# Patient Record
Sex: Male | Born: 1960 | Race: Black or African American | Hispanic: No | Marital: Single | Smoking: Current every day smoker
Health system: Southern US, Community
[De-identification: ages and names within clinical notes are randomized; demographics above are authoritative.]

## PROBLEM LIST (undated history)

## (undated) DIAGNOSIS — M1990 Unspecified osteoarthritis, unspecified site: Secondary | ICD-10-CM

## (undated) DIAGNOSIS — E789 Disorder of lipoprotein metabolism, unspecified: Secondary | ICD-10-CM

## (undated) DIAGNOSIS — K219 Gastro-esophageal reflux disease without esophagitis: Secondary | ICD-10-CM

## (undated) DIAGNOSIS — F32A Depression, unspecified: Secondary | ICD-10-CM

## (undated) DIAGNOSIS — F17200 Nicotine dependence, unspecified, uncomplicated: Secondary | ICD-10-CM

## (undated) DIAGNOSIS — I10 Essential (primary) hypertension: Secondary | ICD-10-CM

## (undated) HISTORY — PX: EYE SURGERY: SHX253

---

## 2010-12-14 ENCOUNTER — Emergency Department (HOSPITAL_COMMUNITY)
Admission: EM | Admit: 2010-12-14 | Discharge: 2010-12-14 | Disposition: A | Discharge status: Home / Self Care | Payer: No Typology Code available for payment source | Attending: Emergency Medicine | Admitting: Emergency Medicine

## 2010-12-14 ENCOUNTER — Emergency Department (HOSPITAL_COMMUNITY): Payer: Self-pay

## 2010-12-14 DIAGNOSIS — W108XXA Fall (on) (from) other stairs and steps, initial encounter: Secondary | ICD-10-CM | POA: Insufficient documentation

## 2010-12-14 DIAGNOSIS — M25579 Pain in unspecified ankle and joints of unspecified foot: Secondary | ICD-10-CM | POA: Insufficient documentation

## 2010-12-14 DIAGNOSIS — M542 Cervicalgia: Secondary | ICD-10-CM | POA: Insufficient documentation

## 2010-12-14 DIAGNOSIS — M546 Pain in thoracic spine: Secondary | ICD-10-CM | POA: Insufficient documentation

## 2010-12-14 DIAGNOSIS — M545 Low back pain, unspecified: Secondary | ICD-10-CM | POA: Insufficient documentation

## 2010-12-14 DIAGNOSIS — M25539 Pain in unspecified wrist: Secondary | ICD-10-CM | POA: Insufficient documentation

## 2010-12-14 DIAGNOSIS — T07XXXA Unspecified multiple injuries, initial encounter: Secondary | ICD-10-CM | POA: Insufficient documentation

## 2010-12-14 DIAGNOSIS — Z79899 Other long term (current) drug therapy: Secondary | ICD-10-CM | POA: Insufficient documentation

## 2010-12-14 DIAGNOSIS — S139XXA Sprain of joints and ligaments of unspecified parts of neck, initial encounter: Secondary | ICD-10-CM | POA: Insufficient documentation

## 2010-12-14 DIAGNOSIS — I10 Essential (primary) hypertension: Secondary | ICD-10-CM | POA: Insufficient documentation

## 2010-12-14 DIAGNOSIS — Y9229 Other specified public building as the place of occurrence of the external cause: Secondary | ICD-10-CM | POA: Insufficient documentation

## 2010-12-14 DIAGNOSIS — R51 Headache: Secondary | ICD-10-CM | POA: Insufficient documentation

## 2011-11-28 ENCOUNTER — Emergency Department (HOSPITAL_COMMUNITY): Payer: Medicaid Other

## 2011-11-28 ENCOUNTER — Emergency Department (HOSPITAL_COMMUNITY)
Admission: EM | Admit: 2011-11-28 | Discharge: 2011-11-28 | Disposition: A | Discharge status: Home / Self Care | Payer: Medicaid Other | Attending: Emergency Medicine | Admitting: Emergency Medicine

## 2011-11-28 ENCOUNTER — Encounter (HOSPITAL_COMMUNITY): Payer: Self-pay | Admitting: Emergency Medicine

## 2011-11-28 DIAGNOSIS — F17200 Nicotine dependence, unspecified, uncomplicated: Secondary | ICD-10-CM | POA: Insufficient documentation

## 2011-11-28 DIAGNOSIS — I10 Essential (primary) hypertension: Secondary | ICD-10-CM | POA: Insufficient documentation

## 2011-11-28 DIAGNOSIS — Y93I9 Activity, other involving external motion: Secondary | ICD-10-CM | POA: Insufficient documentation

## 2011-11-28 DIAGNOSIS — Y998 Other external cause status: Secondary | ICD-10-CM | POA: Insufficient documentation

## 2011-11-28 DIAGNOSIS — S139XXA Sprain of joints and ligaments of unspecified parts of neck, initial encounter: Secondary | ICD-10-CM

## 2011-11-28 DIAGNOSIS — M25519 Pain in unspecified shoulder: Secondary | ICD-10-CM | POA: Insufficient documentation

## 2011-11-28 HISTORY — DX: Essential (primary) hypertension: I10

## 2011-11-28 MED ORDER — CYCLOBENZAPRINE HCL 10 MG PO TABS: 10.0000 mg | ORAL_TABLET | Freq: Two times a day (BID) | ORAL | Status: DC | PRN
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
## 2011-11-28 NOTE — ED Provider Notes (Signed)
Medical screening examination/treatment/procedure(s) were performed by non-physician practitioner and as supervising physician I was immediately available for consultation/collaboration.   Celene Kras, MD 11/28/11 [phone]
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
## 2013-02-09 ENCOUNTER — Emergency Department (HOSPITAL_COMMUNITY)
Admission: EM | Admit: 2013-02-09 | Discharge: 2013-02-09 | Disposition: A | Discharge status: Home / Self Care | Payer: Medicaid Other | Attending: Emergency Medicine | Admitting: Emergency Medicine

## 2013-02-09 ENCOUNTER — Encounter (HOSPITAL_COMMUNITY): Payer: Self-pay | Admitting: Emergency Medicine

## 2013-02-09 DIAGNOSIS — L259 Unspecified contact dermatitis, unspecified cause: Secondary | ICD-10-CM

## 2013-02-09 DIAGNOSIS — IMO0002 Reserved for concepts with insufficient information to code with codable children: Secondary | ICD-10-CM | POA: Insufficient documentation

## 2013-02-09 DIAGNOSIS — F17200 Nicotine dependence, unspecified, uncomplicated: Secondary | ICD-10-CM | POA: Insufficient documentation

## 2013-02-09 DIAGNOSIS — Z79899 Other long term (current) drug therapy: Secondary | ICD-10-CM | POA: Insufficient documentation

## 2013-02-09 DIAGNOSIS — Z792 Long term (current) use of antibiotics: Secondary | ICD-10-CM | POA: Insufficient documentation

## 2013-02-09 DIAGNOSIS — I10 Essential (primary) hypertension: Secondary | ICD-10-CM | POA: Insufficient documentation

## 2013-02-09 MED ORDER — PREDNISONE 20 MG PO TABS: 40.0000 mg | ORAL_TABLET | Freq: Every day | ORAL | Status: DC

## 2013-02-09 MED ORDER — DIPHENHYDRAMINE HCL 25 MG PO CAPS
25.0000 mg | ORAL_CAPSULE | Freq: Once | ORAL | Status: AC
  Administered 2013-02-09: 25 mg via ORAL
  Filled 2013-02-09: qty 1

## 2013-02-09 MED ORDER — FAMOTIDINE 20 MG PO TABS
20.0000 mg | ORAL_TABLET | Freq: Once | ORAL | Status: AC
  Administered 2013-02-09: 20 mg via ORAL
  Filled 2013-02-09: qty 1

## 2013-02-09 MED ORDER — DIPHENHYDRAMINE HCL 25 MG PO TABS: 25.0000 mg | ORAL_TABLET | Freq: Four times a day (QID) | ORAL | Status: DC | PRN

## 2013-02-09 MED ORDER — PREDNISONE 20 MG PO TABS
60.0000 mg | ORAL_TABLET | Freq: Once | ORAL | Status: AC
  Administered 2013-02-09: 60 mg via ORAL
  Filled 2013-02-09: qty 3

## 2013-02-09 MED ORDER — HYDROCORTISONE 2.5 % EX LOTN: TOPICAL_LOTION | Freq: Two times a day (BID) | CUTANEOUS | Status: DC

## 2013-02-09 MED ORDER — CEPHALEXIN 500 MG PO CAPS: 500.0000 mg | ORAL_CAPSULE | Freq: Four times a day (QID) | ORAL | Status: DC

## 2013-02-09 MED ORDER — FAMOTIDINE 20 MG PO TABS: 20.0000 mg | ORAL_TABLET | Freq: Two times a day (BID) | ORAL | Status: DC

## 2013-02-09 NOTE — ED Notes (Signed)
Pt states he began to break out 6 days ago, after using some just for men dye. Pt states he had some burning and itching that has now spread to his neck, arms, and chest. Visible redness and hives noted. Pt states he has tried benadryl, hydrocortisone cream, and calamine lotion but nothing has relieved his sx.

## 2013-02-09 NOTE — ED Notes (Signed)
PT ambulated with baseline gait; VSS; A&Ox3; no signs of distress; respirations even and unlabored; skin warm and dry; no questions upon discharge.  

## 2013-02-09 NOTE — ED Provider Notes (Signed)
CSN: [id number]     Arrival date & time 02/09/13  1436 History  This chart was scribed for non-physician practitioner, Dierdre Forth, PA-C working with Raeford Razor, MD by Greggory Stallion, ED scribe. This patient was seen in room TR04C/TR04C and the patient's care was started at 3:50 PM.   Chief Complaint  Patient presents with  . Rash   The history is provided by the patient. No language interpreter was used.   HPI Comments: Jonathan Carney is a 52 y.o. male who presents to the Emergency Department complaining of worsening, itchy rash that started 4-5 days ago. He states it started on his face then spread to his neck, arms, abdomen and flanks. Pt states he recently started using Just For Men soap and used it for the first time 1 day before the rash began and each day he has used it the rash has worsened. He has used Benadryl cream, hydrocortisone cream and calamine lotion with no relief.   He denies fever, chills, nausea, vomiting, feeling of throat swelling, shortness of breath, difficulty breathing or wheezing.  Past Medical History  Diagnosis Date  . Hypertension    Past Surgical History  Procedure Laterality Date  . Eye surgery  to straighten it out    Right   No family history on file. History  Substance Use Topics  . Smoking status: Current Every Day Smoker -- 0.25 packs/day  . Smokeless tobacco: Not on file  . Alcohol Use: No    Review of Systems  Constitutional: Negative for fever, diaphoresis, appetite change, fatigue and unexpected weight change.  HENT: Negative for mouth sores.   Eyes: Negative for visual disturbance.  Respiratory: Negative for cough, chest tightness, shortness of breath and wheezing.   Cardiovascular: Negative for chest pain.  Gastrointestinal: Negative for nausea, vomiting, abdominal pain, diarrhea and constipation.  Endocrine: Negative for polydipsia, polyphagia and polyuria.  Genitourinary: Negative for dysuria, urgency, frequency and  hematuria.  Musculoskeletal: Negative for back pain and neck stiffness.  Skin: Positive for rash.  Allergic/Immunologic: Negative for immunocompromised state.  Neurological: Negative for syncope, light-headedness and headaches.  Hematological: Does not bruise/bleed easily.  Psychiatric/Behavioral: Negative for sleep disturbance. The patient is not nervous/anxious.     Allergies  Review of patient's allergies indicates no known allergies.  Home Medications   Current Outpatient Rx  Name  Route  Sig  Dispense  Refill  . ibuprofen (ADVIL,MOTRIN) 200 MG tablet   Oral   Take 600 mg by mouth every 6 (six) hours as needed for headache.         . cephALEXin (KEFLEX) 500 MG capsule   Oral   Take 1 capsule (500 mg total) by mouth 4 (four) times daily.   40 capsule   0   . diphenhydrAMINE (BENADRYL) 25 MG tablet   Oral   Take 1 tablet (25 mg total) by mouth every 6 (six) hours as needed for itching (Rash).   20 tablet   0   . famotidine (PEPCID) 20 MG tablet   Oral   Take 1 tablet (20 mg total) by mouth 2 (two) times daily.   10 tablet   0   . hydrocortisone 2.5 % lotion   Topical   Apply topically 2 (two) times daily.   59 mL   0   . predniSONE (DELTASONE) 20 MG tablet   Oral   Take 2 tablets (40 mg total) by mouth daily.   10 tablet   0    BP  122/77  Pulse 85  Temp(Src) 99.5 F (37.5 C) (Oral)  Resp 16  SpO2 97%  Physical Exam  Nursing note and vitals reviewed. Constitutional: He appears well-developed and well-nourished. No distress.  Awake, alert, nontoxic appearance  HENT:  Head: Normocephalic and atraumatic.  Mouth/Throat: Uvula is midline and oropharynx is clear and moist. No uvula swelling. No oropharyngeal exudate, posterior oropharyngeal edema, posterior oropharyngeal erythema or tonsillar abscesses.  No visible swelling of the oropharyngeal tissues or uvula Uvula is midline  Eyes: Conjunctivae are normal. No scleral icterus.  Neck: Normal range of  motion. Neck supple.  Patent airway Handling secretions No stridor  Cardiovascular: Normal rate, regular rhythm, normal heart sounds and intact distal pulses.   Pulmonary/Chest: Effort normal and breath sounds normal. No respiratory distress. He has no wheezes.  Clear and equal breath sounds without wheezing  Abdominal: Soft. Bowel sounds are normal. He exhibits no mass. There is no tenderness. There is no rebound and no guarding.  Musculoskeletal: Normal range of motion. He exhibits no edema.  Neurological: He is alert.  Speech is clear and goal oriented Moves extremities without ataxia  Skin: Skin is warm and dry. Rash noted. He is not diaphoretic. There is erythema.  Erythematous, raised papular rash with wheals over the bilateral cheeks, neck and bilateral axilla. Excoriated throughout. There is a 1 x 1 cm area of induration to the left anterior neck which is draining without an area of fluctuance.  Psychiatric: He has a normal mood and affect. His behavior is normal.    ED Course  Procedures (including critical care time)  DIAGNOSTIC STUDIES: Oxygen Saturation is 97% on RA, normal by my interpretation.    COORDINATION OF CARE: 3:54 PM-Discussed treatment plan which includes Benadryl, pepcid and prednisone with pt at bedside and pt agreed to plan.   Labs Review Labs Reviewed - No data to display Imaging Review No results found.  EKG Interpretation   None       MDM   1. Contact dermatitis      Amedee Mah presents with history and physical consistent with contact dermatitis.  Rash consistent with contact dermatitis. Patient denies any difficulty breathing or swallowing.  Pt has a patent airway without stridor and is handling secretions without difficulty; no angioedema. No concern for superimposed infection. No concern for SJS, TEN, TSS, tick borne illness, syphilis or other life-threatening condition. Will discharge home with short course of steroids and recommend  Benadryl as needed for pruritis.  It has been determined that no acute conditions requiring further emergency intervention are present at this time. The patient/guardian have been advised of the diagnosis and plan. We have discussed signs and symptoms that warrant return to the ED, such as changes or worsening in symptoms.   Vital signs are stable at discharge.   BP 122/77  Pulse 85  Temp(Src) 99.5 F (37.5 C) (Oral)  Resp 16  SpO2 97%  Patient/guardian has voiced understanding and agreed to follow-up with the PCP or specialist.       Dierdre Forth, PA-C 02/09/13 1655

## 2013-02-09 NOTE — ED Notes (Signed)
Pt c/o rash starting on face and spreading to arms and abdomen after dying beard.

## 2013-02-15 NOTE — ED Provider Notes (Signed)
Medical screening examination/treatment/procedure(s) were performed by non-physician practitioner and as supervising physician I was immediately available for consultation/collaboration.  EKG Interpretation   None        Kelilah Hebard, MD 02/15/13 1337 

## 2013-02-26 ENCOUNTER — Emergency Department (HOSPITAL_COMMUNITY)
Admission: EM | Admit: 2013-02-26 | Discharge: 2013-02-26 | Disposition: A | Discharge status: Home / Self Care | Payer: Medicaid Other | Attending: Emergency Medicine | Admitting: Emergency Medicine

## 2013-02-26 ENCOUNTER — Encounter (HOSPITAL_COMMUNITY): Payer: Self-pay | Admitting: Emergency Medicine

## 2013-02-26 ENCOUNTER — Emergency Department (HOSPITAL_COMMUNITY): Payer: Medicaid Other

## 2013-02-26 DIAGNOSIS — S46909A Unspecified injury of unspecified muscle, fascia and tendon at shoulder and upper arm level, unspecified arm, initial encounter: Secondary | ICD-10-CM | POA: Insufficient documentation

## 2013-02-26 DIAGNOSIS — I10 Essential (primary) hypertension: Secondary | ICD-10-CM | POA: Insufficient documentation

## 2013-02-26 DIAGNOSIS — Y92410 Unspecified street and highway as the place of occurrence of the external cause: Secondary | ICD-10-CM | POA: Insufficient documentation

## 2013-02-26 DIAGNOSIS — IMO0002 Reserved for concepts with insufficient information to code with codable children: Secondary | ICD-10-CM | POA: Insufficient documentation

## 2013-02-26 DIAGNOSIS — Z792 Long term (current) use of antibiotics: Secondary | ICD-10-CM | POA: Insufficient documentation

## 2013-02-26 DIAGNOSIS — Z79899 Other long term (current) drug therapy: Secondary | ICD-10-CM | POA: Insufficient documentation

## 2013-02-26 DIAGNOSIS — S4980XA Other specified injuries of shoulder and upper arm, unspecified arm, initial encounter: Secondary | ICD-10-CM | POA: Insufficient documentation

## 2013-02-26 DIAGNOSIS — M549 Dorsalgia, unspecified: Secondary | ICD-10-CM

## 2013-02-26 DIAGNOSIS — F17200 Nicotine dependence, unspecified, uncomplicated: Secondary | ICD-10-CM | POA: Insufficient documentation

## 2013-02-26 DIAGNOSIS — M542 Cervicalgia: Secondary | ICD-10-CM

## 2013-02-26 DIAGNOSIS — Y9389 Activity, other specified: Secondary | ICD-10-CM | POA: Insufficient documentation

## 2013-02-26 DIAGNOSIS — S0993XA Unspecified injury of face, initial encounter: Secondary | ICD-10-CM | POA: Insufficient documentation

## 2013-02-26 MED ORDER — TRAMADOL HCL 50 MG PO TABS: 50.0000 mg | ORAL_TABLET | Freq: Four times a day (QID) | ORAL | Status: DC | PRN

## 2013-02-26 MED ORDER — METHOCARBAMOL 500 MG PO TABS
500.0000 mg | ORAL_TABLET | Freq: Two times a day (BID) | ORAL | Status: DC
Start: 2013-02-26 — End: 2015-02-01

## 2013-02-26 MED ORDER — METHOCARBAMOL 500 MG PO TABS
500.0000 mg | ORAL_TABLET | Freq: Once | ORAL | Status: AC
  Administered 2013-02-26: 500 mg via ORAL
  Filled 2013-02-26: qty 1

## 2013-02-26 MED ORDER — TRAMADOL HCL 50 MG PO TABS
50.0000 mg | ORAL_TABLET | Freq: Once | ORAL | Status: AC
  Administered 2013-02-26: 50 mg via ORAL
  Filled 2013-02-26: qty 1

## 2013-02-26 MED ORDER — NAPROXEN 500 MG PO TABS: 500.0000 mg | ORAL_TABLET | Freq: Two times a day (BID) | ORAL | Status: DC
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
## 2013-03-05 NOTE — ED Provider Notes (Signed)
Medical screening examination/treatment/procedure(s) were performed by non-physician practitioner and as supervising physician I was immediately available for consultation/collaboration.   Oliver Neuwirth L Jazsmine Macari, MD 03/05/13 2059 

## 2013-12-13 ENCOUNTER — Emergency Department (HOSPITAL_COMMUNITY)
Admission: EM | Admit: 2013-12-13 | Discharge: 2013-12-13 | Disposition: A | Discharge status: Home / Self Care | Payer: Medicaid Other | Source: Home / Self Care | Attending: Family Medicine | Admitting: Family Medicine

## 2013-12-13 ENCOUNTER — Encounter (HOSPITAL_COMMUNITY): Payer: Self-pay | Admitting: Emergency Medicine

## 2013-12-13 DIAGNOSIS — B86 Scabies: Secondary | ICD-10-CM

## 2013-12-13 MED ORDER — PERMETHRIN 5 % EX CREA: TOPICAL_CREAM | CUTANEOUS | Status: DC
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
## 2013-12-13 NOTE — ED Notes (Signed)
States he and his lady friend spent night in a local hotel over the weekend, and since then has noticed several irritated areas on his skin, concerned about bug bites

## 2013-12-13 NOTE — Discharge Instructions (Signed)

## 2013-12-15 NOTE — ED Provider Notes (Signed)
Medical screening examination/treatment/procedure(s) were performed by a resident physician or non-physician practitioner and as the supervising physician I was immediately available for consultation/collaboration.  Mariaguadalupe Fialkowski, MD    Braxon Suder S Akaysha Cobern, MD 12/15/13 0818 

## 2014-09-06 ENCOUNTER — Encounter (HOSPITAL_COMMUNITY): Payer: Self-pay | Admitting: Emergency Medicine

## 2014-09-06 ENCOUNTER — Emergency Department (HOSPITAL_COMMUNITY)
Admission: EM | Admit: 2014-09-06 | Discharge: 2014-09-06 | Disposition: A | Discharge status: Home / Self Care | Payer: Medicaid Other | Attending: Emergency Medicine | Admitting: Emergency Medicine

## 2014-09-06 DIAGNOSIS — Z791 Long term (current) use of non-steroidal anti-inflammatories (NSAID): Secondary | ICD-10-CM | POA: Insufficient documentation

## 2014-09-06 DIAGNOSIS — Z720 Tobacco use: Secondary | ICD-10-CM | POA: Diagnosis not present

## 2014-09-06 DIAGNOSIS — H1033 Unspecified acute conjunctivitis, bilateral: Secondary | ICD-10-CM | POA: Diagnosis not present

## 2014-09-06 DIAGNOSIS — I10 Essential (primary) hypertension: Secondary | ICD-10-CM | POA: Diagnosis not present

## 2014-09-06 DIAGNOSIS — Z7982 Long term (current) use of aspirin: Secondary | ICD-10-CM | POA: Insufficient documentation

## 2014-09-06 DIAGNOSIS — Z7952 Long term (current) use of systemic steroids: Secondary | ICD-10-CM | POA: Insufficient documentation

## 2014-09-06 DIAGNOSIS — H5712 Ocular pain, left eye: Secondary | ICD-10-CM | POA: Diagnosis present

## 2014-09-06 DIAGNOSIS — Z79899 Other long term (current) drug therapy: Secondary | ICD-10-CM | POA: Diagnosis not present

## 2014-09-06 MED ORDER — NEOMYCIN-POLYMYXIN-DEXAMETH 3.5-10000-0.1 OP OINT
TOPICAL_OINTMENT | Freq: Four times a day (QID) | OPHTHALMIC | Status: DC
  Administered 2014-09-06: 11:00:00 via OPHTHALMIC
  Filled 2014-09-06: qty 3.5

## 2014-09-06 MED ORDER — FLUORESCEIN SODIUM 1 MG OP STRP
1.0000 | ORAL_STRIP | Freq: Once | OPHTHALMIC | Status: AC
  Administered 2014-09-06: 1 via OPHTHALMIC
  Filled 2014-09-06: qty 1

## 2014-09-06 MED ORDER — TETRACAINE HCL 0.5 % OP SOLN
2.0000 [drp] | Freq: Once | OPHTHALMIC | Status: AC
  Administered 2014-09-06: 2 [drp] via OPHTHALMIC
  Filled 2014-09-06: qty 2

## 2014-09-06 NOTE — ED Provider Notes (Signed)
CSN: [id number]     Arrival date & time 09/06/14  0841 History  This chart was scribed for non-physician practitioner Jaynie Crumble, PA-C working with Purvis Sheffield, MD by Littie Deeds, ED Scribe. This patient was seen in room TR04C/TR04C and the patient's care was started at 9:13 AM.     Chief Complaint  Patient presents with  . Conjunctivitis   HPI  HPI Comments: Jonathan Carney is a 54 y.o. male who presents to the Emergency Department complaining of left eye pain with associated erythema, itchiness, and drainage that started about a week ago. Patient notes his right eye started having similar symptoms a few days ago. He has been waking up with crusty eye discharge. He reports having associated blurred vision. Patient has been using visine eye drops. He believes he may have had chemical exposure to his eyes; he sprays his car with [employer] Hot [employer]ine every day, and his eyes may have been exposed to some of the mist spray.   Past Medical History  Diagnosis Date  . Hypertension    Past Surgical History  Procedure Laterality Date  . Eye surgery  to straighten it out    Right   No family history on file. History  Substance Use Topics  . Smoking status: Current Every Day Smoker -- 0.50 packs/day    Types: Cigarettes  . Smokeless tobacco: Not on file  . Alcohol Use: No    Review of Systems  Eyes: Positive for pain, discharge, redness, itching and visual disturbance.      Allergies  Review of patient's allergies indicates no known allergies.  Home Medications   Prior to Admission medications   Medication Sig Start Date End Date Taking? Authorizing Provider  aspirin 325 MG tablet Take 325 mg by mouth daily as needed for headache. Patient took 3 tabs today 02/26/13    Historical Provider, MD  diphenhydrAMINE (BENADRYL) 25 MG tablet Take 1 tablet (25 mg total) by mouth every 6 (six) hours as needed for itching (Rash). 02/09/13   Hannah Muthersbaugh, PA-C  famotidine (PEPCID)  20 MG tablet Take 1 tablet (20 mg total) by mouth 2 (two) times daily. 02/09/13   Hannah Muthersbaugh, PA-C  hydrocortisone 2.5 % lotion Apply topically 2 (two) times daily. 02/09/13   Hannah Muthersbaugh, PA-C  methocarbamol (ROBAXIN) 500 MG tablet Take 1 tablet (500 mg total) by mouth 2 (two) times daily. 02/26/13   Santiago Glad, PA-C  Multiple Vitamin (MULTIVITAMIN WITH MINERALS) TABS tablet Take 1 tablet by mouth daily.    Historical Provider, MD  naproxen (NAPROSYN) 500 MG tablet Take 1 tablet (500 mg total) by mouth 2 (two) times daily. 02/26/13   Heather Laisure, PA-C  permethrin (ELIMITE) 5 % cream Apply to affected area once, leave 8 hours then shower Repeat treatment in one week. 12/13/13   Mathis Fare Presson, PA  predniSONE (DELTASONE) 20 MG tablet Take 2 tablets (40 mg total) by mouth daily. 02/09/13   Hannah Muthersbaugh, PA-C  traMADol (ULTRAM) 50 MG tablet Take 1 tablet (50 mg total) by mouth every 6 (six) hours as needed. 02/26/13   Heather Laisure, PA-C   BP 138/91 mmHg  Pulse 82  Temp(Src) 98.3 F (36.8 C) (Oral)  Resp 20  SpO2 98% Physical Exam  Constitutional: He is oriented to person, place, and time. He appears well-developed and well-nourished. No distress.  HENT:  Head: Normocephalic and atraumatic.  Mouth/Throat: Oropharynx is clear and moist. No oropharyngeal exudate.  Eyes: EOM are normal. Pupils  are equal, round, and reactive to light. Right eye exhibits discharge. Right eye exhibits no hordeolum. No foreign body present in the right eye. Left eye exhibits discharge. Left eye exhibits no hordeolum. No foreign body present in the left eye.  Slit lamp exam:      The right eye shows no corneal abrasion, no hyphema and no fluorescein uptake.       The left eye shows no corneal abrasion, no hyphema and no fluorescein uptake.  Conjunctivae injected bilaterally, pupils are equal round reactive. No sensitivity to light. Watery/purulent discharge bilaterally  Neck:  Neck supple.  Cardiovascular: Normal rate.   Pulmonary/Chest: Effort normal.  Musculoskeletal: He exhibits no edema.  Neurological: He is alert and oriented to person, place, and time. No cranial nerve deficit.  Skin: Skin is warm and dry. No rash noted.  Psychiatric: He has a normal mood and affect. His behavior is normal.  Nursing note and vitals reviewed.   ED Course  Procedures  DIAGNOSTIC STUDIES: Oxygen Saturation is 98% on room air, normal by my interpretation.    COORDINATION OF CARE: 9:16 AM-Will administer fluorescein eye stain and have visual acuity screening performed.   Labs Review Labs Reviewed - No data to display  Imaging Review No results found.   EKG Interpretation None      MDM   Final diagnoses:  Conjunctivitis, acute, bilateral   patient with bilateral conjunctivitis, viral versus bacterial. No fluorescein uptake, corneal abrasions, corneal ulcers on fluorescein staining. I appears otherwise normal. There is no consensual or direct photophobia. Less likely conjunctivitis. Will treat with antibiotics drops, follow-up as needed. Patient requested follow-up with ophthalmology, stated that he needs to get his eyes checked because he cannot see as well. His visual acuities 20/30 here.  Filed Vitals:   09/06/14 0847  BP: 138/91  Pulse: 82  Temp: 98.3 F (36.8 C)  TempSrc: Oral  Resp: 20  SpO2: 98%   I personally performed the services described in this documentation, which was scribed in my presence. The recorded information has been reviewed and is accurate.    Jaynie Crumbleatyana Juanjesus Pepperman, PA-C 09/06/14 1003  Purvis SheffieldForrest Harrison, MD 09/06/14 1558

## 2014-09-06 NOTE — ED Notes (Signed)
Waiting on medicine from pharmacy.   Notified pharmacy that we need the medicine.

## 2014-09-06 NOTE — Discharge Instructions (Signed)
Tylenol or motrin for pain. Use neomycin drops every 6 hrs. Follow up with primary care doctor. Return if worsening.    Conjunctivitis Conjunctivitis is commonly called "pink eye." Conjunctivitis can be caused by bacterial or viral infection, allergies, or injuries. There is usually redness of the lining of the eye, itching, discomfort, and sometimes discharge. There may be deposits of matter along the eyelids. A viral infection usually causes a watery discharge, while a bacterial infection causes a yellowish, thick discharge. Pink eye is very contagious and spreads by direct contact. You may be given antibiotic eyedrops as part of your treatment. Before using your eye medicine, remove all drainage from the eye by washing gently with warm water and cotton balls. Continue to use the medication until you have awakened 2 mornings in a row without discharge from the eye. Do not rub your eye. This increases the irritation and helps spread infection. Use separate towels from other household members. Wash your hands with soap and water before and after touching your eyes. Use cold compresses to reduce pain and sunglasses to relieve irritation from light. Do not wear contact lenses or wear eye makeup until the infection is gone. SEEK MEDICAL CARE IF:   Your symptoms are not better after 3 days of treatment.  You have increased pain or trouble seeing.  The outer eyelids become very red or swollen. Document Released: 04/02/2004 Document Revised: 05/18/2011 Document Reviewed: 02/23/2005 [facility] Patient Information 2015 [place], [place]. This information is not intended to replace advice given to you by your health care provider. Make sure you discuss any questions you have with your health care provider.

## 2014-09-06 NOTE — ED Notes (Signed)
Patient still waiting in room for drops.   Pharmacy called again, tube station problems.   I asked if they would bring and they said they would as soon as someone available to bring.

## 2014-09-06 NOTE — ED Notes (Signed)
Patient states eye redness and drainage in L eye started 1 wk ago.   Patient states R eye started a day or two ago.

## 2015-02-01 ENCOUNTER — Encounter (HOSPITAL_COMMUNITY): Payer: Self-pay | Admitting: Emergency Medicine

## 2015-02-01 ENCOUNTER — Emergency Department (HOSPITAL_COMMUNITY)
Admission: EM | Admit: 2015-02-01 | Discharge: 2015-02-01 | Disposition: A | Discharge status: Home / Self Care | Payer: Medicaid Other | Attending: Emergency Medicine | Admitting: Emergency Medicine

## 2015-02-01 DIAGNOSIS — Z791 Long term (current) use of non-steroidal anti-inflammatories (NSAID): Secondary | ICD-10-CM | POA: Diagnosis not present

## 2015-02-01 DIAGNOSIS — Z79899 Other long term (current) drug therapy: Secondary | ICD-10-CM | POA: Diagnosis not present

## 2015-02-01 DIAGNOSIS — R21 Rash and other nonspecific skin eruption: Secondary | ICD-10-CM | POA: Diagnosis present

## 2015-02-01 DIAGNOSIS — L259 Unspecified contact dermatitis, unspecified cause: Secondary | ICD-10-CM | POA: Diagnosis not present

## 2015-02-01 DIAGNOSIS — F17210 Nicotine dependence, cigarettes, uncomplicated: Secondary | ICD-10-CM | POA: Diagnosis not present

## 2015-02-01 DIAGNOSIS — M65312 Trigger thumb, left thumb: Secondary | ICD-10-CM | POA: Diagnosis not present

## 2015-02-01 DIAGNOSIS — Z7982 Long term (current) use of aspirin: Secondary | ICD-10-CM | POA: Insufficient documentation

## 2015-02-01 DIAGNOSIS — I10 Essential (primary) hypertension: Secondary | ICD-10-CM | POA: Diagnosis not present

## 2015-02-01 DIAGNOSIS — Z7952 Long term (current) use of systemic steroids: Secondary | ICD-10-CM | POA: Diagnosis not present

## 2015-02-01 MED ORDER — LORAZEPAM 1 MG PO TABS: 1.0000 mg | ORAL_TABLET | Freq: Every evening | ORAL | Status: DC | PRN

## 2015-02-01 MED ORDER — PREDNISONE 20 MG PO TABS
60.0000 mg | ORAL_TABLET | Freq: Once | ORAL | Status: AC
  Administered 2015-02-01: 60 mg via ORAL
  Filled 2015-02-01: qty 3

## 2015-02-01 MED ORDER — LORATADINE 10 MG PO TABS: 10.0000 mg | ORAL_TABLET | Freq: Every day | ORAL | Status: DC

## 2015-02-01 MED ORDER — PREDNISONE 50 MG PO TABS: 50.0000 mg | ORAL_TABLET | Freq: Every day | ORAL | Status: DC

## 2015-02-01 NOTE — Discharge Instructions (Signed)
Take diphenhydramine (Benadryl) as needed for itching. Do not ever use [employer] again. If you use anything to color your hair/beard, check to make sure it does not have the same ingredients as [employer].  Contact Dermatitis Dermatitis is redness, soreness, and swelling (inflammation) of the skin. Contact dermatitis is a reaction to certain substances that touch the skin. There are two types of contact dermatitis:   Irritant contact dermatitis. This type is caused by something that irritates your skin, such as dry hands from washing them too much. This type does not require previous exposure to the substance for a reaction to occur. This type is more common.  Allergic contact dermatitis. This type is caused by a substance that you are allergic to, such as a nickel allergy or poison ivy. This type only occurs if you have been exposed to the substance (allergen) before. Upon a repeat exposure, your body reacts to the substance. This type is less common. CAUSES  Many different substances can cause contact dermatitis. Irritant contact dermatitis is most commonly caused by exposure to:   Makeup.   Soaps.   Detergents.   Bleaches.   Acids.   Metal salts, such as nickel.  Allergic contact dermatitis is most commonly caused by exposure to:   Poisonous plants.   Chemicals.   Jewelry.   Latex.   Medicines.   Preservatives in products, such as clothing.  RISK FACTORS This condition is more likely to develop in:   People who have jobs that expose them to irritants or allergens.  People who have certain medical conditions, such as asthma or eczema.  SYMPTOMS  Symptoms of this condition may occur anywhere on your body where the irritant has touched you or is touched by you. Symptoms include:  Dryness or flaking.   Redness.   Cracks.   Itching.   Pain or a burning feeling.   Blisters.  Drainage of small amounts of blood or clear fluid from skin  cracks. With allergic contact dermatitis, there may also be swelling in areas such as the eyelids, mouth, or genitals.  DIAGNOSIS  This condition is diagnosed with a medical history and physical exam. A patch skin test may be performed to help determine the cause. If the condition is related to your job, you may need to see an occupational medicine specialist. TREATMENT Treatment for this condition includes figuring out what caused the reaction and protecting your skin from further contact. Treatment may also include:   Steroid creams or ointments. Oral steroid medicines may be needed in more severe cases.  Antibiotics or antibacterial ointments, if a skin infection is present.  Antihistamine lotion or an antihistamine taken by mouth to ease itching.  A bandage (dressing). HOME CARE INSTRUCTIONS Skin Care  Moisturize your skin as needed.   Apply cool compresses to the affected areas.  Try taking a bath with:  Epsom salts. Follow the instructions on the packaging. You can get these at your local pharmacy or grocery store.  Baking soda. Pour a small amount into the bath as directed by your health care provider.  Colloidal oatmeal. Follow the instructions on the packaging. You can get this at your local pharmacy or grocery store.  Try applying baking soda paste to your skin. Stir water into baking soda until it reaches a paste-like consistency.  Do not scratch your skin.  Bathe less frequently, such as every other day.  Bathe in lukewarm water. Avoid using hot water. Medicines  Take or  apply over-the-counter and prescription medicines only as told by your health care provider.   If you were prescribed an antibiotic medicine, take or apply your antibiotic as told by your health care provider. Do not stop using the antibiotic even if your condition starts to improve. General Instructions  Keep all follow-up visits as told by your health care provider. This is  important.  Avoid the substance that caused your reaction. If you do not know what caused it, keep a journal to try to track what caused it. Write down:  What you eat.  What cosmetic products you use.  What you drink.  What you wear in the affected area. This includes jewelry.  If you were given a dressing, take care of it as told by your health care provider. This includes when to change and remove it. SEEK MEDICAL CARE IF:   Your condition does not improve with treatment.  Your condition gets worse.  You have signs of infection such as swelling, tenderness, redness, soreness, or warmth in the affected area.  You have a fever.  You have new symptoms. SEEK IMMEDIATE MEDICAL CARE IF:   You have a severe headache, neck pain, or neck stiffness.  You vomit.  You feel very sleepy.  You notice red streaks coming from the affected area.  Your bone or joint underneath the affected area becomes painful after the skin has healed.  The affected area turns darker.  You have difficulty breathing.   This information is not intended to replace advice given to you by your health care provider. Make sure you discuss any questions you have with your health care provider.   Document Released: 02/21/2000 Document Revised: 11/14/2014 Document Reviewed: 07/11/2014 Elsevier Interactive Patient Education 2016 [employer]  Trigger Finger Trigger finger (digital tendinitis and stenosing tenosynovitis) is a common disorder that causes an often painful catching of the fingers or thumb. It occurs as a clicking, snapping, or locking of a finger in the palm of the hand. This is caused by a problem with the tendons that flex or bend the fingers sliding smoothly through their sheaths. The condition may occur in any finger or a couple fingers at the same time.  The finger may lock with the finger curled or suddenly straighten out with a snap. This is more common in patients with rheumatoid arthritis  and diabetes. Left untreated, the condition may get worse to the point where the finger becomes locked in flexion, like making a fist, or less commonly locked with the finger straightened out. CAUSES   Inflammation and scarring that lead to swelling around the tendon sheath.  Repeated or forceful movements.  Rheumatoid arthritis, an autoimmune disease that affects joints.  Gout.  Diabetes mellitus. SIGNS AND SYMPTOMS  Soreness and swelling of your finger.  A painful clicking or snapping as you bend and straighten your finger. DIAGNOSIS  Your health care provider will do a physical exam of your finger to diagnose trigger finger. TREATMENT   Splinting for 6-8 weeks may be helpful.  Nonsteroidal anti-inflammatory medicines (NSAIDs) can help to relieve the pain and inflammation.  Cortisone injections, along with splinting, may speed up recovery. Several injections may be required. Cortisone may give relief after one injection.  Surgery is another treatment that may be used if conservative treatments do not work. Surgery can be minor, without incisions (a cut does not have to be made), and can be done with a needle through the skin.  Other surgical choices involve an  open procedure in which the surgeon opens the hand through a small incision and cuts the pulley so the tendon can again slide smoothly. Your hand will still work fine. HOME CARE INSTRUCTIONS  Apply ice to the injured area, twice per day:  Put ice in a plastic bag.  Place a towel between your skin and the bag.  Leave the ice on for 20 minutes, 3-4 times a day.  Rest your hand often. MAKE SURE YOU:   Understand these instructions.  Will watch your condition.  Will get help right away if you are not doing well or get worse.   This information is not intended to replace advice given to you by your health care provider. Make sure you discuss any questions you have with your health care provider.   Document  Released: 12/14/2003 Document Revised: 10/26/2012 Document Reviewed: 07/26/2012 Elsevier Interactive Patient Education 2016 [employer]  Prednisone tablets What is this medicine? PREDNISONE (PRED ni sone) is a corticosteroid. It is commonly used to treat inflammation of the skin, joints, lungs, and other organs. Common conditions treated include asthma, allergies, and arthritis. It is also used for other conditions, such as blood disorders and diseases of the adrenal glands. This medicine may be used for other purposes; ask your health care provider or pharmacist if you have questions. What should I tell my health care provider before I take this medicine? They need to know if you have any of these conditions: -Cushing's syndrome -diabetes -glaucoma -heart disease -high blood pressure -infection (especially a virus infection such as chickenpox, cold sores, or herpes) -kidney disease -liver disease -mental illness -myasthenia gravis -osteoporosis -seizures -stomach or intestine problems -thyroid disease -an unusual or allergic reaction to lactose, prednisone, other medicines, foods, dyes, or preservatives -pregnant or trying to get pregnant -breast-feeding How should I use this medicine? Take this medicine by mouth with a glass of water. Follow the directions on the prescription label. Take this medicine with food. If you are taking this medicine once a day, take it in the morning. Do not take more medicine than you are told to take. Do not suddenly stop taking your medicine because you may develop a severe reaction. Your doctor will tell you how much medicine to take. If your doctor wants you to stop the medicine, the dose may be slowly lowered over time to avoid any side effects. Talk to your pediatrician regarding the use of this medicine in children. Special care may be needed. Overdosage: If you think you have taken too much of this medicine contact a poison control center or  emergency room at once. NOTE: This medicine is only for you. Do not share this medicine with others. What if I miss a dose? If you miss a dose, take it as soon as you can. If it is almost time for your next dose, talk to your doctor or health care professional. You may need to miss a dose or take an extra dose. Do not take double or extra doses without advice. What may interact with this medicine? Do not take this medicine with any of the following medications: -metyrapone -mifepristone This medicine may also interact with the following medications: -aminoglutethimide -amphotericin B -aspirin and aspirin-like medicines -barbiturates -certain medicines for diabetes, like glipizide or glyburide -cholestyramine -cholinesterase inhibitors -cyclosporine -digoxin -diuretics -ephedrine -male hormones, like estrogens and birth control pills -isoniazid -ketoconazole -NSAIDS, medicines for pain and inflammation, like ibuprofen or naproxen -phenytoin -rifampin -toxoids -vaccines -warfarin This list may not describe  all possible interactions. Give your health care provider a list of all the medicines, herbs, non-prescription drugs, or dietary supplements you use. Also tell them if you smoke, drink alcohol, or use illegal drugs. Some items may interact with your medicine. What should I watch for while using this medicine? Visit your doctor or health care professional for regular checks on your progress. If you are taking this medicine over a prolonged period, carry an identification card with your name and address, the type and dose of your medicine, and your doctor's name and address. This medicine may increase your risk of getting an infection. Tell your doctor or health care professional if you are around anyone with measles or chickenpox, or if you develop sores or blisters that do not heal properly. If you are going to have surgery, tell your doctor or health care professional that you  have taken this medicine within the last twelve months. Ask your doctor or health care professional about your diet. You may need to lower the amount of salt you eat. This medicine may affect blood sugar levels. If you have diabetes, check with your doctor or health care professional before you change your diet or the dose of your diabetic medicine. What side effects may I notice from receiving this medicine? Side effects that you should report to your doctor or health care professional as soon as possible: -allergic reactions like skin rash, itching or hives, swelling of the face, lips, or tongue -changes in emotions or moods -changes in vision -depressed mood -eye pain -fever or chills, cough, sore throat, pain or difficulty passing urine -increased thirst -swelling of ankles, feet Side effects that usually do not require medical attention (report to your doctor or health care professional if they continue or are bothersome): -confusion, excitement, restlessness -headache -nausea, vomiting -skin problems, acne, thin and shiny skin -trouble sleeping -weight gain This list may not describe all possible side effects. Call your doctor for medical advice about side effects. You may report side effects to FDA at 1-800-FDA-1088. Where should I keep my medicine? Keep out of the reach of children. Store at room temperature between 15 and 30 degrees C (59 and 86 degrees F). Protect from light. Keep container tightly closed. Throw away any unused medicine after the expiration date. NOTE: This sheet is a summary. It may not cover all possible information. If you have questions about this medicine, talk to your doctor, pharmacist, or health care provider.    2016, Elsevier/[employer] Standard. (2010-10-09 10:57:14)  Loratadine tablets What is this medicine? LORATADINE (lor AT a deen) is an antihistamine. It helps to relieve sneezing, runny nose, and itchy, watery eyes. This medicine is used to treat  the symptoms of allergies. It is also used to treat itchy skin rash and hives. This medicine may be used for other purposes; ask your health care provider or pharmacist if you have questions. What should I tell my health care provider before I take this medicine? They need to know if you have any of these conditions: -asthma -kidney disease -liver disease -an unusual or allergic reaction to loratadine, other antihistamines, other medicines, foods, dyes, or preservatives -pregnant or trying to get pregnant -breast-feeding How should I use this medicine? Take this medicine by mouth with a glass of water. Follow the directions on the label. You may take this medicine with food or on an empty stomach. Take your medicine at regular intervals. Do not take your medicine more often than directed. Talk to your  pediatrician regarding the use of this medicine in children. While this medicine may be used in children as young as 6 years for selected conditions, precautions do apply. Overdosage: If you think you have taken too much of this medicine contact a poison control center or emergency room at once. NOTE: This medicine is only for you. Do not share this medicine with others. What if I miss a dose? If you miss a dose, take it as soon as you can. If it is almost time for your next dose, take only that dose. Do not take double or extra doses. What may interact with this medicine? -other medicines for colds or allergies This list may not describe all possible interactions. Give your health care provider a list of all the medicines, herbs, non-prescription drugs, or dietary supplements you use. Also tell them if you smoke, drink alcohol, or use illegal drugs. Some items may interact with your medicine. What should I watch for while using this medicine? Tell your doctor or healthcare professional if your symptoms do not start to get better or if they get worse. Your mouth may get dry. Chewing sugarless gum  or sucking hard candy, and drinking plenty of water may help. Contact your doctor if the problem does not go away or is severe. You may get drowsy or dizzy. Do not drive, use machinery, or do anything that needs mental alertness until you know how this medicine affects you. Do not stand or sit up quickly, especially if you are an older patient. This reduces the risk of dizzy or fainting spells. What side effects may I notice from receiving this medicine? Side effects that you should report to your doctor or health care professional as soon as possible: -allergic reactions like skin rash, itching or hives, swelling of the face, lips, or tongue -breathing problems -unusually restless or nervous Side effects that usually do not require medical attention (report to your doctor or health care professional if they continue or are bothersome): -drowsiness -dry or irritated mouth or throat -headache This list may not describe all possible side effects. Call your doctor for medical advice about side effects. You may report side effects to FDA at 1-800-FDA-1088. Where should I keep my medicine? Keep out of the reach of children. Store at room temperature between 2 and 30 degrees C (36 and 86 degrees F). Protect from moisture. Throw away any unused medicine after the expiration date. NOTE: This sheet is a summary. It may not cover all possible information. If you have questions about this medicine, talk to your doctor, pharmacist, or health care provider.    2016, [employer] Standard. (2007-08-29 17:17:24)  Lorazepam tablets What is this medicine? LORAZEPAM (lor A ze pam) is a benzodiazepine. It is used to treat anxiety. This medicine may be used for other purposes; ask your health care provider or pharmacist if you have questions. What should I tell my health care provider before I take this medicine? They need to know if you have any of these conditions: -alcohol or drug abuse problem -bipolar  disorder, depression, psychosis or other mental health condition -glaucoma -kidney or liver disease -lung disease or breathing difficulties -myasthenia gravis -Parkinson's disease -seizures or a history of seizures -suicidal thoughts -an unusual or allergic reaction to lorazepam, other benzodiazepines, foods, dyes, or preservatives -pregnant or trying to get pregnant -breast-feeding How should I use this medicine? Take this medicine by mouth with a glass of water. Follow the directions on the prescription label. If it  upsets your stomach, take it with food or milk. Take your medicine at regular intervals. Do not take it more often than directed. Do not stop taking except on the advice of your doctor or health care professional. Talk to your pediatrician regarding the use of this medicine in children. Special care may be needed. Overdosage: If you think you have taken too much of this medicine contact a poison control center or emergency room at once. NOTE: This medicine is only for you. Do not share this medicine with others. What if I miss a dose? If you miss a dose, take it as soon as you can. If it is almost time for your next dose, take only that dose. Do not take double or extra doses. What may interact with this medicine? -barbiturate medicines for inducing sleep or treating seizures, like phenobarbital -clozapine -medicines for depression, mental problems or psychiatric disturbances -medicines for sleep -phenytoin -probenecid -theophylline -valproic acid This list may not describe all possible interactions. Give your health care provider a list of all the medicines, herbs, non-prescription drugs, or dietary supplements you use. Also tell them if you smoke, drink alcohol, or use illegal drugs. Some items may interact with your medicine. What should I watch for while using this medicine? Visit your doctor or health care professional for regular checks on your progress. Your body may  become dependent on this medicine, ask your doctor or health care professional if you still need to take it. However, if you have been taking this medicine regularly for some time, do not suddenly stop taking it. You must gradually reduce the dose or you may get severe side effects. Ask your doctor or health care professional for advice before increasing or decreasing the dose. Even after you stop taking this medicine it can still affect your body for several days. You may get drowsy or dizzy. Do not drive, use machinery, or do anything that needs mental alertness until you know how this medicine affects you. To reduce the risk of dizzy and fainting spells, do not stand or sit up quickly, especially if you are an older patient. Alcohol may increase dizziness and drowsiness. Avoid alcoholic drinks. Do not treat yourself for coughs, colds or allergies without asking your doctor or health care professional for advice. Some ingredients can increase possible side effects. What side effects may I notice from receiving this medicine? Side effects that you should report to your doctor or health care professional as soon as possible: -changes in vision -confusion -depression -mood changes, excitability or aggressive behavior -movement difficulty, staggering or jerky movements -muscle cramps -restlessness -weakness or tiredness Side effects that usually do not require medical attention (report to your doctor or health care professional if they continue or are bothersome): -constipation or diarrhea -difficulty sleeping, nightmares -dizziness, drowsiness -headache -nausea, vomiting This list may not describe all possible side effects. Call your doctor for medical advice about side effects. You may report side effects to FDA at 1-800-FDA-1088. Where should I keep my medicine? Keep out of the reach of children. This medicine can be abused. Keep your medicine in a safe place to protect it from theft. Do not  share this medicine with anyone. Selling or giving away this medicine is dangerous and against the law. This medicine may cause accidental overdose and death if taken by other adults, children, or pets. Mix any unused medicine with a substance like cat litter or coffee grounds. Then throw the medicine away in a sealed container like a  sealed bag or a coffee can with a lid. Do not use the medicine after the expiration date. Store at room temperature between 20 and 25 degrees C (68 and 77 degrees F). Protect from light. Keep container tightly closed. NOTE: This sheet is a summary. It may not cover all possible information. If you have questions about this medicine, talk to your doctor, pharmacist, or health care provider.    2016, [employer] Standard. (2013-11-14 15:24:21)

## 2015-02-01 NOTE — ED Notes (Signed)
Patient verbalized understanding of discharge instructions and denies any further needs nor questions at this time. VS stable. Patient ambulatory with steady gait. 

## 2015-02-01 NOTE — ED Provider Notes (Signed)
CSN: [id number]     Arrival date & time 02/01/15  0413 History   First MD Initiated Contact with Patient 02/01/15 0454     Chief Complaint  Patient presents with  . Rash     (Consider location/radiation/quality/duration/timing/severity/associated sxs/prior Treatment) Patient is a 54 y.o. male presenting with rash. The history is provided by the patient.  Rash He states that he broke out in a rash over his face and chest 5 days ago after using [employer] to color his.. He had a similar reaction when he used the same products several years ago, that thought that he wouldn't have any problem because so much time had elapsed. He also relates that he had gone fishing and thinks he may have been exposed to poison ivy. Is complaining of intense itching which is only partly relieved with diphenhydramine. He denies any difficulty breathing or swallowing. Also, he complains of a trigger finger on the left thumb which is been present for some time. He states that sometimes the thumb gets stuck and he has to actually pull it straight.  Past Medical History  Diagnosis Date  . Hypertension    Past Surgical History  Procedure Laterality Date  . Eye surgery  to straighten it out    Right   No family history on file. Social History  Substance Use Topics  . Smoking status: Current Every Day Smoker -- 0.50 packs/day    Types: Cigarettes  . Smokeless tobacco: None  . Alcohol Use: No    Review of Systems  Skin: Positive for rash.  All other systems reviewed and are negative.     Allergies  Review of patient's allergies indicates no known allergies.  Home Medications   Prior to Admission medications   Medication Sig Start Date End Date Taking? Authorizing Provider  aspirin 325 MG tablet Take 325 mg by mouth daily as needed for headache. Patient took 3 tabs today 02/26/13    Historical Provider, MD  diphenhydrAMINE (BENADRYL) 25 MG tablet Take 1 tablet (25 mg total) by mouth every 6 (six)  hours as needed for itching (Rash). 02/09/13   Hannah Muthersbaugh, PA-C  famotidine (PEPCID) 20 MG tablet Take 1 tablet (20 mg total) by mouth 2 (two) times daily. 02/09/13   Hannah Muthersbaugh, PA-C  hydrocortisone 2.5 % lotion Apply topically 2 (two) times daily. 02/09/13   Hannah Muthersbaugh, PA-C  methocarbamol (ROBAXIN) 500 MG tablet Take 1 tablet (500 mg total) by mouth 2 (two) times daily. 02/26/13   Santiago GladHeather Laisure, PA-C  Multiple Vitamin (MULTIVITAMIN WITH MINERALS) TABS tablet Take 1 tablet by mouth daily.    Historical Provider, MD  naproxen (NAPROSYN) 500 MG tablet Take 1 tablet (500 mg total) by mouth 2 (two) times daily. 02/26/13   Heather Laisure, PA-C  permethrin (ELIMITE) 5 % cream Apply to affected area once, leave 8 hours then shower Repeat treatment in one week. 12/13/13   Mathis FareJennifer Lee H Presson, PA  predniSONE (DELTASONE) 20 MG tablet Take 2 tablets (40 mg total) by mouth daily. 02/09/13   Hannah Muthersbaugh, PA-C  traMADol (ULTRAM) 50 MG tablet Take 1 tablet (50 mg total) by mouth every 6 (six) hours as needed. 02/26/13   Heather Laisure, PA-C   BP 143/91 mmHg  Pulse 91  Temp(Src) 98.9 F (37.2 C) (Oral)  Resp 16  Ht 6' (1.829 m)  Wt 250 lb (113.399 kg)  BMI 33.90 kg/m2  SpO2 94% Physical Exam  Nursing note and vitals reviewed.  54  year old male, resting comfortably and in no acute distress. Vital signs are significant for hypertension. Oxygen saturation is 94%, which is normal. Head is normocephalic and atraumatic. PERRLA, EOMI. Oropharynx is clear. Neck is nontender and supple without adenopathy or JVD. Back is nontender and there is no CVA tenderness. Lungs are clear without rales, wheezes, or rhonchi. Chest is nontender. Heart has regular rate and rhythm without murmur. Abdomen is soft, flat, nontender without masses or hepatosplenomegaly and peristalsis is normoactive. Extremities have no cyanosis or edema, full range of motion is present. Full range of motion  is present of the left thumb but there is a click that is noted with flexion-extension at the IPJ. There is no joint instability. Skin: Erythematous rash is present over the face and chest and back of the neck. Rash consists of plaques and some areas of papules. Overall picture is consistent with contact dermatitis.. Neurologic: Mental status is normal, cranial nerves are intact, there are no motor or sensory deficits.  ED Course  Procedures (including critical care time)  MDM   Final diagnoses:  Contact dermatitis  Trigger thumb of left hand    Contact dermatitis. This is most likely from the hair coloring agent that he used but could conceivably be from poison ivy contact. Trigger thumb. He is referred to hand surgery for follow-up of his chair to thumb. He is discharged with prescriptions for prednisone and loratadine. Also given a prescription for small number of lorazepam to use at night to help control itching so he can sleep. Because of possibility of bile poison ivy being the etiology, steroid course will last for 2 weeks. He is advised never to use this coloring agent again. Also advised that if he chooses to use an alternative coloring agent to make sure that it does not share any gradients with the product that he just used today. Old records were reviewed confirming prior ED visit 2 years ago for the same problem with the same product.    Dione Booze, MD 02/01/15 [phone]

## 2015-02-01 NOTE — ED Notes (Signed)
Pt reports that reports that he is having a skin reaction to "just for men" which he put on his beard last Monday.  He states that his skin is itchy and the reaction is spreading despite trying benadryl, and several other medications.

## 2015-02-16 ENCOUNTER — Emergency Department (HOSPITAL_COMMUNITY)
Admission: EM | Admit: 2015-02-16 | Discharge: 2015-02-16 | Disposition: A | Discharge status: Home / Self Care | Payer: Medicaid Other | Attending: Emergency Medicine | Admitting: Emergency Medicine

## 2015-02-16 ENCOUNTER — Encounter (HOSPITAL_COMMUNITY): Payer: Self-pay | Admitting: Emergency Medicine

## 2015-02-16 DIAGNOSIS — R21 Rash and other nonspecific skin eruption: Secondary | ICD-10-CM | POA: Insufficient documentation

## 2015-02-16 DIAGNOSIS — Z79899 Other long term (current) drug therapy: Secondary | ICD-10-CM | POA: Diagnosis not present

## 2015-02-16 DIAGNOSIS — F17210 Nicotine dependence, cigarettes, uncomplicated: Secondary | ICD-10-CM | POA: Insufficient documentation

## 2015-02-16 DIAGNOSIS — Z7982 Long term (current) use of aspirin: Secondary | ICD-10-CM | POA: Diagnosis not present

## 2015-02-16 DIAGNOSIS — I10 Essential (primary) hypertension: Secondary | ICD-10-CM | POA: Diagnosis not present

## 2015-02-16 MED ORDER — FAMOTIDINE 20 MG PO TABS
20.0000 mg | ORAL_TABLET | Freq: Once | ORAL | Status: AC
  Administered 2015-02-16: 20 mg via ORAL
  Filled 2015-02-16: qty 1

## 2015-02-16 MED ORDER — HYDROXYZINE HCL 25 MG PO TABS: 25.0000 mg | ORAL_TABLET | ORAL | Status: DC | PRN

## 2015-02-16 MED ORDER — FAMOTIDINE 20 MG PO TABS: 20.0000 mg | ORAL_TABLET | Freq: Two times a day (BID) | ORAL | Status: DC

## 2015-02-16 MED ORDER — HYDROXYZINE HCL 25 MG PO TABS
50.0000 mg | ORAL_TABLET | Freq: Once | ORAL | Status: AC
  Administered 2015-02-16: 50 mg via ORAL
  Filled 2015-02-16: qty 2

## 2015-02-16 NOTE — ED Provider Notes (Signed)
CSN: [id number]     Arrival date & time 02/16/15  1410 History  By signing my name below, I, Ronney Lion, attest that this documentation has been prepared under the direction and in the presence of [employer], PA-C. Electronically Signed: Ronney Lion, ED Scribe. 02/16/2015. 2:47 PM.    Chief Complaint  Patient presents with  . Rash   The history is provided by the patient. No language interpreter was used.    HPI Comments: Jonathan Carney is a 54 y.o. male with a history of HTN, who presents to the Emergency Department complaining of a constant, pruritic, generalized rash around his beard and chest that began 2 weeks ago after using a hair coloring agent - "Just For [employer]" dye - on his beard. He was seen in the ED shortly after the rash onset and prescribed a 7-day course of prednisone, which improved his rash, but he still complains of continued itching. Patient has been applying alcohol and peroxide with no relief. He has also been applying Benadryl cream. He denies a history of DM.   Past Medical History  Diagnosis Date  . Hypertension    Past Surgical History  Procedure Laterality Date  . Eye surgery  to straighten it out    Right   No family history on file. Social History  Substance Use Topics  . Smoking status: Current Every Day Smoker -- 0.50 packs/day    Types: Cigarettes  . Smokeless tobacco: None  . Alcohol Use: No    Review of Systems  A complete 10 system review of systems was obtained and all systems are negative except as noted in the HPI and PMH.    Allergies  Review of patient's allergies indicates no known allergies.  Home Medications   Prior to Admission medications   Medication Sig Start Date End Date Taking? Authorizing Provider  aspirin 325 MG tablet Take 325 mg by mouth daily as needed for headache. Patient took 3 tabs today 02/26/13    Historical Provider, MD  diphenhydrAMINE (BENADRYL) 25 MG tablet Take 1 tablet (25 mg total) by mouth every 6 (six)  hours as needed for itching (Rash). 02/09/13   Hannah Muthersbaugh, PA-C  loratadine (CLARITIN) 10 MG tablet Take 1 tablet (10 mg total) by mouth daily. 02/01/15   Dione Booze, MD  LORazepam (ATIVAN) 1 MG tablet Take 1 tablet (1 mg total) by mouth at bedtime as needed for sleep (and itching). 02/01/15   Dione Booze, MD  Multiple Vitamin (MULTIVITAMIN WITH MINERALS) TABS tablet Take 1 tablet by mouth daily.    Historical Provider, MD  predniSONE (DELTASONE) 50 MG tablet Take 1 tablet (50 mg total) by mouth daily. 02/01/15   Dione Booze, MD   BP 133/98 mmHg  Pulse 109  Temp(Src) 98.6 F (37 C) (Oral)  Resp 16  Ht 6' (1.829 m)  Wt 250 lb (113.399 kg)  BMI 33.90 kg/m2  SpO2 96% Physical Exam  Constitutional: He is oriented to person, place, and time. He appears well-developed and well-nourished. No distress.  HENT:  Head: Normocephalic and atraumatic.  Mouth/Throat: Oropharynx is clear and moist.  Eyes: Conjunctivae and EOM are normal. Pupils are equal, round, and reactive to light. No scleral icterus.  Neck: Neck supple. No tracheal deviation present.  Cardiovascular: Normal rate and regular rhythm.   Pulmonary/Chest: Effort normal and breath sounds normal. No stridor. No respiratory distress.  Abdominal: Soft.  Musculoskeletal: Normal range of motion.  Neurological: He is alert and oriented to person, place,  and time.  Skin: Skin is warm and dry. Rash noted.  Papular lesions to chest, upper back: slightly excoriated. Lesions spare the palms soles and mucous membranes. There is no warmth, tenderness palpation or discharge.  Psychiatric: He has a normal mood and affect. His behavior is normal.  Nursing note and vitals reviewed.   ED Course  Procedures (including critical care time)  DIAGNOSTIC STUDIES: Oxygen Saturation is 96% on RA, normal by my interpretation.    COORDINATION OF CARE: 2:42 PM - Discussed treatment plan with pt at bedside which includes continuing Benadryl cream.  Will also Rx Pepcid and Atarax. Will give one dosage of medication here today. Advised pt to refrain from excessive scratching, as well as to stop applying peroxide and alcohol to his skin, which is likely causing further irritation. Pt verbalized understanding and agreed to plan.   MDM   Final diagnoses:  Rash and nonspecific skin eruption    Filed Vitals:   02/16/15 1418  BP: 133/98  Pulse: 109  Temp: 98.6 F (37 C)  TempSrc: Oral  Resp: 16  Height: 6' (1.829 m)  Weight: 113.399 kg  SpO2: 96%    Medications  hydrOXYzine (ATARAX/VISTARIL) tablet 50 mg (not administered)  famotidine (PEPCID) tablet 20 mg (not administered)    Jonathan Carney is 54 y.o. male presenting with persistent pruritic rash. Patient was seen and started on a prednisone burst for presumed allergic reaction to just for men Beard hair dye. States that the rash initially improved but after the steroids ran out it recurred. He's been applying rubbing alcohol and hydrogen peroxide which have advised him to DC immediately. I don't think he needs another course of steroids. I've encouraged him to continue with topical Benadryl, hydrocortisone and will start him on Atarax and Pepcid. Encourage close follow-up with primary care physician. No signs of superimposed infection.  Evaluation does not show pathology that would require ongoing emergent intervention or inpatient treatment. Pt is hemodynamically stable and mentating appropriately. Discussed findings and plan with patient/guardian, who agrees with care plan. All questions answered. Return precautions discussed and outpatient follow up given.   New Prescriptions   FAMOTIDINE (PEPCID) 20 MG TABLET    Take 1 tablet (20 mg total) by mouth 2 (two) times daily.   HYDROXYZINE (ATARAX/VISTARIL) 25 MG TABLET    Take 1 tablet (25 mg total) by mouth every 4 (four) hours as needed for itching.     I personally performed the services described in this documentation, which was  scribed in my presence. The recorded information has been reviewed and is accurate.     Wynetta Emeryicole Hanaan Gancarz, PA-C 02/16/15 1451  Margarita Grizzleanielle Ray, MD 02/17/15 [phone]

## 2015-02-16 NOTE — ED Notes (Signed)
Pt arrives from home with c/o rash continuing from last visit, states two weeks ago he put some cream on his beard and neck . Rash visible to chest. Itching

## 2015-02-16 NOTE — Discharge Instructions (Signed)
You may apply a topical hydrocortisone ointment to all affected areas except for the face.   Do not hesitate to call [phone] or return to the emergency room if you develop any shortness of breath, wheezing, tongue or lip swelling.  Please follow with your primary care doctor in the next 2 days for a check-up. They must obtain records for further management.   Do not hesitate to return to the Emergency Department for any new, worsening or concerning symptoms.

## 2015-02-16 NOTE — ED Notes (Signed)
Pt able to ambulate independently 

## 2015-07-08 ENCOUNTER — Encounter (HOSPITAL_COMMUNITY): Payer: Self-pay | Admitting: *Deleted

## 2015-07-08 ENCOUNTER — Emergency Department (HOSPITAL_COMMUNITY)
Admission: EM | Admit: 2015-07-08 | Discharge: 2015-07-08 | Disposition: A | Discharge status: Home / Self Care | Payer: Medicaid Other | Attending: Emergency Medicine | Admitting: Emergency Medicine

## 2015-07-08 ENCOUNTER — Emergency Department (HOSPITAL_COMMUNITY): Payer: Medicaid Other

## 2015-07-08 DIAGNOSIS — F17210 Nicotine dependence, cigarettes, uncomplicated: Secondary | ICD-10-CM | POA: Insufficient documentation

## 2015-07-08 DIAGNOSIS — I10 Essential (primary) hypertension: Secondary | ICD-10-CM | POA: Diagnosis not present

## 2015-07-08 DIAGNOSIS — J069 Acute upper respiratory infection, unspecified: Secondary | ICD-10-CM | POA: Diagnosis not present

## 2015-07-08 DIAGNOSIS — J029 Acute pharyngitis, unspecified: Secondary | ICD-10-CM | POA: Diagnosis present

## 2015-07-08 DIAGNOSIS — Z79899 Other long term (current) drug therapy: Secondary | ICD-10-CM | POA: Diagnosis not present

## 2015-07-08 DIAGNOSIS — Z7952 Long term (current) use of systemic steroids: Secondary | ICD-10-CM | POA: Insufficient documentation

## 2015-07-08 DIAGNOSIS — Z7982 Long term (current) use of aspirin: Secondary | ICD-10-CM | POA: Diagnosis not present

## 2015-07-08 MED ORDER — PREDNISONE 20 MG PO TABS: 60.0000 mg | ORAL_TABLET | Freq: Every day | ORAL | Status: DC

## 2015-07-08 MED ORDER — AZITHROMYCIN 250 MG PO TABS: 250.0000 mg | ORAL_TABLET | Freq: Every day | ORAL | Status: DC

## 2015-07-08 NOTE — Discharge Instructions (Signed)
[name],  Nice meeting you! Please follow-up with your primary care provider. Return to the emergency department if you develop chest pain, shortness of breath, nausea/vomiting, new/worsening symptoms. Feel better soon!  S. Lane Hacker, PA-C Cough, Adult A cough helps to clear your throat and lungs. A cough may last only 2-3 weeks (acute), or it may last longer than 8 weeks (chronic). Many different things can cause a cough. A cough may be a sign of an illness or another medical condition. HOME CARE  Pay attention to any changes in your cough.  Take medicines only as told by your doctor.  If you were prescribed an antibiotic medicine, take it as told by your doctor. Do not stop taking it even if you start to feel better.  Talk with your doctor before you try using a cough medicine.  Drink enough fluid to keep your pee (urine) clear or pale yellow.  If the air is dry, use a cold steam vaporizer or humidifier in your home.  Stay away from things that make you cough at work or at home.  If your cough is worse at night, try using extra pillows to raise your head up higher while you sleep.  Do not smoke, and try not to be around smoke. If you need help quitting, ask your doctor.  Do not have caffeine.  Do not drink alcohol.  Rest as needed. GET HELP IF:  You have new problems (symptoms).  You cough up yellow fluid (pus).  Your cough does not get better after 2-3 weeks, or your cough gets worse.  Medicine does not help your cough and you are not sleeping well.  You have pain that gets worse or pain that is not helped with medicine.  You have a fever.  You are losing weight and you do not know why.  You have night sweats. GET HELP RIGHT AWAY IF:  You cough up blood.  You have trouble breathing.  Your heartbeat is very fast.   This information is not intended to replace advice given to you by your health care provider. Make sure you discuss any questions  you have with your health care provider.   Document Released: 11/06/2010 Document Revised: 11/14/2014 Document Reviewed: 05/02/2014 Elsevier Interactive Patient Education 2016 Else[employer] Inc[employer]   Upper Respiratory Infection, Adult Most upper respiratory infections (URIs) are a viral infection of the air passages leading to the lungs. A URI affects the nose, throat, and upper air passages. The most common type of URI is nasopharyngitis and is typically referred to as "the common cold." URIs run their course and usually go away on their own. Most of the time, a URI does not require medical attention, but sometimes a bacterial infection in the upper airways can follow a viral infection. This is called a secondary infection. Sinus and middle ear infections are common types of secondary upper respiratory infections. Bacterial pneumonia can also complicate a URI. A URI can worsen asthma and chronic obstructive pulmonary disease (COPD). Sometimes, these complications can require emergency medical care and may be life threatening.  CAUSES Almost all URIs are caused by viruses. A virus is a type of germ and can spread from one person to another.  RISKS FACTORS You may be at risk for a URI if:   You smoke.   You have chronic heart or lung disease.  You have a weakened defense (immune) system.   You are very young or very old.   You have nasal  allergies or asthma.  You work in crowded or poorly ventilated areas.  You work in health care facilities or schools. SIGNS AND SYMPTOMS  Symptoms typically develop 2-3 days after you come in contact with a cold virus. Most viral URIs last 7-10 days. However, viral URIs from the influenza virus (flu virus) can last 14-18 days and are typically more severe. Symptoms may include:   Runny or stuffy (congested) nose.   Sneezing.   Cough.   Sore throat.   Headache.   Fatigue.   Fever.   Loss of appetite.   Pain in your forehead, behind  your eyes, and over your cheekbones (sinus pain).  Muscle aches.  DIAGNOSIS  Your health care provider may diagnose a URI by:  Physical exam.  Tests to check that your symptoms are not due to another condition such as:  Strep throat.  Sinusitis.  Pneumonia.  Asthma. TREATMENT  A URI goes away on its own with time. It cannot be cured with medicines, but medicines may be prescribed or recommended to relieve symptoms. Medicines may help:  Reduce your fever.  Reduce your cough.  Relieve nasal congestion. HOME CARE INSTRUCTIONS   Take medicines only as directed by your health care provider.   Gargle warm saltwater or take cough drops to comfort your throat as directed by your health care provider.  Use a warm mist humidifier or inhale steam from a shower to increase air moisture. This may make it easier to breathe.  Drink enough fluid to keep your urine clear or pale yellow.   Eat soups and other clear broths and maintain good nutrition.   Rest as needed.   Return to work when your temperature has returned to normal or as your health care provider advises. You may need to stay home longer to avoid infecting others. You can also use a face mask and careful hand washing to prevent spread of the virus.  Increase the usage of your inhaler if you have asthma.   Do not use any tobacco products, including cigarettes, chewing tobacco, or electronic cigarettes. If you need help quitting, ask your health care provider. PREVENTION  The best way to protect yourself from getting a cold is to practice good hygiene.   Avoid oral or hand contact with people with cold symptoms.   Wash your hands often if contact occurs.  There is no clear evidence that vitamin C, vitamin E, echinacea, or exercise reduces the chance of developing a cold. However, it is always recommended to get plenty of rest, exercise, and practice good nutrition.  SEEK MEDICAL CARE IF:   You are getting worse  rather than better.   Your symptoms are not controlled by medicine.   You have chills.  You have worsening shortness of breath.  You have brown or red mucus.  You have yellow or brown nasal discharge.  You have pain in your face, especially when you bend forward.  You have a fever.  You have swollen neck glands.  You have pain while swallowing.  You have white areas in the back of your throat. SEEK IMMEDIATE MEDICAL CARE IF:   You have severe or persistent:  Headache.  Ear pain.  Sinus pain.  Chest pain.  You have chronic lung disease and any of the following:  Wheezing.  Prolonged cough.  Coughing up blood.  A change in your usual mucus.  You have a stiff neck.  You have changes in your:  Vision.  Hearing.  Thinking.  Mood. MAKE SURE YOU:   Understand these instructions.  Will watch your condition.  Will get help right away if you are not doing well or get worse.   This information is not intended to replace advice given to you by your health care provider. Make sure you discuss any questions you have with your health care provider.   Document Released: 08/19/2000 Document Revised: 07/10/2014 Document Reviewed: 05/31/2013 Elsevier Interactive Patient Education [employer]2016 Elsevier Inc.

## 2015-07-08 NOTE — ED Notes (Signed)
Patient presents with c/o chest congestion and sore throat

## 2015-07-08 NOTE — ED Provider Notes (Signed)
CSN: [id number]     Arrival date & time 07/08/15  0544 History   First MD Initiated Contact with Patient 07/08/15 [phone]     Chief Complaint  Patient presents with  . Nasal Congestion  . Sore Throat   HPI  Jonathan Carney is a 55 y.o. male PMH significant for HTN presenting with a 2 week history of chest congestion, cough and a 4 day history of sore throat. He states he was seen by his primary care provider recently who prescribed him steroids which has provided minimal alleviation for his cough. He also endorses using Mucinex which has not alleviated his symptoms. He denies fevers, chills, chest pain, shortness of breath, abdominal pain, nausea, vomiting, change in bowel or bladder habits, drooling, voice changes.  Past Medical History  Diagnosis Date  . Hypertension    Past Surgical History  Procedure Laterality Date  . Eye surgery  to straighten it out    Right   No family history on file. Social History  Substance Use Topics  . Smoking status: Current Every Day Smoker -- 0.50 packs/day    Types: Cigarettes  . Smokeless tobacco: Never Used  . Alcohol Use: No    Review of Systems  Ten systems are reviewed and are negative for acute change except as noted in the HPI  Allergies  Review of patient's allergies indicates no known allergies.  Home Medications   Prior to Admission medications   Medication Sig Start Date End Date Taking? Authorizing Provider  aspirin 325 MG tablet Take 325 mg by mouth daily as needed for headache. Patient took 3 tabs today 02/26/13    Historical Provider, MD  diphenhydrAMINE (BENADRYL) 25 MG tablet Take 1 tablet (25 mg total) by mouth every 6 (six) hours as needed for itching (Rash). 02/09/13   Hannah Muthersbaugh, PA-C  famotidine (PEPCID) 20 MG tablet Take 1 tablet (20 mg total) by mouth 2 (two) times daily. 02/16/15   Nicole Pisciotta, PA-C  hydrOXYzine (ATARAX/VISTARIL) 25 MG tablet Take 1 tablet (25 mg total) by mouth every 4 (four) hours as  needed for itching. 02/16/15   Nicole Pisciotta, PA-C  loratadine (CLARITIN) 10 MG tablet Take 1 tablet (10 mg total) by mouth daily. 02/01/15   Dione Booze, MD  LORazepam (ATIVAN) 1 MG tablet Take 1 tablet (1 mg total) by mouth at bedtime as needed for sleep (and itching). 02/01/15   Dione Booze, MD  Multiple Vitamin (MULTIVITAMIN WITH MINERALS) TABS tablet Take 1 tablet by mouth daily.    Historical Provider, MD  predniSONE (DELTASONE) 50 MG tablet Take 1 tablet (50 mg total) by mouth daily. 02/01/15   Dione Booze, MD   BP 144/86 mmHg  Pulse 86  Temp(Src) 98.2 F (36.8 C) (Oral)  Ht 6' (1.829 m)  Wt 111.585 kg  BMI 33.36 kg/m2  SpO2 94% Physical Exam  Constitutional: He appears well-developed and well-nourished. No distress.  HENT:  Head: Normocephalic and atraumatic.  Mouth/Throat: No oropharyngeal exudate.  Tonsils 2+ bilaterally with minimal erythema. No uvular deviation.  Eyes: Conjunctivae are normal. Pupils are equal, round, and reactive to light. Right eye exhibits no discharge. Left eye exhibits no discharge. No scleral icterus.  Neck: No tracheal deviation present.  Cardiovascular: Normal rate, regular rhythm, normal heart sounds and intact distal pulses.  Exam reveals no gallop and no friction rub.   No murmur heard. Pulmonary/Chest: Effort normal and breath sounds normal. No respiratory distress. He has no wheezes. He has no rales. He exhibits  no tenderness.  Abdominal: Soft. Bowel sounds are normal. He exhibits no distension and no mass. There is no tenderness. There is no rebound and no guarding.  Musculoskeletal: He exhibits no edema.  Lymphadenopathy:    He has no cervical adenopathy.  Neurological: He is alert. Coordination normal.  Skin: Skin is warm and dry. No rash noted. He is not diaphoretic. No erythema.  Psychiatric: He has a normal mood and affect. His behavior is normal.  Nursing note and vitals reviewed.   ED Course  Procedures  Imaging Review Dg  Chest 2 View  07/08/2015  CLINICAL DATA:  Productive cough for 1 month. Chills. Smoker. Initial encounter. EXAM: CHEST  2 VIEW COMPARISON:  None. FINDINGS: The lungs are clear. Heart size is normal. There is no pneumothorax or pleural effusion. Thoracic spondylosis is noted. IMPRESSION: No acute disease. Electronically Signed   By: Drusilla Kannerhomas  Dalessio M.D.   On: 07/08/2015 07:13   I have personally reviewed and evaluated these images and lab results as part of my medical decision-making.  MDM   Final diagnoses:  Upper respiratory infection   Patient nontoxic-appearing, vital signs stable. No wheezing on examination. No concern for Ludwig's, PTA. Chest x-ray unremarkable. Given that patient is a smoker and has had symptoms for 2 weeks, will treat today with azithromycin and steroids. Patient may be safely discharged home. Discussed reasons for return. Patient to follow-up with primary care provider within one week. Patient in understanding and agreement with the plan.   Melton KrebsSamantha Nicole Tiyana Galla, PA-C 07/08/15 [phone]  Benjiman CoreNathan Pickering, MD 07/08/15 [phone]

## 2015-08-24 ENCOUNTER — Emergency Department (HOSPITAL_COMMUNITY)
Admission: EM | Admit: 2015-08-24 | Discharge: 2015-08-24 | Disposition: A | Discharge status: Home / Self Care | Payer: Medicaid Other | Attending: Emergency Medicine | Admitting: Emergency Medicine

## 2015-08-24 ENCOUNTER — Encounter (HOSPITAL_COMMUNITY): Payer: Self-pay | Admitting: Emergency Medicine

## 2015-08-24 ENCOUNTER — Emergency Department (HOSPITAL_COMMUNITY): Payer: Medicaid Other

## 2015-08-24 DIAGNOSIS — M25512 Pain in left shoulder: Secondary | ICD-10-CM | POA: Insufficient documentation

## 2015-08-24 DIAGNOSIS — I10 Essential (primary) hypertension: Secondary | ICD-10-CM | POA: Insufficient documentation

## 2015-08-24 DIAGNOSIS — M5410 Radiculopathy, site unspecified: Secondary | ICD-10-CM | POA: Insufficient documentation

## 2015-08-24 DIAGNOSIS — R079 Chest pain, unspecified: Secondary | ICD-10-CM

## 2015-08-24 DIAGNOSIS — F17210 Nicotine dependence, cigarettes, uncomplicated: Secondary | ICD-10-CM | POA: Diagnosis not present

## 2015-08-24 DIAGNOSIS — M79602 Pain in left arm: Secondary | ICD-10-CM | POA: Diagnosis present

## 2015-08-24 LAB — CBC
HEMATOCRIT: 41.6 % (ref 39.0–52.0)
HEMOGLOBIN: 13.7 g/dL (ref 13.0–17.0)
MCH: 30.2 pg (ref 26.0–34.0)
MCHC: 32.9 g/dL (ref 30.0–36.0)
MCV: 91.8 fL (ref 78.0–100.0)
Platelets: 307 10*3/uL (ref 150–400)
RBC: 4.53 MIL/uL (ref 4.22–5.81)
RDW: 14.4 % (ref 11.5–15.5)
WBC: 5.3 10*3/uL (ref 4.0–10.5)

## 2015-08-24 LAB — BASIC METABOLIC PANEL
ANION GAP: 7 (ref 5–15)
BUN: 14 mg/dL (ref 6–20)
CO2: 25 mmol/L (ref 22–32)
Calcium: 9.7 mg/dL (ref 8.9–10.3)
Chloride: 104 mmol/L (ref 101–111)
Creatinine, Ser: 0.99 mg/dL (ref 0.61–1.24)
GFR calc Af Amer: >60 mL/min (ref >60)
GFR calc non Af Amer: >60 mL/min (ref >60)
GLUCOSE: 100 mg/dL — AB (ref 65–99)
POTASSIUM: 4.2 mmol/L (ref 3.5–5.1)
Sodium: 136 mmol/L (ref 135–145)

## 2015-08-24 LAB — I-STAT TROPONIN, ED
Troponin i, poc: 0.01 ng/mL (ref 0.00–0.08)
Troponin i, poc: 0.01 ng/mL (ref 0.00–0.08)

## 2015-08-24 MED ORDER — IBUPROFEN 200 MG PO TABS
600.0000 mg | ORAL_TABLET | Freq: Once | ORAL | Status: AC
  Administered 2015-08-24: 600 mg via ORAL
  Filled 2015-08-24: qty 3

## 2015-08-24 MED ORDER — MELOXICAM 15 MG PO TABS: 15.0000 mg | ORAL_TABLET | Freq: Every day | ORAL | Status: DC

## 2015-08-24 MED ORDER — CYCLOBENZAPRINE HCL 10 MG PO TABS: 10.0000 mg | ORAL_TABLET | Freq: Every day | ORAL | Status: DC

## 2015-08-24 NOTE — ED Provider Notes (Signed)
CSN: [id number]     Arrival date & time 08/24/15  0344 History   First MD Initiated Contact with Patient 08/24/15 [phone]     Chief Complaint  Patient presents with  . Chest Pain     (Consider location/radiation/quality/duration/timing/severity/associated sxs/prior Treatment) HPI   Patient is a 55 year old male with a history of hypertension who presents to the ED with left arm pain and finger numbness for 10 days. Patient states he was doing pushups 10 days ago and the next day he began to experience pain in his left shoulder. Pain radiates down his arm and into his left chest, is 10/10, nothing makes it better, raising his arm over his head makes it worse. Associated numbness and tingling in his 1-3 fingers of his left hand. Patient states he's had these symptoms in the past his PCP referred him to a spine specialist but he never followed up. Pt has a history of cocaine use but states he quit in 2012. Pt is a current smoker. He denies dizziness, syncope, shortness of breath, abdominal pain, nausea, vomiting. He  feels as though the pain in his left arm/chest is not related to his heart or lungs.            Past Medical History  Diagnosis Date  . Hypertension    Past Surgical History  Procedure Laterality Date  . Eye surgery  to straighten it out    Right   No family history on file. Social History  Substance Use Topics  . Smoking status: Current Every Day Smoker -- 0.50 packs/day    Types: Cigarettes  . Smokeless tobacco: Never Used  . Alcohol Use: No    Review of Systems  Constitutional: Negative for fever.  Eyes: Negative for visual disturbance.  Respiratory: Negative for cough, chest tightness and shortness of breath.   Cardiovascular: Positive for chest pain.  Gastrointestinal: Negative for nausea, vomiting and abdominal pain.  Musculoskeletal: Positive for arthralgias. Negative for joint swelling and neck stiffness.  Skin: Negative for rash.  Allergic/Immunologic: Negative  for immunocompromised state.  Neurological: Positive for numbness. Negative for dizziness, syncope, speech difficulty and weakness.  Psychiatric/Behavioral: Negative for confusion.      Allergies  Review of patient's allergies indicates no known allergies.  Home Medications   Prior to Admission medications   Medication Sig Start Date End Date Taking? Authorizing Provider  PRESCRIPTION MEDICATION Take 1 tablet by mouth daily. Blood pressure   Yes Historical Provider, MD  azithromycin (ZITHROMAX) 250 MG tablet Take 1 tablet (250 mg total) by mouth daily. Take first 2 tablets together, then 1 every day until finished. Patient not taking: Reported on 08/24/2015 07/08/15   Melton KrebsSamantha Nicole Riley, PA-C  cyclobenzaprine (FLEXERIL) 10 MG tablet Take 1 tablet (10 mg total) by mouth at bedtime. 08/24/15   Jerre SimonJessica L Lyly Canizales, PA  diphenhydrAMINE (BENADRYL) 25 MG tablet Take 1 tablet (25 mg total) by mouth every 6 (six) hours as needed for itching (Rash). Patient not taking: Reported on 08/24/2015 02/09/13   Dahlia ClientHannah Muthersbaugh, PA-C  famotidine (PEPCID) 20 MG tablet Take 1 tablet (20 mg total) by mouth 2 (two) times daily. Patient not taking: Reported on 08/24/2015 02/16/15   Joni ReiningNicole Pisciotta, PA-C  hydrOXYzine (ATARAX/VISTARIL) 25 MG tablet Take 1 tablet (25 mg total) by mouth every 4 (four) hours as needed for itching. Patient not taking: Reported on 08/24/2015 02/16/15   Joni ReiningNicole Pisciotta, PA-C  loratadine (CLARITIN) 10 MG tablet Take 1 tablet (10 mg total) by mouth daily.  Patient not taking: Reported on 08/24/2015 02/01/15   Dione Booze, MD  LORazepam (ATIVAN) 1 MG tablet Take 1 tablet (1 mg total) by mouth at bedtime as needed for sleep (and itching). Patient not taking: Reported on 08/24/2015 02/01/15   Dione Booze, MD  meloxicam (MOBIC) 15 MG tablet Take 1 tablet (15 mg total) by mouth daily. 08/24/15   Jerre Simon, PA  predniSONE (DELTASONE) 20 MG tablet Take 3 tablets (60 mg total) by mouth  daily. Patient not taking: Reported on 08/24/2015 07/08/15   Melton Krebs, PA-C   BP 141/88 mmHg  Pulse 62  Temp(Src) 97.3 F (36.3 C) (Oral)  Resp 11  SpO2 99% Physical Exam  Constitutional: He appears well-developed and well-nourished. No distress.  HENT:  Head: Normocephalic and atraumatic.  Eyes: Conjunctivae are normal.  Neck: Normal range of motion and full passive range of motion without pain. Neck supple. Muscular tenderness present. No spinous process tenderness present. No rigidity.  Cardiovascular: Normal rate, regular rhythm and normal heart sounds.  Exam reveals no gallop and no friction rub.   No murmur heard. Pulses:      Radial pulses are 2+ on the right side, and 2+ on the left side.  Pulmonary/Chest: Effort normal and breath sounds normal. No respiratory distress. He has no wheezes. He has no rales.  Abdominal: Soft. There is no tenderness.  Musculoskeletal:  Examination of bilateral upper extremities revealed no deformities, edema, ecchymosis. Full AROM. Pain with flexion of the left shoulder, pain with lift off test, TTP of the [facility] joint and posterior shoulder, strength 5/5 of BUE, 4/5 grip of left hand, 5/5 grip of right hand, sensation intact.   Neurological: He is alert. Coordination normal.  Skin: Skin is warm and dry.  Psychiatric: He has a normal mood and affect. His behavior is normal.  Nursing note and vitals reviewed.  Smoking cessation instruction/counseling given:  counseled patient on the dangers of tobacco use, advised patient to stop smoking, and reviewed strategies to maximize success   ED Course  Procedures (including critical care time) Labs Review Labs Reviewed  BASIC METABOLIC PANEL - Abnormal; Notable for the following:    Glucose, Bld 100 (*)    All other components within normal limits  CBC  I-STAT TROPOININ, ED  Rosezena Sensor, ED    Imaging Review Dg Chest 2 View  08/24/2015  CLINICAL DATA:  Chest pain, left arm pain, and  numbness for 2 weeks. Smoker. EXAM: CHEST  2 VIEW COMPARISON:  07/08/2015 FINDINGS: Normal heart size and pulmonary vascularity. No focal airspace disease or consolidation in the lungs. No blunting of costophrenic angles. No pneumothorax. Mediastinal contours appear intact. Degenerative changes in the spine. IMPRESSION: No active cardiopulmonary disease. Electronically Signed   By: Burman Nieves M.D.   On: 08/24/2015 04:47   I have personally reviewed and evaluated these images and lab results as part of my medical decision-making.   EKG Interpretation   Date/Time:  Saturday August 24 2015 03:52:16 EDT Ventricular Rate:  73 PR Interval:  160 QRS Duration: 88 QT Interval:  348 QTC Calculation: 383 R Axis:   -40 Text Interpretation:  Normal sinus rhythm with sinus arrhythmia Left axis  deviation Abnormal ECG [id number] Nonspecific ST and T wave abnormality No old  tracing to compare Confirmed by Rhunette Croft, MD, Janey Genta [phone]) on 08/24/2015  6:37:00 AM      MDM   Final diagnoses:  Chest pain, unspecified chest pain type  Left shoulder pain  Radiculopathy, unspecified spinal region    Patient is to be discharged with recommendation to follow up with PCP in regards to today's hospital visit. Chest pain is not likely of cardiac or pulmonary etiology d/t presentation, VSS, no tracheal deviation, no JVD or new murmur, RRR, breath sounds equal bilaterally, EKG without ST elevation, negative troponin, and negative CXR.  Pt without chest pain or SOB while in the ED. Less likely MI or PE. Pt's blood pressure fluctuated while in the ED. States his girlfriend's brother was shot and killed this morning.   Pt has been advised to return to the ED if CP becomes exertional, associated with diaphoresis or nausea, radiates to left jaw/arm, worsens or becomes concerning in any way.   Left shoulder pain and hand numbness appears to be chronic in nature as patient has had this issue in the past. He was supposed to  follow up with a spine surgeon but did not. Patient's shoulder pain reproducible exam and patient states pain radiates into his left chest.The pain occurred after doing pushups so likely muscular skeletal nature. Likely cervical radiculopathy given history and/or rotator cuff impingement with pain with lift off test.  Patient given Motrin while in the ED. Discharged with prescription for Mobic and Flexeril. Discharge patient to follow-up with his primary care provider Monday.  Pt appears reliable for follow up and is agreeable to discharge.  Case has been discussed with and seen by Dr. Rhunette Croft who agrees with the above plan to discharge.      Jerre Simon, PA 08/24/15 [id number]  Derwood Kaplan, MD 08/24/15 [phone]

## 2015-08-24 NOTE — Discharge Instructions (Signed)
Follow-up with your primary care provider on Monday to be seen regarding your shoulder pain. Take the Mobic and Flexeril as prescribed. Be sure to eat food with the Mobic. Flexeril can make you drowsy do not drive or operate heavy machinery while on this medication. It is best to take the Flexeril at night and do not drink alcohol with this medication.  Return to emergency department if you experience chest pain, shortness breath, dizziness, sweating, nausea, vomiting, or you pass out.  Shoulder Pain The shoulder is the joint that connects your arms to your body. The bones that form the shoulder joint include the upper arm bone (humerus), the shoulder blade (scapula), and the collarbone (clavicle). The top of the humerus is shaped like a ball and fits into a rather flat socket on the scapula (glenoid cavity). A combination of muscles and strong, fibrous tissues that connect muscles to bones (tendons) support your shoulder joint and hold the ball in the socket. Small, fluid-filled sacs (bursae) are located in different areas of the joint. They act as cushions between the bones and the overlying soft tissues and help reduce friction between the gliding tendons and the bone as you move your arm. Your shoulder joint allows a wide range of motion in your arm. This range of motion allows you to do things like scratch your back or throw a ball. However, this range of motion also makes your shoulder more prone to pain from overuse and injury. Causes of shoulder pain can originate from both injury and overuse and usually can be grouped in the following four categories:  Redness, swelling, and pain (inflammation) of the tendon (tendinitis) or the bursae (bursitis).  Instability, such as a dislocation of the joint.  Inflammation of the joint (arthritis).  Broken bone (fracture). HOME CARE INSTRUCTIONS   Apply ice to the sore area.  Put ice in a plastic bag.  Place a towel between your skin and the  bag.  Leave the ice on for 15-20 minutes, 3-4 times per day for the first 2 days, or as directed by your health care provider.  Stop using cold packs if they do not help with the pain.  If you have a shoulder sling or immobilizer, wear it as long as your caregiver instructs. Only remove it to shower or bathe. Move your arm as little as possible, but keep your hand moving to prevent swelling.  Squeeze a soft ball or foam pad as much as possible to help prevent swelling.  Only take over-the-counter or prescription medicines for pain, discomfort, or fever as directed by your caregiver. SEEK MEDICAL CARE IF:   Your shoulder pain increases, or new pain develops in your arm, hand, or fingers.  Your hand or fingers become cold and numb.  Your pain is not relieved with medicines. SEEK IMMEDIATE MEDICAL CARE IF:   Your arm, hand, or fingers are numb or tingling.  Your arm, hand, or fingers are significantly swollen or turn white or blue. MAKE SURE YOU:   Understand these instructions.  Will watch your condition.   Nonspecific Chest Pain  Chest pain can be caused by many different conditions. There is always a chance that your pain could be related to something serious, such as a heart attack or a blood clot in your lungs. Chest pain can also be caused by conditions that are not life-threatening. If you have chest pain, it is very important to follow up with your health care provider. CAUSES  Chest pain can  be caused by: Heartburn. Pneumonia or bronchitis. Anxiety or stress. Inflammation around your heart (pericarditis) or lung (pleuritis or pleurisy). A blood clot in your lung. A collapsed lung (pneumothorax). It can develop suddenly on its own (spontaneous pneumothorax) or from trauma to the chest. Shingles infection (varicella-zoster virus). Heart attack. Damage to the bones, muscles, and cartilage that make up your chest wall. This can include: Bruised bones due to  injury. Strained muscles or cartilage due to frequent or repeated coughing or overwork. Fracture to one or more ribs. Sore cartilage due to inflammation (costochondritis). RISK FACTORS  Risk factors for chest pain may include: Activities that increase your risk for trauma or injury to your chest. Respiratory infections or conditions that cause frequent coughing. Medical conditions or overeating that can cause heartburn. Heart disease or family history of heart disease. Conditions or health behaviors that increase your risk of developing a blood clot. Having had chicken pox (varicella zoster). SIGNS AND SYMPTOMS Chest pain can feel like: Burning or tingling on the surface of your chest or deep in your chest. Crushing, pressure, aching, or squeezing pain. Dull or sharp pain that is worse when you move, cough, or take a deep breath. Pain that is also felt in your back, neck, shoulder, or arm, or pain that spreads to any of these areas. Your chest pain may come and go, or it may stay constant. DIAGNOSIS Lab tests or other studies may be needed to find the cause of your pain. Your health care provider may have you take a test called an ambulatory ECG (electrocardiogram). An ECG records your heartbeat patterns at the time the test is performed. You may also have other tests, such as: Transthoracic echocardiogram (TTE). During echocardiography, sound waves are used to create a picture of all of the heart structures and to look at how blood flows through your heart. Transesophageal echocardiogram (TEE).This is a more advanced imaging test that obtains images from inside your body. It allows your health care provider to see your heart in finer detail. Cardiac monitoring. This allows your health care provider to monitor your heart rate and rhythm in real time. Holter monitor. This is a portable device that records your heartbeat and can help to diagnose abnormal heartbeats. It allows your health care  provider to track your heart activity for several days, if needed. Stress tests. These can be done through exercise or by taking medicine that makes your heart beat more quickly. Blood tests. Imaging tests. TREATMENT  Your treatment depends on what is causing your chest pain. Treatment may include: Medicines. These may include: Acid blockers for heartburn. Anti-inflammatory medicine. Pain medicine for inflammatory conditions. Antibiotic medicine, if an infection is present. Medicines to dissolve blood clots. Medicines to treat coronary artery disease. Supportive care for conditions that do not require medicines. This may include: Resting. Applying heat or cold packs to injured areas. Limiting activities until pain decreases. HOME CARE INSTRUCTIONS If you were prescribed an antibiotic medicine, finish it all even if you start to feel better. Avoid any activities that bring on chest pain. Do not use any tobacco products, including cigarettes, chewing tobacco, or electronic cigarettes. If you need help quitting, ask your health care provider. Do not drink alcohol. Take medicines only as directed by your health care provider. Keep all follow-up visits as directed by your health care provider. This is important. This includes any further testing if your chest pain does not go away. If heartburn is the cause for your chest pain,  you may be told to keep your head raised (elevated) while sleeping. This reduces the chance that acid will go from your stomach into your esophagus. Make lifestyle changes as directed by your health care provider. These may include: Getting regular exercise. Ask your health care provider to suggest some activities that are safe for you. Eating a heart-healthy diet. A registered dietitian can help you to learn healthy eating options. Maintaining a healthy weight. Managing diabetes, if necessary. Reducing stress. SEEK MEDICAL CARE IF: Your chest pain does not go away  after treatment. You have a rash with blisters on your chest. You have a fever. SEEK IMMEDIATE MEDICAL CARE IF:  Your chest pain is worse. You have an increasing cough, or you cough up blood. You have severe abdominal pain. You have severe weakness. You faint. You have chills. You have sudden, unexplained chest discomfort. You have sudden, unexplained discomfort in your arms, back, neck, or jaw. You have shortness of breath at any time. You suddenly start to sweat, or your skin gets clammy. You feel nauseous or you vomit. You suddenly feel light-headed or dizzy. Your heart begins to beat quickly, or it feels like it is skipping beats. These symptoms may represent a serious problem that is an emergency. Do not wait to see if the symptoms will go away. Get medical help right away. Call your local emergency services (911 in the [place] Do not drive yourself to the hospital.   This information is not intended to replace advice given to you by your health care provider. Make sure you discuss any questions you have with your health care provider.   Document Released: 12/03/2004 Document Revised: 03/16/2014 Document Reviewed: 09/29/2013 Elsevier Interactive Patient Education [employer]  Will get help right away if you are not doing well or get worse.   This information is not intended to replace advice given to you by your health care provider. Make sure you discuss any questions you have with your health care provider.   Document Released: 12/03/2004 Document Revised: 03/16/2014 Document Reviewed: 06/18/2014 Elsevier Interactive Patient Education [employer]

## 2015-08-24 NOTE — ED Notes (Addendum)
C/o L sided chest pain that radiates down L arm with sob x 2 weeks.  Reports nausea today.  Pt was recently notified of deceased family member from [facility].

## 2015-08-24 NOTE — ED Notes (Signed)
Patient transported to X-ray 

## 2015-09-12 ENCOUNTER — Encounter (HOSPITAL_COMMUNITY): Payer: Self-pay | Admitting: Emergency Medicine

## 2015-09-12 ENCOUNTER — Ambulatory Visit (HOSPITAL_COMMUNITY)
Admission: EM | Admit: 2015-09-12 | Discharge: 2015-09-12 | Disposition: A | Discharge status: Home / Self Care | Payer: Medicaid Other | Attending: Emergency Medicine | Admitting: Emergency Medicine

## 2015-09-12 ENCOUNTER — Ambulatory Visit (INDEPENDENT_AMBULATORY_CARE_PROVIDER_SITE_OTHER): Payer: Medicaid Other

## 2015-09-12 DIAGNOSIS — M6520 Calcific tendinitis, unspecified site: Secondary | ICD-10-CM

## 2015-09-12 MED ORDER — BUPIVACAINE HCL (PF) 0.25 % IJ SOLN
5.0000 mL | Freq: Once | INTRAMUSCULAR | Status: AC
  Administered 2015-09-12: 5 mL

## 2015-09-12 MED ORDER — TRIAMCINOLONE ACETONIDE 40 MG/ML IJ SUSP
INTRAMUSCULAR | Status: AC
  Filled 2015-09-12: qty 1

## 2015-09-12 MED ORDER — BUPIVACAINE HCL (PF) 0.5 % IJ SOLN
INTRAMUSCULAR | Status: AC
  Filled 2015-09-12: qty 10

## 2015-09-12 MED ORDER — TRIAMCINOLONE ACETONIDE 40 MG/ML IJ SUSP
40.0000 mg | Freq: Once | INTRAMUSCULAR | Status: AC
  Administered 2015-09-12: 40 mg via INTRAMUSCULAR

## 2015-09-12 MED ORDER — HYDROCODONE-ACETAMINOPHEN 7.5-325 MG PO TABS: 1.0000 | ORAL_TABLET | Freq: Four times a day (QID) | ORAL | Status: DC | PRN

## 2015-09-12 NOTE — ED Notes (Signed)
Here for left hand pain onset 1-2 months... Reports it might be due to doing push ups Pain also radiates to left side of chest.  Denies inj/trauma [facility] for similar sx on 6/17 A&O x4... NAD

## 2015-09-12 NOTE — ED Provider Notes (Signed)
CSN: [id number]     Arrival date & time 09/12/15  1004 History   First MD Initiated Contact with Patient 09/12/15 1043     Chief Complaint  Patient presents with  . Hand Pain   (Consider location/radiation/quality/duration/timing/severity/associated sxs/prior Treatment) HPI History obtained from patient: Location: Left shoulder  Context/Duration: No known injury pain and left shoulder for about a month now steadily getting worse.  Severity: 4 or 5  Quality: Aching Timing:        Constant    Home Treatment: Hot showers Associated symptoms:  Difficulty left and objects Family History: Hypertension    Past Medical History  Diagnosis Date  . Hypertension    Past Surgical History  Procedure Laterality Date  . Eye surgery  to straighten it out    Right   History reviewed. No pertinent family history. Social History  Substance Use Topics  . Smoking status: Current Every Day Smoker -- 0.50 packs/day    Types: Cigarettes  . Smokeless tobacco: Never Used  . Alcohol Use: No    Review of Systems  Denies: HEADACHE, NAUSEA, ABDOMINAL PAIN, CHEST PAIN, CONGESTION, DYSURIA, SHORTNESS OF BREATH  Allergies  Review of patient's allergies indicates no known allergies.  Home Medications   Prior to Admission medications   Medication Sig Start Date End Date Taking? Authorizing Provider  meloxicam (MOBIC) 15 MG tablet Take 1 tablet (15 mg total) by mouth daily. 08/24/15  Yes Jessica L Focht, PA  predniSONE (DELTASONE) 20 MG tablet Take 3 tablets (60 mg total) by mouth daily. 07/08/15  Yes Melton KrebsSamantha Nicole Riley, PA-C  azithromycin (ZITHROMAX) 250 MG tablet Take 1 tablet (250 mg total) by mouth daily. Take first 2 tablets together, then 1 every day until finished. Patient not taking: Reported on 08/24/2015 07/08/15   Melton KrebsSamantha Nicole Riley, PA-C  cyclobenzaprine (FLEXERIL) 10 MG tablet Take 1 tablet (10 mg total) by mouth at bedtime. 08/24/15   Jerre SimonJessica L Focht, PA  diphenhydrAMINE (BENADRYL) 25 MG  tablet Take 1 tablet (25 mg total) by mouth every 6 (six) hours as needed for itching (Rash). Patient not taking: Reported on 08/24/2015 02/09/13   Dahlia ClientHannah Muthersbaugh, PA-C  famotidine (PEPCID) 20 MG tablet Take 1 tablet (20 mg total) by mouth 2 (two) times daily. Patient not taking: Reported on 08/24/2015 02/16/15   Joni ReiningNicole Pisciotta, PA-C  HYDROcodone-acetaminophen (NORCO) 7.5-325 MG tablet Take 1 tablet by mouth every 6 (six) hours as needed for moderate pain. 09/12/15   Tharon AquasFrank C Dmitry Macomber, PA  hydrOXYzine (ATARAX/VISTARIL) 25 MG tablet Take 1 tablet (25 mg total) by mouth every 4 (four) hours as needed for itching. Patient not taking: Reported on 08/24/2015 02/16/15   Joni ReiningNicole Pisciotta, PA-C  loratadine (CLARITIN) 10 MG tablet Take 1 tablet (10 mg total) by mouth daily. Patient not taking: Reported on 08/24/2015 02/01/15   Dione Boozeavid Glick, MD  LORazepam (ATIVAN) 1 MG tablet Take 1 tablet (1 mg total) by mouth at bedtime as needed for sleep (and itching). Patient not taking: Reported on 08/24/2015 02/01/15   Dione Boozeavid Glick, MD  PRESCRIPTION MEDICATION Take 1 tablet by mouth daily. Blood pressure    Historical Provider, MD   Meds Ordered and Administered this Visit   Medications  triamcinolone acetonide (KENALOG-40) injection 40 mg (40 mg Intramuscular Given 09/12/15 1139)  bupivacaine (PF) (MARCAINE) 0.25 % injection 5 mL (5 mLs Infiltration Given 09/12/15 1139)    BP 154/95 mmHg  Pulse 95  Temp(Src) 98.3 F (36.8 C) (Oral)  Resp 12  SpO2  99% No data found.   Physical Exam NURSES NOTES AND VITAL SIGNS REVIEWED. CONSTITUTIONAL: Well developed, well nourished, no acute distress HEENT: normocephalic, atraumatic EYES: Conjunctiva normal NECK:normal ROM, supple, no adenopathy PULMONARY:No respiratory distress, normal effort ABDOMINAL: Soft, ND, NT BS+, No CVAT MUSCULOSKELETAL: Normal ROM of all extremities, Shoulder is tender to palpation anteriorly and laterally. There is some crepitus noted. Decreased  in range of motion. SKIN: warm and dry without rash PSYCHIATRIC: Mood and affect, behavior are normal  ED Course  Injection of joint Date/Time: 09/12/2015 8:56 PM Performed by: Tharon AquasPATRICK, Sharad Vaneaton C Authorized by: Charm RingsHONIG, ERIN J Consent: Verbal consent obtained. Consent given by: patient Patient identity confirmed: verbally with patient Time out: Immediately prior to procedure a "time out" was called to verify the correct patient, procedure, equipment, support staff and site/side marked as required. Preparation: Patient was prepped and draped in the usual sterile fashion. Local anesthesia used: no Patient sedated: no Patient tolerance: Patient tolerated the procedure well with no immediate complications   (including critical care time)  Labs Review Labs Reviewed - No data to display  Imaging Review Dg Shoulder Left  09/12/2015  CLINICAL DATA:  Persistent pain for several weeks, beginning after heavy exercise EXAM: LEFT SHOULDER - 2+ VIEW COMPARISON:  None. FINDINGS: Frontal, oblique, Y scapular, and axillary images were obtained. There is no fracture or dislocation. The joint spaces appear normal. There is osteoarthritic change in the acromioclavicular joint with bony overgrowth along the inferior lateral clavicle. No erosive change or intra-articular calcification. Visualized left lung clear. IMPRESSION: Osteoarthritic change acromioclavicular joint with bony overgrowth along the inferior lateral left clavicle. No fracture or dislocation. No erosive change. Electronically Signed   By: Bretta BangWilliam  Woodruff III M.D.   On: 09/12/2015 11:07     Visual Acuity Review  Right Eye Distance:   Left Eye Distance:   Bilateral Distance:    Right Eye Near:   Left Eye Near:    Bilateral Near:         MDM   1. Calcific tendonitis     Patient is reassured that there are no issues that require transfer to higher level of care at this time or additional tests. Patient is advised to continue home  symptomatic treatment. Patient is advised that if there are new or worsening symptoms to attend the emergency department, contact primary care provider, or return to UC. Instructions of care provided discharged home in stable condition.    THIS NOTE WAS GENERATED USING A VOICE RECOGNITION SOFTWARE PROGRAM. ALL REASONABLE EFFORTS  WERE MADE TO PROOFREAD THIS DOCUMENT FOR ACCURACY.  I have verbally reviewed the discharge instructions with the patient. A printed AVS was given to the patient.  All questions were answered prior to discharge.      Tharon AquasFrank C Demetric Dunnaway, PA 09/12/15 2056

## 2015-09-12 NOTE — Discharge Instructions (Signed)
Calcific Tendinitis °Calcific tendinitis occurs when crystals of calcium are deposited in a tendon. Tendons are bands of strong, fibrous tissue that attach muscles to bones. Tendons are an important part of joints. They make the joint move and they absorb some of the stress that a joint receives during use. When calcium is deposited in the tendon, the tendon becomes stiff, painful, and it can become swollen. Calcific tendinitis occurs frequently in the shoulder joint, in a structure called the rotator cuff. °CAUSES  °The cause of calcific tendinitis is unclear. It may be associated with: °· Overuse of the tendon, such as from repetitive motion. °· Excess stress on the tendon. °· Aging. °· Repetitive, mild injuries. °SYMPTOMS  °· Pain may or may not be present. If it is present, it may occur when moving the joint. °· Tenderness when pressure is applied to the tendon. °· A snapping or popping sound when the joint moves. °· Decreased motion of the joint. °· Difficulty sleeping due to pain in the joint. °DIAGNOSIS  °Your health care provider will perform a physical exam. Imaging tests may also be used to make the diagnosis. These may include X-rays, an MRI, or a CT scan. °TREATMENT  °Generally, calcific tendinitis resolves on its own. Treatment for pain of calcific tendinitis may include: °· Taking over-the-counter medicines, such as anti-inflammatory drugs. °· Applying ice packs to the joint. °· Following a specific exercise program to keep the joint working properly. °· Attending physical therapy sessions. °· Avoiding activities that cause pain. °Treatment for more severe calcific tendinitis may require: °· Injecting cortisone steroids or pain relieving medicines into or around the joint. °· Manipulating the joint after you are given medicine to numb the area (local anesthetic). °· Inflating the joint with sterile fluid to increase the flexibility of the tendons. °· Shock wave therapy, which involves focusing sound  waves on the joint. °If other treatments do not work, surgery may be done to clean out the calcium deposits and repair the tendons where needed. Most people do not need surgery. °HOME CARE INSTRUCTIONS  °· Only take over-the-counter or prescription medicines for pain, fever, or discomfort as directed by your health care provider. °· Follow your health care provider's recommendations for activity and exercise. °SEEK MEDICAL CARE IF: °· You notice an increase in pain or numbness. °· You develop new weakness. °· You notice increased joint stiffness or a sensation of looseness in the joint. °· You notice increasing redness, swelling, or warmth around the joint area. °SEEK IMMEDIATE MEDICAL CARE IF: °· You have a fever or persistent symptoms for more than 2 to 3 days. °· You have a fever and your symptoms suddenly get worse. °MAKE SURE YOU: °· Understand these instructions. °· Will watch your condition. °· Will get help right away if you are not doing well or get worse. °  °This information is not intended to replace advice given to you by your health care provider. Make sure you discuss any questions you have with your health care provider. °  °Document Released: 12/03/2007 Document Revised: 11/14/2014 Document Reviewed: 06/04/2011 °Elsevier Interactive Patient Education ©2016 [employer] ° °

## 2015-09-12 NOTE — ED Notes (Signed)
Pt d/c by frank p, PA

## 2017-09-01 ENCOUNTER — Ambulatory Visit (HOSPITAL_COMMUNITY)
Admission: EM | Admit: 2017-09-01 | Discharge: 2017-09-01 | Disposition: A | Discharge status: Home / Self Care | Payer: Medicaid Other | Attending: Family Medicine | Admitting: Family Medicine

## 2017-09-01 ENCOUNTER — Encounter (HOSPITAL_COMMUNITY): Payer: Self-pay

## 2017-09-01 ENCOUNTER — Ambulatory Visit (INDEPENDENT_AMBULATORY_CARE_PROVIDER_SITE_OTHER): Payer: Medicaid Other

## 2017-09-01 DIAGNOSIS — M1811 Unilateral primary osteoarthritis of first carpometacarpal joint, right hand: Secondary | ICD-10-CM | POA: Diagnosis not present

## 2017-09-01 MED ORDER — MELOXICAM 7.5 MG PO TABS: 15.0000 mg | ORAL_TABLET | Freq: Every day | ORAL | 2 refills | Status: AC

## 2017-09-01 NOTE — ED Provider Notes (Signed)
[facility]    CSN: [id number] Arrival date & time: 09/01/17  1201     History   Chief Complaint No chief complaint on file.   HPI Jonathan Carney is a 57 y.o. male.   With history of hypertension. 2 weeks ago, he developed clicking or snapping of his right thumb at the PIP joint every time when he extend it. This is associated with mild pain. He reports to have full ROM. No swelling, injury or fever reported. He haven't try anything to help. He denies any alleviating or aggravating factors.      Past Medical History:  Diagnosis Date  . Hypertension     There are no active problems to display for this patient.   Past Surgical History:  Procedure Laterality Date  . EYE SURGERY  to straighten it out   Right       Home Medications    Prior to Admission medications   Medication Sig Start Date End Date Taking? Authorizing Provider  azithromycin (ZITHROMAX) 250 MG tablet Take 1 tablet (250 mg total) by mouth daily. Take first 2 tablets together, then 1 every day until finished. Patient not taking: Reported on 08/24/2015 07/08/15   Melton Krebsiley, Samantha Nicole, PA-C  cyclobenzaprine (FLEXERIL) 10 MG tablet Take 1 tablet (10 mg total) by mouth at bedtime. 08/24/15   Focht, Joyce CopaJessica L, PA  diphenhydrAMINE (BENADRYL) 25 MG tablet Take 1 tablet (25 mg total) by mouth every 6 (six) hours as needed for itching (Rash). Patient not taking: Reported on 08/24/2015 02/09/13   Muthersbaugh, Dahlia ClientHannah, PA-C  famotidine (PEPCID) 20 MG tablet Take 1 tablet (20 mg total) by mouth 2 (two) times daily. Patient not taking: Reported on 08/24/2015 02/16/15   Pisciotta, Joni ReiningNicole, PA-C  HYDROcodone-acetaminophen (NORCO) 7.5-325 MG tablet Take 1 tablet by mouth every 6 (six) hours as needed for moderate pain. 09/12/15   Tharon AquasPatrick, Frank C, PA  hydrOXYzine (ATARAX/VISTARIL) 25 MG tablet Take 1 tablet (25 mg total) by mouth every 4 (four) hours as needed for itching. Patient not taking: Reported on 08/24/2015  02/16/15   Pisciotta, Joni ReiningNicole, PA-C  loratadine (CLARITIN) 10 MG tablet Take 1 tablet (10 mg total) by mouth daily. Patient not taking: Reported on 08/24/2015 02/01/15   Dione BoozeGlick, David, MD  LORazepam (ATIVAN) 1 MG tablet Take 1 tablet (1 mg total) by mouth at bedtime as needed for sleep (and itching). Patient not taking: Reported on 08/24/2015 02/01/15   Dione BoozeGlick, David, MD  meloxicam (MOBIC) 7.5 MG tablet Take 2 tablets (15 mg total) by mouth daily. 09/01/17 10/01/17  Lucia EstelleZheng, Yasir Kitner, NP  predniSONE (DELTASONE) 20 MG tablet Take 3 tablets (60 mg total) by mouth daily. 07/08/15   Melton Krebsiley, Samantha Nicole, PA-C  PRESCRIPTION MEDICATION Take 1 tablet by mouth daily. Blood pressure    [provider]    Family History Family History  Problem Relation Age of Onset  . Healthy Mother   . Cancer Father     Social History Social History   Tobacco Use  . Smoking status: Current Every Day Smoker    Packs/day: 0.50    Types: Cigarettes  . Smokeless tobacco: Never Used  Substance Use Topics  . Alcohol use: No  . Drug use: No     Allergies   Patient has no known allergies.   Review of Systems Review of Systems  All other systems reviewed and are negative.    Physical Exam Triage Vital Signs ED Triage Vitals [09/01/17 1222]  Enc Vitals Group  BP (!) 163/110     Pulse Rate 78     Resp 19     Temp 99.3 F (37.4 C)     Temp src      SpO2 98 %     Weight      Height      Head Circumference      Peak Flow      Pain Score 3     Pain Loc      Pain Edu?      Excl. in GC?    No data found.  Updated Vital Signs BP (!) 163/110   Pulse 78   Temp 99.3 F (37.4 C)   Resp 19   SpO2 98%   Visual Acuity Right Eye Distance:   Left Eye Distance:   Bilateral Distance:    Right Eye Near:   Left Eye Near:    Bilateral Near:     Physical Exam  Constitutional: He is oriented to person, place, and time. He appears well-developed and well-nourished.  Cardiovascular: Normal rate  and regular rhythm.  Pulmonary/Chest: Effort normal and breath sounds normal.  Musculoskeletal:  Right thumb looks unremarkable without swelling, redness, deformity. Patient has full range of motion. There is a click with extension of the right PIP joint at the thumb, no click or snapping with flexion.   Neurological: He is alert and oriented to person, place, and time.  Nursing note and vitals reviewed.    UC Treatments / Results  Labs (all labs ordered are listed, but only abnormal results are displayed) Labs Reviewed - No data to display  EKG None  Radiology Dg Finger Thumb Right  Result Date: 09/01/2017 CLINICAL DATA:  IP joint pain with flexion and extension of joint x 2 weeks, no known injury. Pain only with flexion and extension, no pain when putting pressure on area. Pt notes popping in joint. No previous injury to area. Nondiabetic. EXAM: RIGHT THUMB 2+V COMPARISON:  12/14/2010 FINDINGS: There is degenerative change in the first metacarpophalangeal joint. There is no acute fracture or subluxation. No radiopaque foreign body or soft tissue gas. IMPRESSION: Mild degenerative changes at the first metacarpophalangeal joint otherwise negative. Electronically Signed   By: Norva Pavlov M.D.   On: 09/01/2017 13:02    Procedures Procedures (including critical care time)  Medications Ordered in UC Medications - No data to display  Initial Impression / Assessment and Plan / UC Course  I have reviewed the triage vital signs and the nursing notes.  Pertinent labs & imaging results that were available during my care of the patient were reviewed by me and considered in my medical decision making (see chart for details).  Final Clinical Impressions(s) / UC Diagnoses   Final diagnoses:  Degenerative arthritis of thumb, right   Discussed xray result with patient. He does have hx of arthritis. Meloxicam refilled. Please follow up with your PCP for no improvement.   Discharge  Instructions   None    ED Prescriptions    Medication Sig Dispense Auth. Provider   meloxicam (MOBIC) 7.5 MG tablet Take 2 tablets (15 mg total) by mouth daily. 30 tablet Lucia Estelle, NP     Controlled Substance Prescriptions [place] Controlled Substance Registry consulted? Not Applicable   Lucia Estelle, NP 09/01/17 1325

## 2017-09-01 NOTE — ED Triage Notes (Signed)
Pt presents with right thumb pain and "popping"

## 2017-12-31 ENCOUNTER — Other Ambulatory Visit: Payer: Self-pay | Admitting: Neurological Surgery

## 2017-12-31 DIAGNOSIS — M5412 Radiculopathy, cervical region: Secondary | ICD-10-CM

## 2018-01-17 ENCOUNTER — Ambulatory Visit
Admission: RE | Admit: 2018-01-17 | Discharge: 2018-01-17 | Disposition: A | Discharge status: Home / Self Care | Payer: Medicaid Other | Source: Ambulatory Visit | Attending: Neurological Surgery | Admitting: Neurological Surgery

## 2018-01-17 DIAGNOSIS — M5412 Radiculopathy, cervical region: Secondary | ICD-10-CM

## 2018-03-10 ENCOUNTER — Other Ambulatory Visit: Payer: Self-pay | Admitting: Neurological Surgery

## 2018-03-31 NOTE — Pre-Procedure Instructions (Signed)
Jonathan Carney  03/31/2018      [id number] - [place], [place] - [address] [place] [address] Phone: [phone] Fax: [phone]    Your procedure is scheduled on April 08, 2018.  Report to [facility] Admitting at 1140 AM.  Call this number if you have problems the morning of surgery:  [phone]   Remember:  Do not eat or drink after midnight.    Take these medicines the morning of surgery with A SIP OF WATER  Amlodipine (norvasc)  7 days prior to surgery STOP taking any Aspirin (unless otherwise instructed by your surgeon), Aleve, Naproxen, Ibuprofen, Motrin, Advil, Goody's, BC's, all herbal medications, fish oil, and all vitamins   Do not wear jewelry  Do not wear lotions, powders, or colognes, or deodorant.  Men may shave face and neck.  Do not bring valuables to the hospital.  [facility] is not responsible for any belongings or valuables.  Contacts, dentures or bridgework may not be worn into surgery.  Leave your suitcase in the car.  After surgery it may be brought to your room.  For patients admitted to the hospital, discharge time will be determined by your treatment team.  Patients discharged the day of surgery will not be allowed to drive home.    [place]- Preparing For Surgery  Before surgery, you can play an important role. Because skin is not sterile, your skin needs to be as free of germs as possible. You can reduce the number of germs on your skin by washing with CHG (chlorahexidine gluconate) Soap before surgery.  CHG is an antiseptic cleaner which kills germs and bonds with the skin to continue killing germs even after washing.    Oral Hygiene is also important to reduce your risk of infection.  Remember - BRUSH YOUR TEETH THE MORNING OF SURGERY WITH YOUR REGULAR TOOTHPASTE  Please do not use if you have an allergy to CHG or  antibacterial soaps. If your skin becomes reddened/irritated stop using the CHG.  Do not shave (including legs and underarms) for at least 48 hours prior to first CHG shower. It is OK to shave your face.  Please follow these instructions carefully.   1. Shower the NIGHT BEFORE SURGERY and the MORNING OF SURGERY with CHG.   2. If you chose to wash your hair, wash your hair first as usual with your normal shampoo.  3. After you shampoo, rinse your hair and body thoroughly to remove the shampoo.  4. Use CHG as you would any other liquid soap. You can apply CHG directly to the skin and wash gently with a scrungie or a clean washcloth.   5. Apply the CHG Soap to your body ONLY FROM THE NECK DOWN.  Do not use on open wounds or open sores. Avoid contact with your eyes, ears, mouth and genitals (private parts). Wash Face and genitals (private parts)  with your normal soap.  6. Wash thoroughly, paying special attention to the area where your surgery will be performed.  7. Thoroughly rinse your body with warm water from the neck down.  8. DO NOT shower/wash with your normal soap after using and rinsing off the CHG Soap.  9. Pat yourself dry with a CLEAN TOWEL.  10. Wear CLEAN PAJAMAS to bed the night before surgery, wear comfortable clothes the morning of surgery  11. Place CLEAN SHEETS on your  bed the night of your first shower and DO NOT SLEEP WITH PETS.  Day of Surgery:  Do not apply any deodorants/lotions.  Please wear clean clothes to the hospital/surgery center.   Remember to brush your teeth WITH YOUR REGULAR TOOTHPASTE.  Please read over the following fact sheets that you were given.

## 2018-03-31 NOTE — Progress Notes (Addendum)
PCP - Fleet ContrasEdwin Avbuere, MD Cardiologist - pt denies  Chest x-ray - pt denies past year, no recent respiratory infections/complications EKG - 04/01/2018  Stress Test - pt  denies ECHO - pt denies  Cardiac Cath - pt denies  Sleep Study - pt denies CPAP - n/a  Fasting Blood Sugar -  n/a Checks Blood Sugar _____ times a day-n/a  Blood Thinner Instructions: n/a Aspirin Instructions: n/a  Anesthesia review: pending labs  Patient denies shortness of breath, fever, cough and chest pain at PAT appointment  Patient verbalized understanding of instructions that were given to them at the PAT appointment. Patient was also instructed that they will need to review over the PAT instructions again at home before surgery.

## 2018-04-01 ENCOUNTER — Encounter (HOSPITAL_COMMUNITY): Payer: Self-pay

## 2018-04-01 ENCOUNTER — Other Ambulatory Visit: Payer: Self-pay

## 2018-04-01 ENCOUNTER — Encounter (HOSPITAL_COMMUNITY)
Admission: RE | Admit: 2018-04-01 | Discharge: 2018-04-01 | Disposition: A | Discharge status: Home / Self Care | Payer: Medicaid Other | Source: Ambulatory Visit | Attending: Neurological Surgery | Admitting: Neurological Surgery

## 2018-04-01 DIAGNOSIS — Z01818 Encounter for other preprocedural examination: Secondary | ICD-10-CM | POA: Diagnosis present

## 2018-04-01 DIAGNOSIS — R9431 Abnormal electrocardiogram [ECG] [EKG]: Secondary | ICD-10-CM | POA: Diagnosis not present

## 2018-04-01 DIAGNOSIS — M4802 Spinal stenosis, cervical region: Secondary | ICD-10-CM | POA: Diagnosis not present

## 2018-04-01 HISTORY — DX: Disorder of lipoprotein metabolism, unspecified: E78.9

## 2018-04-01 LAB — TYPE AND SCREEN
ABO/RH(D): O POS
Antibody Screen: NEGATIVE

## 2018-04-01 LAB — BASIC METABOLIC PANEL
Anion gap: 7 (ref 5–15)
BUN: 11 mg/dL (ref 6–20)
CO2: 24 mmol/L (ref 22–32)
CREATININE: 1.03 mg/dL (ref 0.61–1.24)
Calcium: 9.4 mg/dL (ref 8.9–10.3)
Chloride: 106 mmol/L (ref 98–111)
Glucose, Bld: 107 mg/dL — ABNORMAL HIGH (ref 70–99)
POTASSIUM: 3.9 mmol/L (ref 3.5–5.1)
SODIUM: 137 mmol/L (ref 135–145)

## 2018-04-01 LAB — CBC
HCT: 44 % (ref 39.0–52.0)
HEMOGLOBIN: 14.8 g/dL (ref 13.0–17.0)
MCH: 31.2 pg (ref 26.0–34.0)
MCHC: 33.6 g/dL (ref 30.0–36.0)
MCV: 92.8 fL (ref 80.0–100.0)
PLATELETS: 323 10*3/uL (ref 150–400)
RBC: 4.74 MIL/uL (ref 4.22–5.81)
RDW: 13.5 % (ref 11.5–15.5)
WBC: 5.2 10*3/uL (ref 4.0–10.5)
nRBC: 0 % (ref 0.0–0.2)

## 2018-04-01 LAB — SURGICAL PCR SCREEN
MRSA, PCR: NEGATIVE
STAPHYLOCOCCUS AUREUS: NEGATIVE

## 2018-04-01 LAB — ABO/RH: ABO/RH(D): O POS

## 2018-04-04 NOTE — Anesthesia Preprocedure Evaluation (Addendum)
Anesthesia Evaluation  Patient identified by MRN, date of birth, ID band Patient awake    Reviewed: Allergy & Precautions, NPO status , Patient's Chart, lab work & pertinent test results  History of Anesthesia Complications Negative for: history of anesthetic complications  Airway Mallampati: II  TM Distance: >3 FB Neck ROM: Full    Dental  (+) Poor Dentition, Chipped,    Pulmonary Current Smoker,    breath sounds clear to auscultation       Cardiovascular hypertension, Pt. on medications (-) angina(-) Past MI and (-) CHF  Rhythm:Regular     Neuro/Psych Foraminal stenosis of cervical region negative psych ROS   GI/Hepatic negative GI ROS, Neg liver ROS,   Endo/Other  Morbid obesity  Renal/GU negative Renal ROS     Musculoskeletal negative musculoskeletal ROS (+)   Abdominal   Peds  Hematology negative hematology ROS (+)   Anesthesia Other Findings   Reproductive/Obstetrics                           Anesthesia Physical Anesthesia Plan  ASA: II  Anesthesia Plan: General   Post-op Pain Management:    Induction: Intravenous  PONV Risk Score and Plan: 1 and Ondansetron and Dexamethasone  Airway Management Planned: Oral ETT  Additional Equipment: None  Intra-op Plan:   Post-operative Plan: Extubation in OR  Informed Consent: I have reviewed the patients History and Physical, chart, labs and discussed the procedure including the risks, benefits and alternatives for the proposed anesthesia with the patient or authorized representative who has indicated his/her understanding and acceptance.     Dental advisory given  Plan Discussed with: CRNA and Surgeon  Anesthesia Plan Comments: (PAT note written 04/04/2018 by Shonna Chock, PA-C. )       Anesthesia Quick Evaluation

## 2018-04-04 NOTE — Progress Notes (Signed)
Anesthesia Chart Review:  Case:  [id number] Date/Time:  04/08/18 1324   Procedure:  Cervical 5-6 Cervical 6-7 Anterior cervical decompression/discectomy/fusion (N/A ) - Cervical 5-6 Cervical 6-7 Anterior cervical decompression/discectomy/fusion   Anesthesia type:  General   Pre-op diagnosis:  Foraminal stenosis of cervical region   Location:  [facility] ROOM 19 / [facility]   Surgeon:  Tia Alert, MD      DISCUSSION: Patient is a 58 year old male scheduled for the above procedure.  History includes smoking, HTN, hypercholesterolemia ("borderline"). BMI is consistent with obesity.  EKG showed SR with non-specific T wave abnormality. He denied SOB, chest pain, cough, fever at his PAT RN visit. If no acute changes then I would anticipate that he could proceed as planned.   VS: BP (!) 144/92   Pulse 89   Temp 36.9 C (Oral)   Resp 18   Ht 6' (1.829 m)   Wt 118.9 kg   SpO2 98%   BMI 35.56 kg/m    PROVIDERS: Fleet Contras, MD is PCP    LABS: Labs reviewed: Acceptable for surgery. (all labs ordered are listed, but only abnormal results are displayed)  Labs Reviewed  BASIC METABOLIC PANEL - Abnormal; Notable for the following components:      Result Value   Glucose, Bld 107 (*)    All other components within normal limits  SURGICAL PCR SCREEN  CBC  TYPE AND SCREEN  ABO/RH    IMAGES: MRI c-spine 01/17/18: IMPRESSION: 1. No evident change from 04/14/2016. 2. Disc degeneration with uncovertebral ridging causes biforaminal impingement from C3-4 to C6-7. Sparing the left foramen at C4-5, foraminal narrowing is advanced at these levels. 3. Spinal stenosis from C3-4 to C5-6, greatest at C3-4 where there is mild ventral cord deformity.   EKG: 04/01/18: NSR. LAD. Non-specific T wave abnormality. He had non-specific ST/T wave abnormality inferior leads on 08/24/15 tracing, but probably new non-specific T wave abnormality in V4-6 on 04/01/18 tracing.    CV: N/A   Past Medical History:   Diagnosis Date  . Borderline high cholesterol   . Hypertension     Past Surgical History:  Procedure Laterality Date  . EYE SURGERY  to straighten it out   Right    MEDICATIONS: . amLODipine (NORVASC) 5 MG tablet   No current facility-administered medications for this encounter.     Shonna Chock, PA-C Surgical Short Stay/Anesthesiology [facility] Phone [phone] [facility] Phone [phone] 04/04/2018 12:52 PM

## 2018-04-07 MED ORDER — DEXTROSE 5 % IV SOLN
3.0000 g | INTRAVENOUS | Status: AC
  Administered 2018-04-08: 3 g via INTRAVENOUS
  Filled 2018-04-07 (×2): qty 3000

## 2018-04-08 ENCOUNTER — Encounter (HOSPITAL_COMMUNITY): Payer: Self-pay

## 2018-04-08 ENCOUNTER — Other Ambulatory Visit: Payer: Self-pay

## 2018-04-08 ENCOUNTER — Inpatient Hospital Stay (HOSPITAL_COMMUNITY): Payer: Medicaid Other

## 2018-04-08 ENCOUNTER — Encounter (HOSPITAL_COMMUNITY)
Admission: RE | Disposition: A | Discharge status: Home / Self Care | Payer: Self-pay | Source: Home / Self Care | Attending: Neurological Surgery

## 2018-04-08 ENCOUNTER — Observation Stay (HOSPITAL_COMMUNITY)
Admission: RE | Admit: 2018-04-08 | Discharge: 2018-04-09 | Disposition: A | Discharge status: Home / Self Care | Payer: Medicaid Other | Attending: Neurological Surgery | Admitting: Neurological Surgery

## 2018-04-08 ENCOUNTER — Inpatient Hospital Stay (HOSPITAL_COMMUNITY): Payer: Medicaid Other | Admitting: Certified Registered"

## 2018-04-08 ENCOUNTER — Inpatient Hospital Stay (HOSPITAL_COMMUNITY): Payer: Medicaid Other | Admitting: Vascular Surgery

## 2018-04-08 DIAGNOSIS — M47812 Spondylosis without myelopathy or radiculopathy, cervical region: Secondary | ICD-10-CM | POA: Insufficient documentation

## 2018-04-08 DIAGNOSIS — I10 Essential (primary) hypertension: Secondary | ICD-10-CM | POA: Diagnosis not present

## 2018-04-08 DIAGNOSIS — M2578 Osteophyte, vertebrae: Secondary | ICD-10-CM | POA: Diagnosis not present

## 2018-04-08 DIAGNOSIS — F17210 Nicotine dependence, cigarettes, uncomplicated: Secondary | ICD-10-CM | POA: Insufficient documentation

## 2018-04-08 DIAGNOSIS — M542 Cervicalgia: Secondary | ICD-10-CM | POA: Diagnosis present

## 2018-04-08 DIAGNOSIS — Z419 Encounter for procedure for purposes other than remedying health state, unspecified: Secondary | ICD-10-CM

## 2018-04-08 DIAGNOSIS — M50222 Other cervical disc displacement at C5-C6 level: Principal | ICD-10-CM | POA: Insufficient documentation

## 2018-04-08 DIAGNOSIS — Z981 Arthrodesis status: Secondary | ICD-10-CM

## 2018-04-08 DIAGNOSIS — M4802 Spinal stenosis, cervical region: Secondary | ICD-10-CM | POA: Diagnosis not present

## 2018-04-08 DIAGNOSIS — Z6835 Body mass index (BMI) 35.0-35.9, adult: Secondary | ICD-10-CM | POA: Insufficient documentation

## 2018-04-08 HISTORY — PX: ANTERIOR CERVICAL DECOMP/DISCECTOMY FUSION: SHX1161

## 2018-04-08 SURGERY — ANTERIOR CERVICAL DECOMPRESSION/DISCECTOMY FUSION 2 LEVELS
Anesthesia: General | Site: Spine Cervical

## 2018-04-08 MED ORDER — THROMBIN 5000 UNITS EX SOLR
OROMUCOSAL | Status: DC | PRN
  Administered 2018-04-08: 5 mL via TOPICAL

## 2018-04-08 MED ORDER — BUPIVACAINE HCL (PF) 0.25 % IJ SOLN
INTRAMUSCULAR | Status: DC | PRN
  Administered 2018-04-08: 5 mL

## 2018-04-08 MED ORDER — ONDANSETRON HCL 4 MG/2ML IJ SOLN
INTRAMUSCULAR | Status: AC
  Filled 2018-04-08: qty 4

## 2018-04-08 MED ORDER — SODIUM CHLORIDE 0.9 % IV SOLN
INTRAVENOUS | Status: DC | PRN
  Administered 2018-04-08: 500 mL

## 2018-04-08 MED ORDER — FENTANYL CITRATE (PF) 250 MCG/5ML IJ SOLN
INTRAMUSCULAR | Status: AC
  Filled 2018-04-08: qty 5

## 2018-04-08 MED ORDER — ROCURONIUM BROMIDE 10 MG/ML (PF) SYRINGE
PREFILLED_SYRINGE | INTRAVENOUS | Status: DC | PRN
  Administered 2018-04-08: 30 mg via INTRAVENOUS
  Administered 2018-04-08: 50 mg via INTRAVENOUS
  Administered 2018-04-08: 20 mg via INTRAVENOUS

## 2018-04-08 MED ORDER — OXYCODONE HCL 5 MG PO TABS
ORAL_TABLET | ORAL | Status: AC
  Administered 2018-04-08: 5 mg via ORAL
  Filled 2018-04-08: qty 1

## 2018-04-08 MED ORDER — MIDAZOLAM HCL 5 MG/5ML IJ SOLN
INTRAMUSCULAR | Status: DC | PRN
  Administered 2018-04-08: 2 mg via INTRAVENOUS

## 2018-04-08 MED ORDER — SUGAMMADEX SODIUM 200 MG/2ML IV SOLN
INTRAVENOUS | Status: DC | PRN
  Administered 2018-04-08: 250 mg via INTRAVENOUS

## 2018-04-08 MED ORDER — ACETAMINOPHEN 160 MG/5ML PO SOLN: 1000.0000 mg | Freq: Once | ORAL | Status: DC | PRN

## 2018-04-08 MED ORDER — DEXAMETHASONE SODIUM PHOSPHATE 4 MG/ML IJ SOLN
4.0000 mg | Freq: Four times a day (QID) | INTRAMUSCULAR | Status: DC
  Administered 2018-04-08: 4 mg via INTRAVENOUS
  Filled 2018-04-08: qty 1

## 2018-04-08 MED ORDER — METHOCARBAMOL 500 MG PO TABS
500.0000 mg | ORAL_TABLET | Freq: Four times a day (QID) | ORAL | Status: DC | PRN
  Administered 2018-04-08 (×2): 500 mg via ORAL
  Filled 2018-04-08: qty 1

## 2018-04-08 MED ORDER — SODIUM CHLORIDE 0.9% FLUSH
3.0000 mL | Freq: Two times a day (BID) | INTRAVENOUS | Status: DC
  Administered 2018-04-08: 3 mL via INTRAVENOUS

## 2018-04-08 MED ORDER — FENTANYL CITRATE (PF) 100 MCG/2ML IJ SOLN
25.0000 ug | INTRAMUSCULAR | Status: DC | PRN
  Administered 2018-04-08: 50 ug via INTRAVENOUS

## 2018-04-08 MED ORDER — CHLORHEXIDINE GLUCONATE CLOTH 2 % EX PADS: 6.0000 | MEDICATED_PAD | Freq: Once | CUTANEOUS | Status: DC

## 2018-04-08 MED ORDER — BUPIVACAINE HCL (PF) 0.25 % IJ SOLN
INTRAMUSCULAR | Status: AC
  Filled 2018-04-08: qty 30

## 2018-04-08 MED ORDER — LIDOCAINE 2% (20 MG/ML) 5 ML SYRINGE
INTRAMUSCULAR | Status: DC | PRN
  Administered 2018-04-08: 50 mg via INTRAVENOUS

## 2018-04-08 MED ORDER — ACETAMINOPHEN 650 MG RE SUPP: 650.0000 mg | RECTAL | Status: DC | PRN

## 2018-04-08 MED ORDER — SUCCINYLCHOLINE CHLORIDE 20 MG/ML IJ SOLN
INTRAMUSCULAR | Status: DC | PRN
  Administered 2018-04-08: 160 mg via INTRAVENOUS

## 2018-04-08 MED ORDER — DEXAMETHASONE 4 MG PO TABS
4.0000 mg | ORAL_TABLET | Freq: Four times a day (QID) | ORAL | Status: DC
  Administered 2018-04-08 – 2018-04-09 (×2): 4 mg via ORAL
  Filled 2018-04-08 (×2): qty 1

## 2018-04-08 MED ORDER — ONDANSETRON HCL 4 MG/2ML IJ SOLN: 4.0000 mg | Freq: Four times a day (QID) | INTRAMUSCULAR | Status: DC | PRN

## 2018-04-08 MED ORDER — MIDAZOLAM HCL 2 MG/2ML IJ SOLN
INTRAMUSCULAR | Status: AC
  Filled 2018-04-08: qty 2

## 2018-04-08 MED ORDER — MORPHINE SULFATE (PF) 2 MG/ML IV SOLN: 2.0000 mg | INTRAVENOUS | Status: DC | PRN

## 2018-04-08 MED ORDER — SODIUM CHLORIDE 0.9% FLUSH: 3.0000 mL | INTRAVENOUS | Status: DC | PRN

## 2018-04-08 MED ORDER — METHOCARBAMOL 500 MG PO TABS
ORAL_TABLET | ORAL | Status: AC
  Administered 2018-04-08: 500 mg via ORAL
  Filled 2018-04-08: qty 1

## 2018-04-08 MED ORDER — METHOCARBAMOL 500 MG PO TABS: 500.0000 mg | ORAL_TABLET | Freq: Four times a day (QID) | ORAL | 1 refills | Status: DC | PRN

## 2018-04-08 MED ORDER — ACETAMINOPHEN 325 MG PO TABS
650.0000 mg | ORAL_TABLET | ORAL | Status: DC | PRN
  Administered 2018-04-08 – 2018-04-09 (×2): 650 mg via ORAL
  Filled 2018-04-08 (×2): qty 2

## 2018-04-08 MED ORDER — OXYCODONE HCL 5 MG PO TABS
5.0000 mg | ORAL_TABLET | ORAL | Status: DC | PRN
  Administered 2018-04-08: 5 mg via ORAL
  Filled 2018-04-08: qty 1

## 2018-04-08 MED ORDER — 0.9 % SODIUM CHLORIDE (POUR BTL) OPTIME
TOPICAL | Status: DC | PRN
  Administered 2018-04-08: 1000 mL

## 2018-04-08 MED ORDER — FENTANYL CITRATE (PF) 100 MCG/2ML IJ SOLN
INTRAMUSCULAR | Status: DC | PRN
  Administered 2018-04-08: 100 ug via INTRAVENOUS
  Administered 2018-04-08: 50 ug via INTRAVENOUS

## 2018-04-08 MED ORDER — METHOCARBAMOL 1000 MG/10ML IJ SOLN
500.0000 mg | Freq: Four times a day (QID) | INTRAVENOUS | Status: DC | PRN
  Filled 2018-04-08: qty 5

## 2018-04-08 MED ORDER — PHENOL 1.4 % MT LIQD
1.0000 | OROMUCOSAL | Status: DC | PRN
  Administered 2018-04-08: 1 via OROMUCOSAL
  Filled 2018-04-08: qty 177

## 2018-04-08 MED ORDER — OXYCODONE HCL 5 MG/5ML PO SOLN: 5.0000 mg | Freq: Once | ORAL | Status: AC | PRN

## 2018-04-08 MED ORDER — CEFAZOLIN SODIUM-DEXTROSE 2-4 GM/100ML-% IV SOLN: 2.0000 g | Freq: Three times a day (TID) | INTRAVENOUS | Status: DC

## 2018-04-08 MED ORDER — SODIUM CHLORIDE 0.9 % IV SOLN
INTRAVENOUS | Status: DC | PRN
  Administered 2018-04-08: 20 ug/min via INTRAVENOUS

## 2018-04-08 MED ORDER — OXYCODONE HCL 5 MG PO TABS: 5.0000 mg | ORAL_TABLET | ORAL | 0 refills | Status: DC | PRN

## 2018-04-08 MED ORDER — POTASSIUM CHLORIDE IN NACL 20-0.9 MEQ/L-% IV SOLN: INTRAVENOUS | Status: DC

## 2018-04-08 MED ORDER — SENNA 8.6 MG PO TABS
1.0000 | ORAL_TABLET | Freq: Two times a day (BID) | ORAL | Status: DC
  Administered 2018-04-08 (×2): 8.6 mg via ORAL
  Filled 2018-04-08 (×2): qty 1

## 2018-04-08 MED ORDER — SODIUM CHLORIDE 0.9 % IV SOLN: 250.0000 mL | INTRAVENOUS | Status: DC

## 2018-04-08 MED ORDER — ONDANSETRON HCL 4 MG PO TABS: 4.0000 mg | ORAL_TABLET | Freq: Four times a day (QID) | ORAL | Status: DC | PRN

## 2018-04-08 MED ORDER — FENTANYL CITRATE (PF) 100 MCG/2ML IJ SOLN
INTRAMUSCULAR | Status: AC
  Administered 2018-04-08: 50 ug via INTRAVENOUS
  Filled 2018-04-08: qty 2

## 2018-04-08 MED ORDER — ACETAMINOPHEN 10 MG/ML IV SOLN: 1000.0000 mg | Freq: Once | INTRAVENOUS | Status: DC | PRN

## 2018-04-08 MED ORDER — DEXAMETHASONE SODIUM PHOSPHATE 10 MG/ML IJ SOLN
INTRAMUSCULAR | Status: AC
  Filled 2018-04-08: qty 2

## 2018-04-08 MED ORDER — ACETAMINOPHEN 500 MG PO TABS: 1000.0000 mg | ORAL_TABLET | Freq: Once | ORAL | Status: DC | PRN

## 2018-04-08 MED ORDER — MENTHOL 3 MG MT LOZG: 1.0000 | LOZENGE | OROMUCOSAL | Status: DC | PRN

## 2018-04-08 MED ORDER — PROPOFOL 10 MG/ML IV BOLUS
INTRAVENOUS | Status: AC
  Filled 2018-04-08: qty 20

## 2018-04-08 MED ORDER — PROPOFOL 10 MG/ML IV BOLUS
INTRAVENOUS | Status: DC | PRN
  Administered 2018-04-08: 200 mg via INTRAVENOUS

## 2018-04-08 MED ORDER — AMLODIPINE BESYLATE 5 MG PO TABS: 5.0000 mg | ORAL_TABLET | Freq: Every day | ORAL | Status: DC

## 2018-04-08 MED ORDER — OXYCODONE HCL 5 MG PO TABS
5.0000 mg | ORAL_TABLET | Freq: Once | ORAL | Status: AC | PRN
  Administered 2018-04-08: 5 mg via ORAL

## 2018-04-08 MED ORDER — LACTATED RINGERS IV SOLN
INTRAVENOUS | Status: DC
  Administered 2018-04-08: 11:00:00 via INTRAVENOUS

## 2018-04-08 MED ORDER — DEXAMETHASONE SODIUM PHOSPHATE 10 MG/ML IJ SOLN
INTRAMUSCULAR | Status: DC | PRN
  Administered 2018-04-08: 10 mg via INTRAVENOUS

## 2018-04-08 MED ORDER — THROMBIN 5000 UNITS EX SOLR
CUTANEOUS | Status: AC
  Filled 2018-04-08: qty 5000

## 2018-04-08 SURGICAL SUPPLY — 61 items
APL SKNCLS STERI-STRIP NONHPOA (GAUZE/BANDAGES/DRESSINGS) ×1
BAG DECANTER FOR FLEXI CONT (MISCELLANEOUS) ×3 IMPLANT
BASKET BONE COLLECTION (BASKET) ×2 IMPLANT
BENZOIN TINCTURE PRP APPL 2/3 (GAUZE/BANDAGES/DRESSINGS) ×3 IMPLANT
BIT DRILL 16MM (BIT) IMPLANT
BUR MATCHSTICK NEURO 3.0 LAGG (BURR) ×3 IMPLANT
CANISTER SUCT 3000ML PPV (MISCELLANEOUS) ×3 IMPLANT
CARTRIDGE OIL MAESTRO DRILL (MISCELLANEOUS) ×1 IMPLANT
CLOSURE WOUND 1/2 X4 (GAUZE/BANDAGES/DRESSINGS) ×1
COVER WAND RF STERILE (DRAPES) ×1 IMPLANT
DIFFUSER DRILL AIR PNEUMATIC (MISCELLANEOUS) ×3 IMPLANT
DRAPE C-ARM 42X72 X-RAY (DRAPES) ×6 IMPLANT
DRAPE LAPAROTOMY 100X72 PEDS (DRAPES) ×3 IMPLANT
DRAPE MICROSCOPE LEICA (MISCELLANEOUS) ×3 IMPLANT
DRILL 16MM (BIT) ×3
DRSG OPSITE POSTOP 4X6 (GAUZE/BANDAGES/DRESSINGS) ×2 IMPLANT
DURAPREP 6ML APPLICATOR 50/CS (WOUND CARE) ×3 IMPLANT
ELECT COATED BLADE 2.86 ST (ELECTRODE) ×3 IMPLANT
ELECT REM PT RETURN 9FT ADLT (ELECTROSURGICAL) ×3
ELECTRODE REM PT RTRN 9FT ADLT (ELECTROSURGICAL) ×1 IMPLANT
GAUZE 4X4 16PLY RFD (DISPOSABLE) IMPLANT
GLOVE BIO SURGEON STRL SZ7 (GLOVE) ×2 IMPLANT
GLOVE BIO SURGEON STRL SZ8 (GLOVE) ×3 IMPLANT
GLOVE BIOGEL PI IND STRL 6.5 (GLOVE) IMPLANT
GLOVE BIOGEL PI IND STRL 7.0 (GLOVE) IMPLANT
GLOVE BIOGEL PI IND STRL 8 (GLOVE) IMPLANT
GLOVE BIOGEL PI INDICATOR 6.5 (GLOVE) ×2
GLOVE BIOGEL PI INDICATOR 7.0 (GLOVE) ×2
GLOVE BIOGEL PI INDICATOR 8 (GLOVE) ×8
GLOVE ECLIPSE 7.5 STRL STRAW (GLOVE) ×6 IMPLANT
GLOVE SURG SS PI 6.0 STRL IVOR (GLOVE) ×2 IMPLANT
GOWN STRL REUS W/ TWL LRG LVL3 (GOWN DISPOSABLE) IMPLANT
GOWN STRL REUS W/ TWL XL LVL3 (GOWN DISPOSABLE) ×1 IMPLANT
GOWN STRL REUS W/TWL 2XL LVL3 (GOWN DISPOSABLE) ×4 IMPLANT
GOWN STRL REUS W/TWL LRG LVL3 (GOWN DISPOSABLE) ×6
GOWN STRL REUS W/TWL XL LVL3 (GOWN DISPOSABLE) ×3
HEMOSTAT POWDER KIT SURGIFOAM (HEMOSTASIS) ×3 IMPLANT
KIT BASIN OR (CUSTOM PROCEDURE TRAY) ×3 IMPLANT
KIT TURNOVER KIT B (KITS) ×3 IMPLANT
NDL HYPO 25X1 1.5 SAFETY (NEEDLE) ×1 IMPLANT
NDL SPNL 20GX3.5 QUINCKE YW (NEEDLE) ×1 IMPLANT
NEEDLE HYPO 25X1 1.5 SAFETY (NEEDLE) ×3 IMPLANT
NEEDLE SPNL 20GX3.5 QUINCKE YW (NEEDLE) ×3 IMPLANT
NS IRRIG 1000ML POUR BTL (IV SOLUTION) ×3 IMPLANT
OIL CARTRIDGE MAESTRO DRILL (MISCELLANEOUS) ×3
PACK LAMINECTOMY NEURO (CUSTOM PROCEDURE TRAY) ×3 IMPLANT
PAD ARMBOARD 7.5X6 YLW CONV (MISCELLANEOUS) ×9 IMPLANT
PIN DISTRACTION 14MM (PIN) ×6 IMPLANT
PLATE 34MM (Plate) ×2 IMPLANT
PUTTY BONE 2.5CC ×2 IMPLANT
RUBBERBAND STERILE (MISCELLANEOUS) ×6 IMPLANT
SCREW VA ST 16 (Screw) ×12 IMPLANT
SPACER PTI-C 8X18X16 7D (Spacer) ×4 IMPLANT
SPONGE INTESTINAL PEANUT (DISPOSABLE) ×3 IMPLANT
SPONGE SURGIFOAM ABS GEL SZ50 (HEMOSTASIS) ×1 IMPLANT
STRIP CLOSURE SKIN 1/2X4 (GAUZE/BANDAGES/DRESSINGS) ×2 IMPLANT
SUT VIC AB 3-0 SH 8-18 (SUTURE) ×4 IMPLANT
SUT VICRYL 4-0 PS2 18IN ABS (SUTURE) ×2 IMPLANT
TOWEL GREEN STERILE (TOWEL DISPOSABLE) ×3 IMPLANT
TOWEL GREEN STERILE FF (TOWEL DISPOSABLE) ×3 IMPLANT
WATER STERILE IRR 1000ML POUR (IV SOLUTION) ×3 IMPLANT

## 2018-04-08 NOTE — H&P (Signed)
Subjective:   Patient is a 58 y.o. male admitted for neck and arm pain. The patient first presented to me with complaints of neck pain, shooting pains in the arm(s) and numbness of the arm(s). Onset of symptoms was several months ago. The pain is described as aching and occurs all day. The pain is rated severe, and is located in the neck and radiates to the arms. The symptoms have been progressive. Symptoms are exacerbated by extending head backwards, and are relieved by none.  Previous work up includes MRI of cervical spine, results: spinal stenosis.  Past Medical History:  Diagnosis Date  . Borderline high cholesterol   . Hypertension     Past Surgical History:  Procedure Laterality Date  . EYE SURGERY  to straighten it out   Right    No Known Allergies  Social History   Tobacco Use  . Smoking status: Current Every Day Smoker    Packs/day: 0.50    Types: Cigarettes  . Smokeless tobacco: Never Used  Substance Use Topics  . Alcohol use: No    Family History  Problem Relation Age of Onset  . Healthy Mother   . Cancer Father    Prior to Admission medications   Medication Sig Start Date End Date Taking? Authorizing Provider  amLODipine (NORVASC) 5 MG tablet Take 5 mg by mouth daily.   Yes [provider]     Review of Systems  Positive ROS: neg  All other systems have been reviewed and were otherwise negative with the exception of those mentioned in the HPI and as above.  Objective: Vital signs in last 24 hours: Temp:  [98 F (36.7 C)] 98 F (36.7 C) (01/31 1035) Pulse Rate:  [94] 94 (01/31 1035) Resp:  [18] 18 (01/31 1035) BP: (126)/(89) 126/89 (01/31 1045) SpO2:  [99 %] 99 % (01/31 1035) Weight:  [118.9 kg] 118.9 kg (01/31 1042)  General Appearance: Alert, cooperative, no distress, appears stated age Head: Normocephalic, without obvious abnormality, atraumatic Eyes: PERRL, conjunctiva/corneas clear, EOM's intact      Neck: Supple, symmetrical, trachea  midline, Back: Symmetric, no curvature, ROM normal, no CVA tenderness Lungs:  respirations unlabored Heart: Regular rate and rhythm Abdomen: Soft, non-tender Extremities: Extremities normal, atraumatic, no cyanosis or edema Pulses: 2+ and symmetric all extremities Skin: Skin color, texture, turgor normal, no rashes or lesions  NEUROLOGIC:  Mental status: Alert and oriented x4, no aphasia, good attention span, fund of knowledge and memory  Motor Exam - grossly normal Sensory Exam - grossly normal Reflexes: 1+ Coordination - grossly normal Gait - grossly normal Balance - grossly normal Cranial Nerves: I: smell Not tested  II: visual acuity  OS: nl    OD: nl  II: visual fields Full to confrontation  II: pupils Equal, round, reactive to light  III,VII: ptosis None  III,IV,VI: extraocular muscles  Full ROM  V: mastication Normal  V: facial light touch sensation  Normal  V,VII: corneal reflex  Present  VII: facial muscle function - upper  Normal  VII: facial muscle function - lower Normal  VIII: hearing Not tested  IX: soft palate elevation  Normal  IX,X: gag reflex Present  XI: trapezius strength  5/5  XI: sternocleidomastoid strength 5/5  XI: neck flexion strength  5/5  XII: tongue strength  Normal    Data Review Lab Results  Component Value Date   WBC 5.2 04/01/2018   HGB 14.8 04/01/2018   HCT 44.0 04/01/2018   MCV 92.8 04/01/2018  PLT 323 04/01/2018   Lab Results  Component Value Date   NA 137 04/01/2018   K 3.9 04/01/2018   CL 106 04/01/2018   CO2 24 04/01/2018   BUN 11 04/01/2018   CREATININE 1.03 04/01/2018   GLUCOSE 107 (H) 04/01/2018   No results found for: INR, PROTIME  Assessment:   Cervical neck pain with herniated nucleus pulposus/ spondylosis/ stenosis at C5-6 C6-7. Estimated body mass index is 35.56 kg/m as calculated from the following:   Height as of this encounter: 6' (1.829 m).   Weight as of this encounter: 118.9 kg.  Patient has failed  conservative therapy. Planned surgery : ACDF C5-6 C6-7  Plan:   I explained the condition and procedure to the patient and answered any questions.  Patient wishes to proceed with procedure as planned. Understands risks/ benefits/ and expected or typical outcomes.  Tia Alert 04/08/2018 11:11 AM

## 2018-04-08 NOTE — Progress Notes (Deleted)
Patient A/Ox4 and in no s/s apparent distress.  Pain well-controlled with his prn pain regimen.  Incision site to neck CDI--- honeycomb dressing clean, dry and intact.  Discharge instructions provided to patient and significant other; prescriptions sent to pt's pharmacy of choice by MD.  Pt was discharged home in good condition, transported per wheelchair, accompanied by family.

## 2018-04-08 NOTE — Anesthesia Procedure Notes (Signed)
Procedure Name: Intubation Date/Time: 04/08/2018 12:18 PM Performed by: Lanell Matar, CRNA Pre-anesthesia Checklist: Patient identified, Emergency Drugs available, Suction available and Patient being monitored Patient Re-evaluated:Patient Re-evaluated prior to induction Oxygen Delivery Method: Circle System Utilized Preoxygenation: Pre-oxygenation with 100% oxygen Induction Type: IV induction Ventilation: Oral airway inserted - appropriate to patient size and Mask ventilation without difficulty Laryngoscope Size: Glidescope and 4 Grade View: Grade I Tube type: Oral Tube size: 7.5 mm Number of attempts: 2 Airway Equipment and Method: Stylet,  Oral airway and Video-laryngoscopy Placement Confirmation: ETT inserted through vocal cords under direct vision,  positive ETCO2 and breath sounds checked- equal and bilateral Secured at: 22 cm Tube secured with: Tape Dental Injury: Teeth and Oropharynx as per pre-operative assessment  Difficulty Due To: Difficult Airway- due to reduced neck mobility and Difficult Airway- due to limited oral opening Future Recommendations: Recommend- induction with short-acting agent, and alternative techniques readily available Comments: DL with Hyacinth Meeker 2 by CRNA, no real view obtained. Neck unable to extend much at all and mouth very stiff and difficult to open. Glidescope obtained. Grade 1 view. Moderately easy to place ETT. +ETCO2, BBS=.

## 2018-04-08 NOTE — Discharge Summary (Signed)
Physician Discharge Summary  Patient ID: Jonathan Carney MRN: [id number] DOB/AGE: 1960/04/04 58 y.o.  Admit date: 04/08/2018 Discharge date: 04/08/2018  Admission Diagnoses: cervical spondylosis    Discharge Diagnoses: same   Discharged Condition: good  Hospital Course: The patient was admitted on 04/08/2018 and taken to the operating room where the patient underwent ACDF. The patient tolerated the procedure well and was taken to the recovery room and then to the floor in stable condition. The hospital course was routine. There were no complications. The wound remained clean dry and intact. Pt had appropriate neck soreness. No complaints of arm pain or new N/T/W. The patient remained afebrile with stable vital signs, and tolerated a regular diet. The patient continued to increase activities, and pain was well controlled with oral pain medications.   Consults: none  Significant Diagnostic Studies:  Results for orders placed or performed during the hospital encounter of 04/01/18  Surgical pcr screen  Result Value Ref Range   MRSA, PCR NEGATIVE NEGATIVE   Staphylococcus aureus NEGATIVE NEGATIVE  CBC  Result Value Ref Range   WBC 5.2 4.0 - 10.5 K/uL   RBC 4.74 4.22 - 5.81 MIL/uL   Hemoglobin 14.8 13.0 - 17.0 g/dL   HCT 00.7 62.2 - 63.3 %   MCV 92.8 80.0 - 100.0 fL   MCH 31.2 26.0 - 34.0 pg   MCHC 33.6 30.0 - 36.0 g/dL   RDW 35.4 56.2 - 56.3 %   Platelets 323 150 - 400 K/uL   nRBC 0.0 0.0 - 0.2 %  Basic metabolic panel  Result Value Ref Range   Sodium 137 135 - 145 mmol/L   Potassium 3.9 3.5 - 5.1 mmol/L   Chloride 106 98 - 111 mmol/L   CO2 24 22 - 32 mmol/L   Glucose, Bld 107 (H) 70 - 99 mg/dL   BUN 11 6 - 20 mg/dL   Creatinine, Ser 8.93 0.61 - 1.24 mg/dL   Calcium 9.4 8.9 - 73.4 mg/dL   GFR calc non Af Amer >60 >60 mL/min   GFR calc Af Amer >60 >60 mL/min   Anion gap 7 5 - 15  Type and screen  Result Value Ref Range   ABO/RH(D) O POS    Antibody Screen NEG    Sample  Expiration 04/15/2018    Extend sample reason      NO TRANSFUSIONS OR PREGNANCY IN THE PAST 3 MONTHS Performed at [facility], [address], [place], [place] [address]   ABO/Rh  Result Value Ref Range   ABO/RH(D)      O POS Performed at [facility], [address], [place], [place] [address]     Dg Cervical Spine 2-3 Views  Result Date: 04/08/2018 CLINICAL DATA:  Cervical spine fusion. EXAM: CERVICAL SPINE - 2-3 VIEW; DG C-ARM 61-120 MIN COMPARISON:  No recent. FINDINGS: C5-C6 and C6-C7 anterior and interbody fusion. Lower cervical spine difficult to evaluate on lateral view due to technique. On the AP view anatomic alignment noted. Screw is noted just to the right of the C6 vertebral hardware. Three images obtained. 0 minutes 16 seconds fluoroscopy time utilized. IMPRESSION: C5-C6-C6-C7 anterior interbody fusion. Electronically Signed   By: Maisie Fus  Register   On: 04/08/2018 14:23   Dg C-arm 1-60 Min  Result Date: 04/08/2018 CLINICAL DATA:  Cervical spine fusion. EXAM: CERVICAL SPINE - 2-3 VIEW; DG C-ARM 61-120 MIN COMPARISON:  No recent. FINDINGS: C5-C6 and C6-C7 anterior and interbody fusion. Lower cervical spine difficult  to evaluate on lateral view due to technique. On the AP view anatomic alignment noted. Screw is noted just to the right of the C6 vertebral hardware. Three images obtained. 0 minutes 16 seconds fluoroscopy time utilized. IMPRESSION: C5-C6-C6-C7 anterior interbody fusion. Electronically Signed   By: Maisie Fus  Register   On: 04/08/2018 14:23    Antibiotics:  Anti-infectives (From admission, onward)   Start     Dose/Rate Route Frequency Ordered Stop   04/08/18 2030  ceFAZolin (ANCEF) IVPB 2g/100 mL premix     2 g 200 mL/hr over 30 Minutes Intravenous Every 8 hours 04/08/18 1531 04/09/18 1229   04/08/18 1255  bacitracin 50,000 Units in sodium chloride 0.9 % 500 mL irrigation  Status:  Discontinued       As needed 04/08/18 1255 04/08/18 1414   04/08/18 0600   ceFAZolin (ANCEF) 3 g in dextrose 5 % 50 mL IVPB     3 g 100 mL/hr over 30 Minutes Intravenous On call to O.R. 04/07/18 1833 04/08/18 1615      Discharge Exam: Blood pressure (!) 139/94, pulse 81, temperature (!) 97.5 F (36.4 C), temperature source Oral, resp. rate 18, height 6' (1.829 m), weight 118.9 kg, SpO2 94 %. Neurologic: Grossly normal Dressing dry  Discharge Medications:   Allergies as of 04/08/2018   No Known Allergies     Medication List    TAKE these medications   amLODipine 5 MG tablet Commonly known as:  NORVASC Take 5 mg by mouth daily.   methocarbamol 500 MG tablet Commonly known as:  ROBAXIN Take 1 tablet (500 mg total) by mouth every 6 (six) hours as needed for muscle spasms.   oxyCODONE 5 MG immediate release tablet Commonly known as:  Oxy IR/ROXICODONE Take 1 tablet (5 mg total) by mouth every 4 (four) hours as needed for moderate pain ((score 4 to 6)).       Disposition: home   Final Dx: ACDF  Discharge Instructions    Call MD for:  difficulty breathing, headache or visual disturbances   Complete by:  As directed    Call MD for:  persistant nausea and vomiting   Complete by:  As directed    Call MD for:  redness, tenderness, or signs of infection (pain, swelling, redness, odor or green/yellow discharge around incision site)   Complete by:  As directed    Call MD for:  severe uncontrolled pain   Complete by:  As directed    Call MD for:  temperature >100.4   Complete by:  As directed    Diet - low sodium heart healthy   Complete by:  As directed    Increase activity slowly   Complete by:  As directed    Remove dressing in 48 hours   Complete by:  As directed       Follow-up Information    Tia Alert, MD. Schedule an appointment as soon as possible for a visit in 2 week(s).   Specialty:  Neurosurgery Contact information: [address] [place] [address] [phone]            Signed: Tia Alert 04/08/2018, 6:56 PM

## 2018-04-08 NOTE — Discharge Instructions (Signed)

## 2018-04-08 NOTE — Transfer of Care (Signed)
Immediate Anesthesia Transfer of Care Note  Patient: Jonathan Carney  Procedure(s) Performed: Cervical Five-Six, Cervical Six-Seven Anterior cervical decompression/discectomy/fusion (N/A Spine Cervical)  Patient Location: PACU  Anesthesia Type:General  Level of Consciousness: awake, drowsy and patient cooperative  Airway & Oxygen Therapy: Patient Spontanous Breathing  Post-op Assessment: Report given to RN and Post -op Vital signs reviewed and stable  Post vital signs: Reviewed and stable  Last Vitals:  Vitals Value Taken Time  BP 139/72 04/08/2018  2:19 PM  Temp    Pulse 74 04/08/2018  2:21 PM  Resp 11 04/08/2018  2:21 PM  SpO2 93 % 04/08/2018  2:21 PM  Vitals shown include unvalidated device data.  Last Pain:  Vitals:   04/08/18 1042  TempSrc:   PainSc: 10-Worst pain ever      Patients Stated Pain Goal: 2 (04/08/18 1042)  Complications: No apparent anesthesia complications

## 2018-04-08 NOTE — Op Note (Signed)
04/08/2018  2:04 PM  PATIENT:  Jonathan Carney  58 y.o. male  PRE-OPERATIVE DIAGNOSIS: Cervical spondylosis with cervical spinal stenosis C5-6 C6-7 with neck and arm pain  POST-OPERATIVE DIAGNOSIS:  same  PROCEDURE:  1. Decompressive anterior cervical discectomy C5-6 C6-7, 2. Anterior cervical arthrodesis 5 6 C6-7 utilizing a porous titanium interbody cage packed with locally harvested morcellized autologous bone graft, 3. Anterior cervical plating C5-C7 inclusive utilizing a Alphatec plate  SURGEON:  Marikay Alaravid Essex Perry, MD  ASSISTANTS: Adelene Idlermeyran FNP  ANESTHESIA:   General  EBL: 75 ml  Total I/O In: -  Out: 75 [Blood:75]  BLOOD ADMINISTERED: none  DRAINS: none  SPECIMEN:  none  INDICATION FOR PROCEDURE: This patient presented with neck and bilateral arm pain. Imaging showed difficult spondylosis with stenosis. The patient tried conservative measures without relief. Pain was debilitating. Recommended ACDF with plating. Patient understood the risks, benefits, and alternatives and potential outcomes and wished to proceed.  PROCEDURE DETAILS: Patient was brought to the operating room placed under general endotracheal anesthesia. Patient was placed in the supine position on the operating room table. The neck was prepped with Duraprep and draped in a sterile fashion.   Three cc of local anesthesia was injected and a transverse incision was made on the right side of the neck.  Dissection was carried down thru the subcutaneous tissue and the platysma was  elevated, opened, and undermined with Metzenbaum scissors.  Dissection was then carried out thru an avascular plane leaving the sternocleidomastoid carotid artery and jugular vein laterally and the trachea and esophagus medially. The ventral aspect of the vertebral column was identified and a localizing x-ray was taken. The C5-6 level was identified. The longus colli muscles were then elevated and the retractor was placed to expose C5-6 and C6-7. The  annulus was incised at both levels and the disc space entered. Discectomy was performed with micro-curettes and pituitary rongeurs. I then used the high-speed drill to drill the endplates down to the level of the posterior longitudinal ligament. The drill shavings were saved in a mucous trap for later arthrodesis. The operating microscope was draped and brought into the field provided additional magnification, illumination and visualization. Discectomy was continued posteriorly thru the disc space at both levels. Posterior longitudinal ligament was opened with a nerve hook, and then removed along with disc herniation and osteophytes, decompressing the spinal canal and thecal sac. We then continued to remove osteophytic overgrowth and disc material decompressing the neural foramina and exiting nerve roots bilaterally. The scope was angled up and down to help decompress and undercut the vertebral bodies. Once the decompression was completed we could pass a nerve hook circumferentially to assure adequate decompression in the midline and in the neural foramina. So by both visualization and palpation we felt we had an adequate decompression of the neural elements. We then measured the height of the intravertebral disc space and selected a 8 millimeter porous titanium interbody cage packed with autograft and DBM for extra volume. It was then gently positioned in the intravertebral disc spaces and countersunk. I then used a 34 mm plate and placed variable angle screws into the vertebral bodies of each level and locked them into position. The wound was irrigated with bacitracin solution, checked for hemostasis which was established and confirmed. Once meticulous hemostasis was achieved, we then proceeded with closure. The platysma was closed with interrupted 3-0 undyed Vicryl suture, the subcuticular layer was closed with interrupted 3-0 undyed Vicryl suture. The skin edges were approximated with steristrips.  The drapes  were removed. A sterile dressing was applied. The patient was then awakened from general anesthesia and transferred to the recovery room in stable condition. At the end of the procedure all sponge, needle and instrument counts were correct.   PLAN OF CARE: Admit for overnight observation  PATIENT DISPOSITION:  PACU - hemodynamically stable.   Delay start of Pharmacological VTE agent (>24hrs) due to surgical blood loss or risk of bleeding:  yes

## 2018-04-09 DIAGNOSIS — M50222 Other cervical disc displacement at C5-C6 level: Secondary | ICD-10-CM | POA: Diagnosis not present

## 2018-04-09 NOTE — Anesthesia Postprocedure Evaluation (Signed)
Anesthesia Post Note  Patient: Jonathan Carney  Procedure(s) Performed: Cervical Five-Six, Cervical Six-Seven Anterior cervical decompression/discectomy/fusion (N/A Spine Cervical)     Patient location during evaluation: PACU Anesthesia Type: General Level of consciousness: awake and alert Pain management: pain level controlled Vital Signs Assessment: post-procedure vital signs reviewed and stable Respiratory status: spontaneous breathing, nonlabored ventilation, respiratory function stable and patient connected to nasal cannula oxygen Cardiovascular status: blood pressure returned to baseline and stable Postop Assessment: no apparent nausea or vomiting Anesthetic complications: no    Last Vitals:  Vitals:   04/08/18 1505 04/08/18 1551  BP: 125/77 (!) 139/94  Pulse: 77 81  Resp: 12 18  Temp: 37.3 C (!) 36.4 C  SpO2: 99% 94%    Last Pain:  Vitals:   04/08/18 2000  TempSrc:   PainSc: 5                  Leilany Digeronimo

## 2018-04-09 NOTE — Progress Notes (Signed)
Patient alert and oriented, mae's well, voiding adequate amount of urine, swallowing without difficulty,  c/o soreness at time of discharge and ice pack place on incision site. Patient discharged home with family. Script and discharged instructions given to patient. Patient and family stated understanding of instructions given. Patient has an appointment with Dr. Yetta Barre in two weeks.

## 2018-04-12 ENCOUNTER — Encounter (HOSPITAL_COMMUNITY): Payer: Self-pay | Admitting: Neurological Surgery

## 2019-07-03 ENCOUNTER — Ambulatory Visit: Payer: Medicaid Other | Attending: Internal Medicine

## 2019-07-12 ENCOUNTER — Ambulatory Visit: Payer: Medicaid Other | Attending: Internal Medicine

## 2019-07-12 DIAGNOSIS — Z20822 Contact with and (suspected) exposure to covid-19: Secondary | ICD-10-CM

## 2019-07-14 LAB — SARS-COV-2, NAA 2 DAY TAT

## 2019-07-14 LAB — NOVEL CORONAVIRUS, NAA: SARS-CoV-2, NAA: NOT DETECTED

## 2020-05-08 ENCOUNTER — Other Ambulatory Visit: Payer: Self-pay | Admitting: Internal Medicine

## 2020-05-09 LAB — CBC
HCT: 39.5 % (ref 38.5–50.0)
Hemoglobin: 13.5 g/dL (ref 13.2–17.1)
MCH: 31 pg (ref 27.0–33.0)
MCHC: 34.2 g/dL (ref 32.0–36.0)
MCV: 90.6 fL (ref 80.0–100.0)
MPV: 9.7 fL (ref 7.5–12.5)
Platelets: 357 10*3/uL (ref 140–400)
RBC: 4.36 10*6/uL (ref 4.20–5.80)
RDW: 12.8 % (ref 11.0–15.0)
WBC: 7.5 10*3/uL (ref 3.8–10.8)

## 2020-05-09 LAB — LIPID PANEL
Cholesterol: 152 mg/dL (ref <200)
HDL: 63 mg/dL (ref >=40)
LDL Cholesterol (Calc): 75 mg/dL (calc)
Non-HDL Cholesterol (Calc): 89 mg/dL (calc) (ref <130)
Total CHOL/HDL Ratio: 2.4 (calc) (ref <5.0)
Triglycerides: 63 mg/dL (ref <150)

## 2020-05-09 LAB — COMPLETE METABOLIC PANEL WITH GFR
AG Ratio: 1.8 (calc) (ref 1.0–2.5)
ALT: 27 U/L (ref 9–46)
AST: 21 U/L (ref 10–35)
Albumin: 4.4 g/dL (ref 3.6–5.1)
Alkaline phosphatase (APISO): 66 U/L (ref 35–144)
BUN: 16 mg/dL (ref 7–25)
CO2: 25 mmol/L (ref 20–32)
Calcium: 9.2 mg/dL (ref 8.6–10.3)
Chloride: 104 mmol/L (ref 98–110)
Creat: 1.16 mg/dL (ref 0.70–1.33)
GFR, Est African American: 80 mL/min/{1.73_m2} (ref >=60)
GFR, Est Non African American: 69 mL/min/{1.73_m2} (ref >=60)
Globulin: 2.4 g/dL (calc) (ref 1.9–3.7)
Glucose, Bld: 101 mg/dL — ABNORMAL HIGH (ref 65–99)
Potassium: 4.6 mmol/L (ref 3.5–5.3)
Sodium: 139 mmol/L (ref 135–146)
Total Bilirubin: 0.5 mg/dL (ref 0.2–1.2)
Total Protein: 6.8 g/dL (ref 6.1–8.1)

## 2020-05-09 LAB — PSA: PSA: 0.49 ng/mL (ref <=4.0)

## 2020-05-09 LAB — TSH: TSH: 1.22 mIU/L (ref 0.40–4.50)

## 2020-05-09 LAB — VITAMIN D 25 HYDROXY (VIT D DEFICIENCY, FRACTURES): Vit D, 25-Hydroxy: 22 ng/mL — ABNORMAL LOW (ref 30–100)

## 2020-05-27 ENCOUNTER — Other Ambulatory Visit: Payer: Self-pay

## 2020-05-27 ENCOUNTER — Ambulatory Visit (HOSPITAL_COMMUNITY)
Admission: EM | Admit: 2020-05-27 | Discharge: 2020-05-27 | Disposition: A | Discharge status: Home / Self Care | Payer: Medicaid Other

## 2020-05-27 ENCOUNTER — Encounter (HOSPITAL_COMMUNITY): Payer: Self-pay

## 2020-05-27 ENCOUNTER — Ambulatory Visit (INDEPENDENT_AMBULATORY_CARE_PROVIDER_SITE_OTHER): Payer: Medicaid Other

## 2020-05-27 DIAGNOSIS — R2231 Localized swelling, mass and lump, right upper limb: Secondary | ICD-10-CM

## 2020-05-27 DIAGNOSIS — M7989 Other specified soft tissue disorders: Secondary | ICD-10-CM

## 2020-05-27 DIAGNOSIS — W228XXA Striking against or struck by other objects, initial encounter: Secondary | ICD-10-CM

## 2020-05-27 DIAGNOSIS — M79641 Pain in right hand: Secondary | ICD-10-CM

## 2020-05-27 DIAGNOSIS — L02519 Cutaneous abscess of unspecified hand: Secondary | ICD-10-CM

## 2020-05-27 MED ORDER — DOXYCYCLINE HYCLATE 100 MG PO CAPS: 100.0000 mg | ORAL_CAPSULE | Freq: Two times a day (BID) | ORAL | 0 refills | Status: DC

## 2020-05-27 MED ORDER — NAPROXEN 500 MG PO TABS: 500.0000 mg | ORAL_TABLET | Freq: Two times a day (BID) | ORAL | 0 refills | Status: DC | PRN

## 2020-05-27 NOTE — ED Provider Notes (Signed)
[facility]    CSN: [id number] Arrival date & time: 05/27/20  1109      History   Chief Complaint Chief Complaint  Patient presents with  . Hand Injury    HPI Jonathan Carney is a 60 y.o. male.   Jonathan Carney is here with chief complaint of right hand pain and swelling that started on Thursday.  Reports accidentally hitting pole with hand.  Denies any cuts, abrasions, or other injuries. Has used ice at home for swelling but denies any other treatments.  Reports difficulty moving little finger and hand.  Denies any aggravating or alleviating factors.   Denies any fevers, chest pain, shortness of breath, N/V/D, abdominal pain, or headaches.    ROS: As per HPI, all other pertinent ROS negative   The history is provided by the patient.  Hand Injury Associated symptoms: no fever     Past Medical History:  Diagnosis Date  . Borderline high cholesterol   . Hypertension     Patient Active Problem List   Diagnosis Date Noted  . 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 ANTERIOR CERVICAL DECOMP/DISCECTOMY FUSION N/A 04/08/2018   Procedure: Cervical Five-Six, Cervical Six-Seven Anterior cervical decompression/discectomy/fusion;  Surgeon: Tia Alert, MD;  Location: [facility] OR;  Service: Neurosurgery;  Laterality: N/A;  anterior  . EYE SURGERY  to straighten it out   Right       Home Medications    Prior to Admission medications   Medication Sig Start Date End Date Taking? Authorizing Provider  doxycycline (VIBRAMYCIN) 100 MG capsule Take 1 capsule (100 mg total) by mouth 2 (two) times daily for 10 days. 05/27/20 06/06/20 Yes Ivette Loyal, NP  naproxen (NAPROSYN) 500 MG tablet Take 1 tablet (500 mg total) by mouth 2 (two) times daily as needed. 05/27/20  Yes Ivette Loyal, NP  amLODipine (NORVASC) 5 MG tablet Take 5 mg by mouth daily.    [provider]  atorvastatin (LIPITOR) 40 MG tablet Take 40 mg  by mouth at bedtime. 05/06/20   [provider]  methocarbamol (ROBAXIN) 500 MG tablet Take 1 tablet (500 mg total) by mouth every 6 (six) hours as needed for muscle spasms. 04/08/18   Tia Alert, MD  oxyCODONE (OXY IR/ROXICODONE) 5 MG immediate release tablet Take 1 tablet (5 mg total) by mouth every 4 (four) hours as needed for moderate pain ((score 4 to 6)). 04/08/18   Tia Alert, MD    Family History Family History  Problem Relation Age of Onset  . Healthy Mother   . Cancer Father     Social History Social History   Tobacco Use  . Smoking status: Current Every Day Smoker    Packs/day: 0.50    Types: Cigarettes  . Smokeless tobacco: Never Used  Vaping Use  . Vaping Use: Never used  Substance Use Topics  . Alcohol use: No  . Drug use: No     Allergies   Patient has no known allergies.   Review of Systems Review of Systems  Constitutional: Negative for fever.  Skin: Positive for wound.     Physical Exam Triage Vital Signs ED Triage Vitals  Enc Vitals Group     BP 05/27/20 1144 (!) 148/86     Pulse Rate 05/27/20 1140 (!) 101     Resp 05/27/20 1140 20     Temp 05/27/20 1144 99.6 F (37.6 C)  Temp src --      SpO2 05/27/20 1140 100 %     Weight --      Height --      Head Circumference --      Peak Flow --      Pain Score 05/27/20 1139 10     Pain Loc --      Pain Edu? --      Excl. in GC? --    No data found.  Updated Vital Signs BP (!) 148/86   Pulse (!) 101   Temp 99.6 F (37.6 C)   Resp 20   SpO2 100%   Visual Acuity Right Eye Distance:   Left Eye Distance:   Bilateral Distance:    Right Eye Near:   Left Eye Near:    Bilateral Near:     Physical Exam Vitals and nursing note reviewed.  Constitutional:      General: He is not in acute distress.    Appearance: Normal appearance. He is not ill-appearing, toxic-appearing or diaphoretic.  HENT:     Head: Normocephalic and atraumatic.  Eyes:     Conjunctiva/sclera:  Conjunctivae normal.  Cardiovascular:     Rate and Rhythm: Normal rate.     Pulses: Normal pulses.  Pulmonary:     Effort: Pulmonary effort is normal.  Abdominal:     General: Abdomen is flat.  Musculoskeletal:        General: Normal range of motion.     Cervical back: Normal range of motion.  Skin:    General: Skin is warm and dry.     Findings: Ecchymosis, erythema (see photos below ) and signs of injury present.  Neurological:     General: No focal deficit present.     Mental Status: He is alert and oriented to person, place, and time.  Psychiatric:        Mood and Affect: Mood normal.           UC Treatments / Results  Labs (all labs ordered are listed, but only abnormal results are displayed) Labs Reviewed - No data to display  EKG   Radiology DG Hand Complete Right  Result Date: 05/27/2020 CLINICAL DATA:  Hit hand against metal object.  Swelling. EXAM: RIGHT HAND - COMPLETE 3+ VIEW COMPARISON:  Frontal, oblique, and lateral views were obtained. FINDINGS: Frontal, oblique, and lateral views were obtained. There is soft tissue swelling, most marked involving the fifth digit. No fracture or dislocation. There is narrowing of the first MCP joint. Other joint spaces appear unremarkable. No erosive changes. IMPRESSION: Soft tissue swelling, most notably involving the fifth digit. No fracture or dislocation. Osteoarthritic change first MCP joint noted. Electronically Signed   By: Bretta Bang III M.D.   On: 05/27/2020 12:19    Procedures Procedures (including critical care time)  Medications Ordered in UC Medications - No data to display  Initial Impression / Assessment and Plan / UC Course  I have reviewed the triage vital signs and the nursing notes.  Pertinent labs & imaging results that were available during my care of the patient were reviewed by me and considered in my medical decision making (see chart for details).     Hand Swelling Abscess of the  right hand Xray without signs of fracture or effusions 1. Doxycycline BID x 10days and Naproxen PRN for pain.  Recommended Rocephin IM in office but patient declined.  Discussed risk of worsening infection.   2. Warm compresses for pain  and swelling 3. Follow up with [facility] as soon as possible.  If unable to get an appointment, follow up here by Thursday to reassess.    Final Clinical Impressions(s) / UC Diagnoses   Final diagnoses:  Pain of right hand  Swelling of right hand  Abscess, hand     Discharge Instructions     Take the doxycycline twice a day for 10 days.   You can apply a warm compress for pain and swelling.   Call and set up an appointment with [facility] as soon as possible.  If you are unable to get an appointment by Thursday, come back and be re-evaluated here.      ED Prescriptions    Medication Sig Dispense Auth. Provider   doxycycline (VIBRAMYCIN) 100 MG capsule Take 1 capsule (100 mg total) by mouth 2 (two) times daily for 10 days. 20 capsule Ivette Loyal, NP   naproxen (NAPROSYN) 500 MG tablet Take 1 tablet (500 mg total) by mouth 2 (two) times daily as needed. 30 tablet Ivette Loyal, NP     PDMP not reviewed this encounter.   Ivette Loyal, NP 05/27/20 1239

## 2020-05-27 NOTE — ED Triage Notes (Signed)
Pt in with c/o right hand injury that happened on Thursday when he accidentally hit his hand on a metal rail   pt has been icing to help reduce swelling

## 2020-05-27 NOTE — Discharge Instructions (Signed)
Take the doxycycline twice a day for 10 days.   You can apply a warm compress for pain and swelling.   Call and set up an appointment with [facility] as soon as possible.  If you are unable to get an appointment by Thursday, come back and be re-evaluated here.

## 2020-05-29 ENCOUNTER — Inpatient Hospital Stay (HOSPITAL_COMMUNITY)
Admission: EM | Admit: 2020-05-29 | Discharge: 2020-06-01 | DRG: 513 | Disposition: A | Discharge status: Home / Self Care | Payer: Medicaid Other | Attending: Internal Medicine | Admitting: Internal Medicine

## 2020-05-29 ENCOUNTER — Encounter (HOSPITAL_COMMUNITY): Payer: Self-pay | Admitting: *Deleted

## 2020-05-29 ENCOUNTER — Other Ambulatory Visit: Payer: Self-pay

## 2020-05-29 DIAGNOSIS — E669 Obesity, unspecified: Secondary | ICD-10-CM | POA: Diagnosis present

## 2020-05-29 DIAGNOSIS — L03113 Cellulitis of right upper limb: Secondary | ICD-10-CM | POA: Diagnosis present

## 2020-05-29 DIAGNOSIS — D649 Anemia, unspecified: Secondary | ICD-10-CM | POA: Diagnosis present

## 2020-05-29 DIAGNOSIS — Z720 Tobacco use: Secondary | ICD-10-CM

## 2020-05-29 DIAGNOSIS — Z6834 Body mass index (BMI) 34.0-34.9, adult: Secondary | ICD-10-CM

## 2020-05-29 DIAGNOSIS — Z981 Arthrodesis status: Secondary | ICD-10-CM | POA: Diagnosis not present

## 2020-05-29 DIAGNOSIS — L089 Local infection of the skin and subcutaneous tissue, unspecified: Secondary | ICD-10-CM

## 2020-05-29 DIAGNOSIS — L02511 Cutaneous abscess of right hand: Secondary | ICD-10-CM | POA: Diagnosis present

## 2020-05-29 DIAGNOSIS — E559 Vitamin D deficiency, unspecified: Secondary | ICD-10-CM | POA: Diagnosis present

## 2020-05-29 DIAGNOSIS — M65141 Other infective (teno)synovitis, right hand: Principal | ICD-10-CM | POA: Diagnosis present

## 2020-05-29 DIAGNOSIS — Z79899 Other long term (current) drug therapy: Secondary | ICD-10-CM

## 2020-05-29 DIAGNOSIS — L03119 Cellulitis of unspecified part of limb: Secondary | ICD-10-CM | POA: Diagnosis present

## 2020-05-29 DIAGNOSIS — I10 Essential (primary) hypertension: Secondary | ICD-10-CM | POA: Diagnosis present

## 2020-05-29 DIAGNOSIS — F17210 Nicotine dependence, cigarettes, uncomplicated: Secondary | ICD-10-CM | POA: Diagnosis present

## 2020-05-29 DIAGNOSIS — Z20822 Contact with and (suspected) exposure to covid-19: Secondary | ICD-10-CM | POA: Diagnosis present

## 2020-05-29 DIAGNOSIS — E785 Hyperlipidemia, unspecified: Secondary | ICD-10-CM | POA: Diagnosis present

## 2020-05-29 DIAGNOSIS — I96 Gangrene, not elsewhere classified: Secondary | ICD-10-CM | POA: Diagnosis present

## 2020-05-29 HISTORY — DX: Unspecified osteoarthritis, unspecified site: M19.90

## 2020-05-29 LAB — CBC WITH DIFFERENTIAL/PLATELET
Abs Immature Granulocytes: 0.07 10*3/uL (ref 0.00–0.07)
Basophils Absolute: 0 10*3/uL (ref 0.0–0.1)
Basophils Relative: 0 %
Eosinophils Absolute: 0.1 10*3/uL (ref 0.0–0.5)
Eosinophils Relative: 1 %
HCT: 38.8 % — ABNORMAL LOW (ref 39.0–52.0)
Hemoglobin: 12.6 g/dL — ABNORMAL LOW (ref 13.0–17.0)
Immature Granulocytes: 1 %
Lymphocytes Relative: 14 %
Lymphs Abs: 1.6 10*3/uL (ref 0.7–4.0)
MCH: 31 pg (ref 26.0–34.0)
MCHC: 32.5 g/dL (ref 30.0–36.0)
MCV: 95.6 fL (ref 80.0–100.0)
Monocytes Absolute: 1.1 10*3/uL — ABNORMAL HIGH (ref 0.1–1.0)
Monocytes Relative: 10 %
Neutro Abs: 8.7 10*3/uL — ABNORMAL HIGH (ref 1.7–7.7)
Neutrophils Relative %: 74 %
Platelets: 339 10*3/uL (ref 150–400)
RBC: 4.06 MIL/uL — ABNORMAL LOW (ref 4.22–5.81)
RDW: 13.9 % (ref 11.5–15.5)
WBC: 11.5 10*3/uL — ABNORMAL HIGH (ref 4.0–10.5)
nRBC: 0 % (ref 0.0–0.2)

## 2020-05-29 LAB — COMPREHENSIVE METABOLIC PANEL
ALT: 30 U/L (ref 0–44)
AST: 32 U/L (ref 15–41)
Albumin: 3.5 g/dL (ref 3.5–5.0)
Alkaline Phosphatase: 77 U/L (ref 38–126)
Anion gap: 8 (ref 5–15)
BUN: 15 mg/dL (ref 6–20)
CO2: 24 mmol/L (ref 22–32)
Calcium: 8.9 mg/dL (ref 8.9–10.3)
Chloride: 107 mmol/L (ref 98–111)
Creatinine, Ser: 1.05 mg/dL (ref 0.61–1.24)
GFR, Estimated: >60 mL/min (ref >60)
Glucose, Bld: 113 mg/dL — ABNORMAL HIGH (ref 70–99)
Potassium: 3.7 mmol/L (ref 3.5–5.1)
Sodium: 139 mmol/L (ref 135–145)
Total Bilirubin: 0.8 mg/dL (ref 0.3–1.2)
Total Protein: 7.1 g/dL (ref 6.5–8.1)

## 2020-05-29 LAB — LACTIC ACID, PLASMA: Lactic Acid, Venous: 0.9 mmol/L (ref 0.5–1.9)

## 2020-05-29 MED ORDER — VANCOMYCIN HCL 1000 MG/200ML IV SOLN: 1000.0000 mg | Freq: Once | INTRAVENOUS | Status: DC

## 2020-05-29 MED ORDER — MORPHINE SULFATE (PF) 2 MG/ML IV SOLN
2.0000 mg | INTRAVENOUS | Status: DC | PRN
Start: 2020-05-29 — End: 2020-06-01
  Administered 2020-05-30 – 2020-06-01 (×10): 2 mg via INTRAVENOUS
  Filled 2020-05-29 (×10): qty 1

## 2020-05-29 MED ORDER — SODIUM CHLORIDE 0.9 % IV SOLN: INTRAVENOUS | Status: AC

## 2020-05-29 MED ORDER — MORPHINE SULFATE (PF) 4 MG/ML IV SOLN
4.0000 mg | Freq: Once | INTRAVENOUS | Status: AC
  Administered 2020-05-29: 4 mg via INTRAVENOUS
  Filled 2020-05-29: qty 1

## 2020-05-29 MED ORDER — IBUPROFEN 400 MG PO TABS
400.0000 mg | ORAL_TABLET | Freq: Four times a day (QID) | ORAL | Status: DC | PRN
  Administered 2020-05-30 (×2): 400 mg via ORAL
  Filled 2020-05-29 (×2): qty 1

## 2020-05-29 MED ORDER — SODIUM CHLORIDE 0.9 % IV SOLN
2.0000 g | Freq: Three times a day (TID) | INTRAVENOUS | Status: DC
  Administered 2020-05-30 – 2020-06-01 (×9): 2 g via INTRAVENOUS
  Filled 2020-05-29 (×9): qty 2

## 2020-05-29 MED ORDER — VANCOMYCIN HCL 1500 MG/300ML IV SOLN
1500.0000 mg | Freq: Once | INTRAVENOUS | Status: AC
  Administered 2020-05-29: 1500 mg via INTRAVENOUS
  Filled 2020-05-29: qty 300

## 2020-05-29 MED ORDER — HYDRALAZINE HCL 20 MG/ML IJ SOLN: 10.0000 mg | INTRAMUSCULAR | Status: DC | PRN

## 2020-05-29 MED ORDER — SODIUM CHLORIDE 0.9 % IV SOLN
2.0000 g | Freq: Once | INTRAVENOUS | Status: AC
  Administered 2020-05-29: 2 g via INTRAVENOUS
  Filled 2020-05-29: qty 20

## 2020-05-29 MED ORDER — VANCOMYCIN HCL 1000 MG/200ML IV SOLN
1000.0000 mg | Freq: Two times a day (BID) | INTRAVENOUS | Status: DC
  Administered 2020-05-30 – 2020-05-31 (×3): 1000 mg via INTRAVENOUS
  Filled 2020-05-29 (×4): qty 200

## 2020-05-29 NOTE — Progress Notes (Signed)
Pharmacy Antibiotic Note  Jonathan Carney is a 60 y.o. male admitted on 05/29/2020 with Wound infection .  Pharmacy has been consulted for Vancomycin dosing. No weight documented used est weight of 115kg.       Temp (24hrs), Avg:99 F (37.2 C), Min:99 F (37.2 C), Max:99 F (37.2 C)  Recent Labs  Lab 05/29/20 1933  WBC 11.5*  CREATININE 1.05  LATICACIDVEN 0.9    CrCl cannot be calculated (Unknown ideal weight.).    No Known Allergies  Antimicrobials this admission: 3/23 Ceftriaxone >>  3/23 Vancomycin >>   Dose adjustments this admission: N/a  Microbiology results: Pending   Plan:  - Vancomycin 1500mg  IV x 1 dose  - Followed by Vancomycin 1000mg  IV q12h - Est Calc AUC 468  - Monitor patients renal function and urine output  - De-escalate ABX when appropriate   Thank you for allowing pharmacy to be a part of this patient's care.  PharmD. BCPS 05/29/2020 9:46 PM

## 2020-05-29 NOTE — ED Provider Notes (Signed)
[facility] EMERGENCY DEPARTMENT Provider Note   CSN: [id number] Arrival date & time: 05/29/20  1825     History Chief Complaint  Patient presents with  . Hand Pain    Jonathan Carney is a 60 y.o. male with a past medical history of hypertension presenting to the ED with a chief complaint of right fifth digit swelling.  On 05/25/2020, was at home when he accidentally hit his finger on a railing.  He has had progressive worsening swelling and pain in the area.  He was seen and evaluated at urgent care 2 days ago and was placed on doxycycline.  He followed up with hand specialist at [facility] today and was told to come to the ER for admission and IV antibiotics. He has been compliant with his doxycycline.  He has been taking pain medication with some improvement. Denies any subsequent injury or trauma to the area. Reports chills but denies any fevers, weakness.  Does report some intermittent paresthesias to the tip of his right fifth digit.  HPI     Past Medical History:  Diagnosis Date  . Borderline high cholesterol   . Hypertension     Patient Active Problem List   Diagnosis Date Noted  . 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 ANTERIOR CERVICAL DECOMP/DISCECTOMY FUSION N/A 04/08/2018   Procedure: Cervical Five-Six, Cervical Six-Seven Anterior cervical decompression/discectomy/fusion;  Surgeon: Tia Alert, MD;  Location: [facility] OR;  Service: Neurosurgery;  Laterality: N/A;  anterior  . EYE SURGERY  to straighten it out   Right       Family History  Problem Relation Age of Onset  . Healthy Mother   . Cancer Father     Social History   Tobacco Use  . Smoking status: Current Every Day Smoker    Packs/day: 0.50    Types: Cigarettes  . Smokeless tobacco: Never Used  Vaping Use  . Vaping Use: Never used  Substance Use Topics  . Alcohol use: No  . Drug use: No    Home Medications Prior to  Admission medications   Medication Sig Start Date End Date Taking? Authorizing Provider  amLODipine (NORVASC) 5 MG tablet Take 5 mg by mouth daily.    [provider]  atorvastatin (LIPITOR) 40 MG tablet Take 40 mg by mouth at bedtime. 05/06/20   [provider]  doxycycline (VIBRAMYCIN) 100 MG capsule Take 1 capsule (100 mg total) by mouth 2 (two) times daily for 10 days. 05/27/20 06/06/20  Ivette Loyal, NP  methocarbamol (ROBAXIN) 500 MG tablet Take 1 tablet (500 mg total) by mouth every 6 (six) hours as needed for muscle spasms. 04/08/18   Tia Alert, MD  naproxen (NAPROSYN) 500 MG tablet Take 1 tablet (500 mg total) by mouth 2 (two) times daily as needed. 05/27/20   Ivette Loyal, NP  oxyCODONE (OXY IR/ROXICODONE) 5 MG immediate release tablet Take 1 tablet (5 mg total) by mouth every 4 (four) hours as needed for moderate pain ((score 4 to 6)). 04/08/18   Tia Alert, MD    Allergies    Patient has no known allergies.  Review of Systems   Review of Systems  Constitutional: Positive for chills. Negative for appetite change and fever.  HENT: Negative for ear pain, rhinorrhea, sneezing and sore throat.   Eyes: Negative for photophobia and visual disturbance.  Respiratory: Negative for cough, chest tightness, shortness of breath and  wheezing.   Cardiovascular: Negative for chest pain and palpitations.  Gastrointestinal: Negative for abdominal pain, blood in stool, constipation, diarrhea, nausea and vomiting.  Genitourinary: Negative for dysuria, hematuria and urgency.  Musculoskeletal: Positive for joint swelling. Negative for myalgias.  Skin: Positive for wound. Negative for rash.  Neurological: Negative for dizziness, weakness and light-headedness.    Physical Exam Updated Vital Signs BP (!) 151/75 (BP Location: Left Arm)   Pulse 98   Temp 99 F (37.2 C) (Oral)   Resp 18   SpO2 95%   Physical Exam Vitals and nursing note reviewed.  Constitutional:       General: He is not in acute distress.    Appearance: He is well-developed.  HENT:     Head: Normocephalic and atraumatic.     Nose: Nose normal.  Eyes:     General: No scleral icterus.       Left eye: No discharge.     Conjunctiva/sclera: Conjunctivae normal.  Cardiovascular:     Rate and Rhythm: Normal rate and regular rhythm.     Heart sounds: Normal heart sounds. No murmur heard. No friction rub. No gallop.   Pulmonary:     Effort: Pulmonary effort is normal. No respiratory distress.     Breath sounds: Normal breath sounds.  Abdominal:     General: Bowel sounds are normal. There is no distension.     Palpations: Abdomen is soft.     Tenderness: There is no abdominal tenderness. There is no guarding.  Musculoskeletal:        General: Swelling and tenderness present. Normal range of motion.     Cervical back: Normal range of motion and neck supple.     Comments: Diffuse swelling of the right fifth digit seen in the image.  Pain with ROM of the finger.  Reports sensation is intact to light touch.  No open or draining wounds noted.  Skin:    General: Skin is warm and dry.     Findings: No rash.  Neurological:     Mental Status: He is alert.     Motor: No abnormal muscle tone.     Coordination: Coordination normal.         ED Results / Procedures / Treatments   Labs (all labs ordered are listed, but only abnormal results are displayed) Labs Reviewed  COMPREHENSIVE METABOLIC PANEL - Abnormal; Notable for the following components:      Result Value   Glucose, Bld 113 (*)    All other components within normal limits  CBC WITH DIFFERENTIAL/PLATELET - Abnormal; Notable for the following components:   WBC 11.5 (*)    RBC 4.06 (*)    Hemoglobin 12.6 (*)    HCT 38.8 (*)    Neutro Abs 8.7 (*)    Monocytes Absolute 1.1 (*)    All other components within normal limits  RESP PANEL BY RT-PCR (FLU A&B, COVID) ARPGX2  LACTIC ACID, PLASMA    EKG None  Radiology No results  found.  Procedures Procedures   Medications Ordered in ED Medications  vancomycin (VANCOREADY) IVPB 1500 mg/300 mL (1,500 mg Intravenous New Bag/Given 05/29/20 2154)  vancomycin (VANCOREADY) IVPB 1000 mg/200 mL (has no administration in time range)  cefTRIAXone (ROCEPHIN) 2 g in sodium chloride 0.9 % 100 mL IVPB (0 g Intravenous Stopped 05/29/20 2154)  morphine 4 MG/ML injection 4 mg (4 mg Intravenous Given 05/29/20 2135)    ED Course  I have reviewed the triage vital signs and  the nursing notes.  Pertinent labs & imaging results that were available during my care of the patient were reviewed by me and considered in my medical decision making (see chart for details).  Clinical Course as of 05/29/20 2155  Wed May 29, 2020  2030 Patient states that he is unwilling to stay overnight in the hospital tonight because he has to be home to take care of his elderly mother. [HK]  2031 Pharmacist asked that we switch to ceftriaxone instead of Ancef. [HK]  2032 I spoke to Dr. Victorino Dike, on-call for [facility].  He was unaware of this patient and recommends if he is not willing to stay, should give IV antibiotics and discharged home with p.o. antibiotics.  He will try to pass along the message to the morning team. [HK]  2120 Repaged Dr. Victorino Dike.  Informed him that patient is willing to stay in the ER and that hand surgeon that he saw in clinic should be able to follow up with him tomorrow.  Because there is another provider from another practice on-call for hand surgery, we agreed that seeing the hand surgeon that he is already established with is best. [HK]    Clinical Course User Index [HK] Dietrich Pates, PA-C   MDM Rules/Calculators/A&P                          60 year old male with past medical history of hypertension presenting to the ED with a chief complaint of right fifth digit swelling.  Believes that he injured this finger about 4 days ago when he was at home.  He has had progressive worsening  swelling and pain in the area.  Given doxycycline after being evaluated at urgent care 2 days ago.  He saw a hand specialist at [facility] today and was told to come to the ER for IV antibiotics.  He reports compliance with his doxycycline.  X-ray at that time showed no bony abnormalities but did show diffuse soft tissue swelling.  Denies any fevers.  Does report some paresthesias to the tip of his right fifth digit.  On exam there is diffuse swelling and underlying infection noted of the right fifth digit. Edema extends to hand.  He has limited range of motion secondary to swelling and pain.  Pulses are intact bilaterally.  He is afebrile here.  Lab work significant for a leukocytosis of 11.5.  CMP unremarkable.  Lactic acid level is normal.  He was started on vancomycin and Rocephin per pharmacy consult.  After speaking to family members, patient decides that he is willing to stay for additional treatment.  I am unsure the hand surgeon that he saw today in the office but I have informed Dr. Victorino Dike who is on-call for [facility] that he needs to be followed by the hand surgeon for the practice.   Portions of this note were generated with Scientist, clinical (histocompatibility and immunogenetics). Dictation errors may occur despite best attempts at proofreading.  Final Clinical Impression(s) / ED Diagnoses Final diagnoses:  Finger infection    Rx / DC Orders ED Discharge Orders    None       Dietrich Pates, PA-C 05/29/20 2155    Jacalyn Lefevre, MD 06/01/20 [phone]

## 2020-05-29 NOTE — H&P (Signed)
History and Physical    Jonathan Carney VCB:[id number] DOB: 1961-03-07 DOA: 05/29/2020  PCP: Fleet Contras, MD  Patient coming from: Home.  Chief Complaint: Right hand swelling.  HPI: Jonathan Carney is a 60 y.o. male with history of hypertension, hyperlipidemia and tobacco abuse presents to the ER because of worsening swelling of the right hand.  Patient had hurt his hand on the railing at his home when he was with his dog.  The hand particularly the little finger on the right hand started swelling which progressed and on 27 May 2020 went to urgent care and was given antibiotics however x-rays did not show any fracture.  Did show some soft tissue swelling and also was advised to follow-up with hand surgeon for which patient went to [facility].  Over there patient was instructed to come to the ER for IV antibiotics.  Denies any fever chills.  ED Course: On exam in the ER patient has not able to make complete fist and there is swelling of the right hand little finger and also on the medial aspect of his palm.  Able to flex his wrist.  Labs show mild leukocytosis of 11.5 Covid test is negative.  ER physician discussed with on-call orthopedic surgeon for [facility].  Patient started on antibiotics and admitted for further management.  Review of Systems: As per HPI, rest all negative.   Past Medical History:  Diagnosis Date  . Arthritis   . Borderline high cholesterol   . Hypertension     Past Surgical History:  Procedure Laterality Date  . ANTERIOR CERVICAL DECOMP/DISCECTOMY FUSION N/A 04/08/2018   Procedure: Cervical Five-Six, Cervical Six-Seven Anterior cervical decompression/discectomy/fusion;  Surgeon: Tia Alert, MD;  Location: [facility] OR;  Service: Neurosurgery;  Laterality: N/A;  anterior  . EYE SURGERY  to straighten it out   Right     reports that he has been smoking cigarettes. He has been smoking about 0.50 packs per day. He has never used smokeless tobacco. He reports that he  does not drink alcohol and does not use drugs.  No Known Allergies  Family History  Problem Relation Age of Onset  . Healthy Mother   . Cancer Father     Prior to Admission medications   Medication Sig Start Date End Date Taking? Authorizing Provider  amLODipine (NORVASC) 5 MG tablet Take 5 mg by mouth daily.   Yes [provider]  atorvastatin (LIPITOR) 40 MG tablet Take 40 mg by mouth daily. 05/06/20  Yes [provider]  doxycycline (VIBRAMYCIN) 100 MG capsule Take 1 capsule (100 mg total) by mouth 2 (two) times daily for 10 days. 05/27/20 06/06/20 Yes Ivette Loyal, NP  methocarbamol (ROBAXIN) 500 MG tablet Take 1 tablet (500 mg total) by mouth every 6 (six) hours as needed for muscle spasms. 04/08/18  Yes Tia Alert, MD  oxyCODONE (OXY IR/ROXICODONE) 5 MG immediate release tablet Take 1 tablet (5 mg total) by mouth every 4 (four) hours as needed for moderate pain ((score 4 to 6)). 04/08/18  Yes Tia Alert, MD    Physical Exam: Constitutional: Moderately built and nourished. Vitals:   05/29/20 1911 05/29/20 1912  BP: (!) 151/75   Pulse: 98   Resp: 18   Temp:  99 F (37.2 C)  TempSrc:  Oral  SpO2: 95%    Eyes: Anicteric no pallor. ENMT: No discharge from the ears eyes nose or mouth. Neck: No mass felt.  No neck rigidity. Respiratory: No rhonchi or  crepitations. Cardiovascular: S1-S2 heard. Abdomen: Soft nontender bowel sounds present. Musculoskeletal: Swelling of the right hand particularly the little finger of the right hand and also medial aspect of the right palm.  Unable to make complete fist.  Able to move his right wrist without difficulty. Skin: Erythema and swelling of the right hand little finger and medial aspect. Neurologic: Alert awake oriented to time place and person.  Moves all extremities. Psychiatric: Appears normal.  Normal affect.   Labs on Admission: I have personally reviewed following labs and imaging studies  CBC: Recent  Labs  Lab 05/29/20 1933  WBC 11.5*  NEUTROABS 8.7*  HGB 12.6*  HCT 38.8*  MCV 95.6  PLT 339   Basic Metabolic Panel: Recent Labs  Lab 05/29/20 1933  NA 139  K 3.7  CL 107  CO2 24  GLUCOSE 113*  BUN 15  CREATININE 1.05  CALCIUM 8.9   GFR: CrCl cannot be calculated (Unknown ideal weight.). Liver Function Tests: Recent Labs  Lab 05/29/20 1933  AST 32  ALT 30  ALKPHOS 77  BILITOT 0.8  PROT 7.1  ALBUMIN 3.5   No results for input(s): LIPASE, AMYLASE in the last 168 hours. No results for input(s): AMMONIA in the last 168 hours. Coagulation Profile: No results for input(s): INR, PROTIME in the last 168 hours. Cardiac Enzymes: No results for input(s): CKTOTAL, CKMB, CKMBINDEX, TROPONINI in the last 168 hours. BNP (last 3 results) No results for input(s): PROBNP in the last 8760 hours. HbA1C: No results for input(s): HGBA1C in the last 72 hours. CBG: No results for input(s): GLUCAP in the last 168 hours. Lipid Profile: No results for input(s): CHOL, HDL, LDLCALC, TRIG, CHOLHDL, LDLDIRECT in the last 72 hours. Thyroid Function Tests: No results for input(s): TSH, T4TOTAL, FREET4, T3FREE, THYROIDAB in the last 72 hours. Anemia Panel: No results for input(s): VITAMINB12, FOLATE, FERRITIN, TIBC, IRON, RETICCTPCT in the last 72 hours. Urine analysis: No results found for: COLORURINE, APPEARANCEUR, LABSPEC, PHURINE, GLUCOSEU, HGBUR, BILIRUBINUR, KETONESUR, PROTEINUR, UROBILINOGEN, NITRITE, LEUKOCYTESUR Sepsis Labs: @LABRCNTIP (procalcitonin:4,lacticidven:4) )No results found for this or any previous visit (from the past 240 hour(s)).   Radiological Exams on Admission: No results found.    Assessment/Plan Active Problems:   Cellulitis of hand    1. Right hand cellulitis involving the right hand little finger and the medial aspect of his right hand palm -we will keep patient on IV antibiotics and n.p.o anticipation of possible surgery.  Consult [facility] hand  surgeon since patient has been already consulted by them in the office.  Will check MRI. 2. Hypertension we will keep patient on as needed IV hydralazine since patient is n.p.o. 3. History of hyperlipidemia on statins. 4. Tobacco abuse advised about quitting. 5. Anemia appears to be new follow CBC.  Check anemia panel with next blood draw.  Since patient has significant swelling of the right hand with cellulitis will need close monitoring pain patient may need surgery in inpatient status.   DVT prophylaxis: SCDs.  Avoiding anticoagulation in anticipation of surgery. Code Status: Full code. Family Communication: Discussed with patient. Disposition Plan: Home. Consults called: ER physician discussed with on-call orthopedic surgeon for [facility].  We need to consult with hand surgeon of [facility]. Admission status: Inpatient.   MD [facility] Pager [phone].  If 7PM-7AM, please contact night-coverage [email or web address] Password [facility]  05/29/2020, 10:26 PM

## 2020-05-29 NOTE — ED Triage Notes (Signed)
Pt was swinging a switch on Saturday and noticed swelling to L hand on Monday. Has been seen at [facility] then a hand specialist today, referred to ER for IV antibiotics. Swelling noted to R little finger and R hand

## 2020-05-30 ENCOUNTER — Encounter (HOSPITAL_COMMUNITY)
Admission: EM | Disposition: A | Discharge status: Home / Self Care | Payer: Self-pay | Source: Home / Self Care | Attending: Internal Medicine

## 2020-05-30 ENCOUNTER — Inpatient Hospital Stay (HOSPITAL_COMMUNITY): Payer: Medicaid Other | Admitting: Anesthesiology

## 2020-05-30 ENCOUNTER — Encounter (HOSPITAL_COMMUNITY): Payer: Self-pay | Admitting: Internal Medicine

## 2020-05-30 ENCOUNTER — Inpatient Hospital Stay (HOSPITAL_COMMUNITY): Payer: Medicaid Other

## 2020-05-30 DIAGNOSIS — L03119 Cellulitis of unspecified part of limb: Secondary | ICD-10-CM

## 2020-05-30 HISTORY — PX: I & D EXTREMITY: SHX5045

## 2020-05-30 LAB — SURGICAL PCR SCREEN
MRSA, PCR: NEGATIVE
Staphylococcus aureus: NEGATIVE

## 2020-05-30 LAB — SARS CORONAVIRUS 2 (TAT 6-24 HRS): SARS Coronavirus 2: NEGATIVE

## 2020-05-30 LAB — HIV ANTIBODY (ROUTINE TESTING W REFLEX): HIV Screen 4th Generation wRfx: NONREACTIVE

## 2020-05-30 SURGERY — IRRIGATION AND DEBRIDEMENT EXTREMITY
Anesthesia: General | Site: Finger | Laterality: Right

## 2020-05-30 MED ORDER — PROPOFOL 10 MG/ML IV BOLUS
INTRAVENOUS | Status: AC
  Filled 2020-05-30: qty 20

## 2020-05-30 MED ORDER — HYDROMORPHONE HCL 1 MG/ML IJ SOLN
0.2500 mg | INTRAMUSCULAR | Status: DC | PRN
  Administered 2020-05-30 (×4): 0.5 mg via INTRAVENOUS

## 2020-05-30 MED ORDER — MEPERIDINE HCL 25 MG/ML IJ SOLN: 6.2500 mg | INTRAMUSCULAR | Status: DC | PRN

## 2020-05-30 MED ORDER — DEXAMETHASONE SODIUM PHOSPHATE 4 MG/ML IJ SOLN
INTRAMUSCULAR | Status: DC | PRN
  Administered 2020-05-30: 10 mg via INTRAVENOUS

## 2020-05-30 MED ORDER — LIDOCAINE 2% (20 MG/ML) 5 ML SYRINGE
INTRAMUSCULAR | Status: DC | PRN
  Administered 2020-05-30: 100 mg via INTRAVENOUS

## 2020-05-30 MED ORDER — PHENYLEPHRINE 40 MCG/ML (10ML) SYRINGE FOR IV PUSH (FOR BLOOD PRESSURE SUPPORT)
PREFILLED_SYRINGE | INTRAVENOUS | Status: AC
  Filled 2020-05-30: qty 10

## 2020-05-30 MED ORDER — PROMETHAZINE HCL 25 MG/ML IJ SOLN: 6.2500 mg | INTRAMUSCULAR | Status: DC | PRN

## 2020-05-30 MED ORDER — FENTANYL CITRATE (PF) 250 MCG/5ML IJ SOLN
INTRAMUSCULAR | Status: AC
  Filled 2020-05-30: qty 5

## 2020-05-30 MED ORDER — BUPIVACAINE HCL (PF) 0.25 % IJ SOLN
INTRAMUSCULAR | Status: AC
  Filled 2020-05-30: qty 30

## 2020-05-30 MED ORDER — PHENYLEPHRINE 40 MCG/ML (10ML) SYRINGE FOR IV PUSH (FOR BLOOD PRESSURE SUPPORT)
PREFILLED_SYRINGE | INTRAVENOUS | Status: DC | PRN
  Administered 2020-05-30 (×3): 80 ug via INTRAVENOUS

## 2020-05-30 MED ORDER — VITAMIN D (ERGOCALCIFEROL) 1.25 MG (50000 UNIT) PO CAPS
50000.0000 [IU] | ORAL_CAPSULE | ORAL | Status: DC
  Administered 2020-05-30: 50000 [IU] via ORAL
  Filled 2020-05-30: qty 1

## 2020-05-30 MED ORDER — MIDAZOLAM HCL 2 MG/2ML IJ SOLN
INTRAMUSCULAR | Status: AC
  Filled 2020-05-30: qty 2

## 2020-05-30 MED ORDER — LIDOCAINE 2% (20 MG/ML) 5 ML SYRINGE
INTRAMUSCULAR | Status: AC
  Filled 2020-05-30: qty 5

## 2020-05-30 MED ORDER — FENTANYL CITRATE (PF) 100 MCG/2ML IJ SOLN
INTRAMUSCULAR | Status: DC | PRN
  Administered 2020-05-30 (×2): 25 ug via INTRAVENOUS
  Administered 2020-05-30: 50 ug via INTRAVENOUS
  Administered 2020-05-30: 100 ug via INTRAVENOUS
  Administered 2020-05-30 (×2): 25 ug via INTRAVENOUS

## 2020-05-30 MED ORDER — HYDROMORPHONE HCL 1 MG/ML IJ SOLN
INTRAMUSCULAR | Status: AC
  Filled 2020-05-30: qty 1

## 2020-05-30 MED ORDER — SODIUM CHLORIDE 0.9 % IR SOLN
Status: DC | PRN
  Administered 2020-05-30: 3000 mL

## 2020-05-30 MED ORDER — MIDAZOLAM HCL 5 MG/5ML IJ SOLN
INTRAMUSCULAR | Status: DC | PRN
  Administered 2020-05-30: 2 mg via INTRAVENOUS

## 2020-05-30 MED ORDER — 0.9 % SODIUM CHLORIDE (POUR BTL) OPTIME
TOPICAL | Status: DC | PRN
  Administered 2020-05-30: 1000 mL

## 2020-05-30 MED ORDER — LACTATED RINGERS IV SOLN: INTRAVENOUS | Status: DC

## 2020-05-30 MED ORDER — PROPOFOL 10 MG/ML IV BOLUS
INTRAVENOUS | Status: DC | PRN
  Administered 2020-05-30: 200 mg via INTRAVENOUS

## 2020-05-30 MED ORDER — CHLORHEXIDINE GLUCONATE 0.12 % MT SOLN: 15.0000 mL | Freq: Once | OROMUCOSAL | Status: DC

## 2020-05-30 MED ORDER — ONDANSETRON HCL 4 MG/2ML IJ SOLN
INTRAMUSCULAR | Status: DC | PRN
  Administered 2020-05-30: 4 mg via INTRAVENOUS

## 2020-05-30 SURGICAL SUPPLY — 49 items
APL PRP STRL LF DISP 70% ISPRP (MISCELLANEOUS)
BNDG CMPR 9X4 STRL LF SNTH (GAUZE/BANDAGES/DRESSINGS) ×1
BNDG ELASTIC 4X5.8 VLCR STR LF (GAUZE/BANDAGES/DRESSINGS) ×2 IMPLANT
BNDG ESMARK 4X9 LF (GAUZE/BANDAGES/DRESSINGS) ×1 IMPLANT
BNDG GAUZE ELAST 4 BULKY (GAUZE/BANDAGES/DRESSINGS) ×4 IMPLANT
CHLORAPREP W/TINT 26 (MISCELLANEOUS) ×1 IMPLANT
CORD BIPOLAR FORCEPS 12FT (ELECTRODE) ×2 IMPLANT
COVER SURGICAL LIGHT HANDLE (MISCELLANEOUS) ×2 IMPLANT
COVER WAND RF STERILE (DRAPES) ×2 IMPLANT
CUFF TOURN SGL QUICK 18X4 (TOURNIQUET CUFF) ×2 IMPLANT
CUFF TOURN SGL QUICK 24 (TOURNIQUET CUFF)
CUFF TRNQT CYL 24X4X16.5-23 (TOURNIQUET CUFF) IMPLANT
DRAIN PENROSE 1/4X12 LTX STRL (WOUND CARE) ×1 IMPLANT
DRAPE SURG 17X23 STRL (DRAPES) ×1 IMPLANT
DRAPE U-SHAPE 47X51 STRL (DRAPES) ×1 IMPLANT
GAUZE SPONGE 4X4 12PLY STRL (GAUZE/BANDAGES/DRESSINGS) ×1 IMPLANT
GAUZE SPONGE 4X4 12PLY STRL LF (GAUZE/BANDAGES/DRESSINGS) ×1 IMPLANT
GAUZE XEROFORM 5X9 LF (GAUZE/BANDAGES/DRESSINGS) ×2 IMPLANT
GLOVE SRG 8 PF TXTR STRL LF DI (GLOVE) ×1 IMPLANT
GLOVE SURG SYN 7.5  E (GLOVE) ×2
GLOVE SURG SYN 7.5 E (GLOVE) ×1 IMPLANT
GLOVE SURG SYN 7.5 PF PI (GLOVE) ×1 IMPLANT
GLOVE SURG UNDER POLY LF SZ8 (GLOVE) ×2
GOWN STRL REUS W/ TWL LRG LVL3 (GOWN DISPOSABLE) ×1 IMPLANT
GOWN STRL REUS W/TWL LRG LVL3 (GOWN DISPOSABLE) ×4
KIT BASIN OR (CUSTOM PROCEDURE TRAY) ×2 IMPLANT
KIT TURNOVER KIT B (KITS) ×2 IMPLANT
MANIFOLD NEPTUNE II (INSTRUMENTS) ×2 IMPLANT
NDL HYPO 25GX1X1/2 BEV (NEEDLE) IMPLANT
NEEDLE HYPO 25GX1X1/2 BEV (NEEDLE) ×2 IMPLANT
NS IRRIG 1000ML POUR BTL (IV SOLUTION) ×2 IMPLANT
PACK ORTHO EXTREMITY (CUSTOM PROCEDURE TRAY) ×2 IMPLANT
PAD ARMBOARD 7.5X6 YLW CONV (MISCELLANEOUS) ×3 IMPLANT
PAD CAST 4YDX4 CTTN HI CHSV (CAST SUPPLIES) ×1 IMPLANT
PADDING CAST COTTON 4X4 STRL (CAST SUPPLIES)
SET CYSTO W/LG BORE CLAMP LF (SET/KITS/TRAYS/PACK) ×2 IMPLANT
SPONGE LAP 4X18 RFD (DISPOSABLE) ×1 IMPLANT
SUCTION FRAZIER TIP 8 FR DISP (SUCTIONS) ×2
SUCTION TUBE FRAZIER 8FR DISP (SUCTIONS) IMPLANT
SUT PROLENE 4 0 PS 2 18 (SUTURE) ×1 IMPLANT
SWAB CULTURE ESWAB REG 1ML (MISCELLANEOUS) ×2 IMPLANT
SYR CONTROL 10ML LL (SYRINGE) ×1 IMPLANT
TOWEL GREEN STERILE (TOWEL DISPOSABLE) ×2 IMPLANT
TOWEL GREEN STERILE FF (TOWEL DISPOSABLE) ×2 IMPLANT
TUBE CONNECTING 12X1/4 (SUCTIONS) ×2 IMPLANT
TUBING TUR DISP (UROLOGICAL SUPPLIES) IMPLANT
UNDERPAD 30X36 HEAVY ABSORB (UNDERPADS AND DIAPERS) ×4 IMPLANT
WATER STERILE IRR 1000ML POUR (IV SOLUTION) ×2 IMPLANT
YANKAUER SUCT BULB TIP NO VENT (SUCTIONS) ×1 IMPLANT

## 2020-05-30 NOTE — Op Note (Signed)
PREOPERATIVE DIAGNOSIS: Severe right small finger infection with flexor tenosynovitis  POSTOPERATIVE DIAGNOSIS: Same  ATTENDING PHYSICIAN: Gasper Lloyd. Roney Mans, III, MD who was present and scrubbed for the entire case   ASSISTANT SURGEON: None.   ANESTHESIA: General  SURGICAL PROCEDURES: 1.  Irrigation and debridement of right small finger including the flexor tendon sheath for flexor tenosynovitis 2.  Irrigation debridement of dorsal right small finger abscess 3.  Flexor tenosynovectomy of the small finger FDS and FDP in the digit 4.  Right small finger ulnar digital nerve neurolysis  SURGICAL INDICATIONS: Patient is a 60 year old male with a right small finger infection.  Initially presented to [facility] urgent care on Monday where he was found to have a right small finger infection.  He was placed on oral antibiotics but had continued worsening symptoms.  He then presented to my clinic yesterday where he was found to have significantly progressive infection to his right small finger with concerning features for infectious pyogenic flexor tenosynovitis.  In clinic he was encouraged to go directly from the clinic to the ER but he initially refused this.  Later in the evening he did finally present to the ER last night where he was admitted to the hospital service and started on antibiotics.  Despite being on IV antibiotics he still had progressive worsening in his wound appearance including more redness and swelling thus I did recommend proceeding forward with irrigation debridement of his right small finger.  FINDING: See operative note  DESCRIPTION OF PROCEDURE: Patient was identified in the preop holding area where the risk benefits and alternatives of the procedure were discussed with the patient.  These risks include but are not limited to infection, bleeding, damage to surrounding structures including blood vessels and nerves, pain, stiffness, need for additional procedures and loss of  digit.  After having this discussion informed consent was obtained the patient's right small finger was marked with surgical marking pen.  He was then brought back to the operative suite where timeout was performed identifying the correct patient operative site.  He was positioned supine on the operative table with his hand outstretched on a hand table.  He was induced under general anesthesia.  All bony prominences were well-padded.  A tourniquet was placed on the upper arm.  The right upper extremity was prepped and draped with a Betadine paint and scrub and draped in usual sterile fashion.  The limb was exsanguinated and the tourniquet was inflated.  There was superficial abscess circumferentially around the small finger both palmarly and dorsally.  An incision was made over the ulnar mid axial line and extended palmar over the A1 pulley to the small finger.  In making the skin incision there was immediate blush of purulent fluid which was cultured for both aerobic and anaerobic bacteria.  There was superficial sloughing of the skin with abscess between the epidermal and dermal layers.  This resulted in removal of good portion of superficial skin layer throughout both the palmar and dorsal aspects of the finger up to the DIP joint.  The incision was continued deep with the mid axial line along the ulnar aspect of the finger elevating a radially based, volar skin flap.  In elevating the skin there was also abundance of purulent fluid in the palmar aspect of the finger.  This fluid was also cultured for both aerobic and anaerobic bacteria.  The ulnar digital nerve neurovascular bundle was identified and mobilized through the infectious and purulent material.  It was protected  with retractors through the procedure.  The purulence extended palmar and surrounded the flexor tendons.  There was damage to the pulley system due to the surrounding infection.  Debridement was then performed sharply with scissors  removing all necrotic and infectious material along the palmar aspect of the finger.  This dissection did continue proximally to the level of the A1 pulley in the palm.  Thorough debridement around both the FDS and FDP tendons was performed in the finger breeding the tenosynovium around both the FDS and FDP tendons.  The abscess did extend deep as well in between the fourth and fifth digits.  A dorsal incision was then made to allow for further blunt dissection dorsally and debridement dorsally.  There was some further purulent material seen over the dorsal aspect of the small finger MP joint.  This did extend distally over the proximal phalanx as well.  Once thorough debridement had been performed sharply with scissors as well as a rondure, the wound was copiously irrigated with 3 L of normal saline via cystoscopy tubing.  A Penrose drain was placed into the wound both palmarly and dorsally and the wounds were loosely closed with 4-0 Prolene sutures.  Xeroform, 4 x 4's and a well-padded bulky soft dressing was placed.  The tourniquet was released and the patient had return of brisk capillary refill to the small finger.  He was awoken from his anesthesia and taken the PACU in stable condition.  He tolerated the procedure well and there were no complications.  TOURNIQUET TIME: 38 minutes  SPECIMENS: Aerobic and anaerobic cultures x2, superficial and deep abscesses  POSTOPERATIVE PLAN: Patient will be transferred back to the floor for continued care under the hospitalist service.  We will continue to follow his cultures to tailor his antibiotics as needed.  PT wound care has also been consulted to continue with dressing changes and wound care with hydrotherapy on the floor.  We will monitor his wounds going forward to determine the appropriate time for treatment and will have close follow-up with him in clinic.  IMPLANTS: None

## 2020-05-30 NOTE — H&P (Signed)
ORTHOPAEDIC H&P  PCP:  Fleet Contras, MD  Chief Complaint: Right hand infection  HPI: Jonathan Carney is a 60 y.o. male who complains of right hand infection.  Patient states that over the weekend he struck the right small finger on a metal bar.  He subsequently developed increased swelling and erythema to the right small finger.  He presented to [facility] urgent care where there was concern for an infection to his small finger.  He was placed on doxycycline at the time but hand surgery was not called or consulted.  He subsequently presented to my clinic yesterday where he had progressive increased swelling, redness and pain to his right small finger.  He had worsening significantly of the infection to the small digit.  He was instructed to come to the ER yesterday but initially refused this.  Later in the day he did reconsider and presented to the ER for admission to the hospitalist service last night.  He was placed on IV antibiotics.  He still notes continued pain and swelling to the right small finger which has failed to significantly improve since being on IV antibiotics.  He has remained n.p.o.  Past Medical History:  Diagnosis Date  . Arthritis   . Borderline high cholesterol   . Hypertension    Past Surgical History:  Procedure Laterality Date  . ANTERIOR CERVICAL DECOMP/DISCECTOMY FUSION N/A 04/08/2018   Procedure: Cervical Five-Six, Cervical Six-Seven Anterior cervical decompression/discectomy/fusion;  Surgeon: Tia Alert, MD;  Location: [facility] OR;  Service: Neurosurgery;  Laterality: N/A;  anterior  . EYE SURGERY  to straighten it out   Right   Social History   Socioeconomic History  . Marital status: Single    Spouse name: Not on file  . Number of children: Not on file  . Years of education: Not on file  . Highest education level: Not on file  Occupational History  . Not on file  Tobacco Use  . Smoking status: Current Every Day Smoker    Packs/day: 0.50    Types:  Cigarettes  . Smokeless tobacco: Never Used  Vaping Use  . Vaping Use: Never used  Substance and Sexual Activity  . Alcohol use: No  . Drug use: No  . Sexual activity: Yes    Birth control/protection: None  Other Topics Concern  . Not on file  Social History Narrative  . Not on file   Social Determinants of Health   Financial Resource Strain: Not on file  Food Insecurity: Not on file  Transportation Needs: Not on file  Physical Activity: Not on file  Stress: Not on file  Social Connections: Not on file   Family History  Problem Relation Age of Onset  . Healthy Mother   . Cancer Father    No Known Allergies Prior to Admission medications   Medication Sig Start Date End Date Taking? Authorizing Provider  amLODipine (NORVASC) 5 MG tablet Take 5 mg by mouth daily.   Yes [provider]  atorvastatin (LIPITOR) 40 MG tablet Take 40 mg by mouth daily. 05/06/20  Yes [provider]  doxycycline (VIBRAMYCIN) 100 MG capsule Take 1 capsule (100 mg total) by mouth 2 (two) times daily for 10 days. 05/27/20 06/06/20 Yes Ivette Loyal, NP  methocarbamol (ROBAXIN) 500 MG tablet Take 1 tablet (500 mg total) by mouth every 6 (six) hours as needed for muscle spasms. 04/08/18  Yes Tia Alert, MD  oxyCODONE (OXY IR/ROXICODONE) 5 MG immediate release tablet  Take 1 tablet (5 mg total) by mouth every 4 (four) hours as needed for moderate pain ((score 4 to 6)). 04/08/18  Yes Tia Alert, MD   MR HAND RIGHT WO CONTRAST  Result Date: 05/30/2020 CLINICAL DATA:  Pain and swelling of the fifth finger and hand. EXAM: MRI OF THE RIGHT HAND WITHOUT CONTRAST TECHNIQUE: Multiplanar, multisequence MR imaging of the right hand was performed. No intravenous contrast was administered. COMPARISON:  Radiographs 05/27/2020 FINDINGS: Severe and diffuse subcutaneous soft tissue swelling/edema/fluid most severely involving the fifth finger. The entire hand is involved but is more notable along the  ulnar and dorsal aspects. There is also significant myositis involving the lateral hand musculature. No obvious findings for pyomyositis. I do not see any definite MR findings to suggest septic arthritis or osteomyelitis. Exam is somewhat limited by motion artifact and poor distal fat saturation. The flexor and extensor tendons are intact. Mild interphalangeal joint degenerative changes and moderate first MCP joint degenerative changes. No findings for erosive arthropathy. Degenerative and cystic changes noted at the wrist. IMPRESSION: 1. Severe and diffuse subcutaneous soft tissue swelling/edema/fluid most severely involving the fifth finger. This is likely severe cellulitis. 2. Myositis involving the lateral hand musculature. No obvious findings for pyomyositis. 3. No definite MR findings for septic arthritis or osteomyelitis. Electronically Signed   By: Rudie Meyer M.D.   On: 05/30/2020 08:01    Positive ROS: All other systems have been reviewed and were otherwise negative with the exception of those mentioned in the HPI and as above.  Physical Exam: Constitutional: Healthy-appearing and normal body habitus who is in NAD. Ambulation normal.  Psychiatric: Normal affect. Oriented x3  Cardiovascular: Right radial pulse 2+ and capillary refill test normal. Edema none.  Musculoskeletal: Right hand no atrophy. Strength right APB 5/5 and 1st DI 5/5.  Neurologic: Normal sensation to the ulnar nerve distribution, radial nerve distribution, and median nerve distribution.  Hand: Examination of the right hand shows a profusely swollen and erythematous right small finger. Dorsally there is some fluctuance with concern for abscess over the middle phalanx and PIP joint. He has tenderness diffusely along the palmar aspect of the finger and flexor tendon sheath. He has significant pain with any attempted active flexion or extension of the digit as well as any passive flexion or extension of the digit. There is  some erythema dorsally as well over the small finger MP joint which does come proximal into the hand some as well. The remaining digits are without swelling or signs of infection. His fingertips are warm well perfused with brisk capillary refill. He does have intact sensation throughout all digits.  Assessment: Severe right small finger infection  Plan: -Continue care under the hospitalist service with IV antibiotics -Plan to proceed forward to the OR later today for irrigation debridement of the right small finger. -Risk, benefits and alternatives of procedure were discussed with the patient today.  These risks include but are not limited to infection, bleeding, damage to surrounding structures including blood vessels and nerves, pain, stiffness, need for additional procedures and loss of digit. -Continue n.p.o. status until postop. -Elevation of right hand to help with pain and swelling.  Remainder of care per primary.    Ernest Mallick, MD [phone]   05/30/2020 8:32 AM

## 2020-05-30 NOTE — Progress Notes (Signed)
PROGRESS NOTE    Jonathan Carney  PPI:[id number] DOB: 19-Dec-1960 DOA: 05/29/2020 PCP: Fleet Contras, MD   Chief Complain: Right hand swelling  Brief Narrative: Patient is a 63 gentleman with history of hypertension, hyperlipidemia, tobacco abuse who presents to the emergency room with complaints of worsening of right arm swelling.  As per the report, he hurt his hand on the railing at his home when he was with his dog.  The little finger on the right hand started swelling progressively and he went to urgent care and was given antibiotics, x-ray at that time did not show any fracture.  Patient was seen at [facility] and was directed to the emergency department.  No fever or chills.  He had mild leukocytosis on presentation.  Hand surgery consulted today.  Plan for irrigation/debridement of this right small finger  Assessment & Plan:   Active Problems:   Cellulitis of hand   Right hand cellulitis: Involving the right hand little finger, medial aspect of the right hand palm.  Currently on IV antibiotics.  Hand surgery consulted.    X-ray did not show any fracture or abscess.  Keep n.p.o. MRI showed severe and diffuse subcutaneous soft tissue swelling/edema/fluid most severely involving the fifth finger suggesting severe cellulitis,myositis involving the lateral hand musculature,no definite MR findings for septic arthritis or osteomyelitis. Hand surgery planning for debridement.  Hypertension: Continue as needed medication for severe hypertension.  Hyperlipidemia: On statin  Tobacco abuse: Advised quitting  Normocytic anemia: Currently hemoglobin stable.  Vitamin D deficiency: Started on supplementation.         DVT prophylaxis:SCD Code Status: Full Family Communication: None at bedside Status is: Inpatient  Remains inpatient appropriate because:Inpatient level of care appropriate due to severity of illness   Dispo: The patient is from: Home              Anticipated d/c is  to: Home              Patient currently is not medically stable to d/c.   Difficult to place patient No     Consultants: Orthopedics  Procedures:None yet  Antimicrobials:  Anti-infectives (From admission, onward)   Start     Dose/Rate Route Frequency Ordered Stop   05/30/20 1030  vancomycin (VANCOREADY) IVPB 1000 mg/200 mL        1,000 mg 200 mL/hr over 60 Minutes Intravenous Every 12 hours 05/29/20 2146     05/29/20 2330  ceFEPIme (MAXIPIME) 2 g in sodium chloride 0.9 % 100 mL IVPB        2 g 200 mL/hr over 30 Minutes Intravenous Every 8 hours 05/29/20 2226     05/29/20 2230  vancomycin (VANCOREADY) IVPB 1000 mg/200 mL  Status:  Discontinued        1,000 mg 200 mL/hr over 60 Minutes Intravenous  Once 05/29/20 2226 05/29/20 2231   05/29/20 2100  vancomycin (VANCOREADY) IVPB 1500 mg/300 mL        1,500 mg 150 mL/hr over 120 Minutes Intravenous  Once 05/29/20 2041 05/29/20 2331   05/29/20 2045  cefTRIAXone (ROCEPHIN) 2 g in sodium chloride 0.9 % 100 mL IVPB        2 g 200 mL/hr over 30 Minutes Intravenous  Once 05/29/20 2032 05/29/20 2154      Subjective:  Patient seen and examined the bedside this morning.  Hemodynamically stable during my evaluation.  Has severe swelling of right hand.  Complains of some pain. Objective: Vitals:   05/29/20 1911 05/29/20  1912 05/30/20 0043 05/30/20 0534  BP: (!) 151/75  (!) 166/92 (!) 147/82  Pulse: 98  78 79  Resp: 18  17 18   Temp:  99 F (37.2 C) 98.2 F (36.8 C) 98.7 F (37.1 C)  TempSrc:  Oral Oral Oral  SpO2: 95%  95% 98%    Intake/Output Summary (Last 24 hours) at 05/30/2020 0805 Last data filed at 05/30/2020 0753 Gross per 24 hour  Intake 916.53 ml  Output --  Net 916.53 ml   There were no vitals filed for this visit.  Examination:  General exam: Appears calm and comfortable ,Not in distress,obese HEENT:PERRL,Oral mucosa moist, Ear/Nose normal on gross exam Respiratory system: Bilateral equal air entry, normal  vesicular breath sounds, no wheezes or crackles  Cardiovascular system: S1 & S2 heard, RRR. No JVD, murmurs, rubs, gallops or clicks. No pedal edema. Gastrointestinal system: Abdomen is nondistended, soft and nontender. No organomegaly or masses felt. Normal bowel sounds heard. Central nervous system: Alert and oriented. No focal neurological deficits. Extremities: Severe swelling of right hand, cellulitic changes on the right small finger Skin: No rashes, lesions or ulcers,no icterus ,no pallor    Data Reviewed: I have personally reviewed following labs and imaging studies  CBC: Recent Labs  Lab 05/29/20 1933  WBC 11.5*  NEUTROABS 8.7*  HGB 12.6*  HCT 38.8*  MCV 95.6  PLT 339   Basic Metabolic Panel: Recent Labs  Lab 05/29/20 1933  NA 139  K 3.7  CL 107  CO2 24  GLUCOSE 113*  BUN 15  CREATININE 1.05  CALCIUM 8.9   GFR: CrCl cannot be calculated (Unknown ideal weight.). Liver Function Tests: Recent Labs  Lab 05/29/20 1933  AST 32  ALT 30  ALKPHOS 77  BILITOT 0.8  PROT 7.1  ALBUMIN 3.5   No results for input(s): LIPASE, AMYLASE in the last 168 hours. No results for input(s): AMMONIA in the last 168 hours. Coagulation Profile: No results for input(s): INR, PROTIME in the last 168 hours. Cardiac Enzymes: No results for input(s): CKTOTAL, CKMB, CKMBINDEX, TROPONINI in the last 168 hours. BNP (last 3 results) No results for input(s): PROBNP in the last 8760 hours. HbA1C: No results for input(s): HGBA1C in the last 72 hours. CBG: No results for input(s): GLUCAP in the last 168 hours. Lipid Profile: No results for input(s): CHOL, HDL, LDLCALC, TRIG, CHOLHDL, LDLDIRECT in the last 72 hours. Thyroid Function Tests: No results for input(s): TSH, T4TOTAL, FREET4, T3FREE, THYROIDAB in the last 72 hours. Anemia Panel: No results for input(s): VITAMINB12, FOLATE, FERRITIN, TIBC, IRON, RETICCTPCT in the last 72 hours. Sepsis Labs: Recent Labs  Lab 05/29/20 1933   LATICACIDVEN 0.9    Recent Results (from the past 240 hour(s))  SARS CORONAVIRUS 2 (TAT 6-24 HRS) Nasopharyngeal Nasopharyngeal Swab     Status: None   Collection Time: 05/30/20 12:23 AM   Specimen: Nasopharyngeal Swab  Result Value Ref Range Status   SARS Coronavirus 2 NEGATIVE NEGATIVE Final    Comment: (NOTE) SARS-CoV-2 target nucleic acids are NOT DETECTED.  The SARS-CoV-2 RNA is generally detectable in upper and lower respiratory specimens during the acute phase of infection. Negative results do not preclude SARS-CoV-2 infection, do not rule out co-infections with other pathogens, and should not be used as the sole basis for treatment or other patient management decisions. Negative results must be combined with clinical observations, patient history, and epidemiological information. The expected result is Negative.  Fact Sheet for Patients: 06/01/20  Fact Sheet  for Healthcare Providers: quierodirigir.com  This test is not yet approved or cleared by the [place] and  has been authorized for detection and/or diagnosis of SARS-CoV-2 by FDA under an Emergency Use Authorization (EUA). This EUA will remain  in effect (meaning this test can be used) for the duration of the COVID-19 declaration under Se ction 564(b)(1) of the Act, 21 U.S.C. section 360bbb-3(b)(1), unless the authorization is terminated or revoked sooner.  Performed at [facility], [address] [place], [place] [address]          Radiology Studies: MR HAND RIGHT WO CONTRAST  Result Date: 05/30/2020 CLINICAL DATA:  Pain and swelling of the fifth finger and hand. EXAM: MRI OF THE RIGHT HAND WITHOUT CONTRAST TECHNIQUE: Multiplanar, multisequence MR imaging of the right hand was performed. No intravenous contrast was administered. COMPARISON:  Radiographs 05/27/2020 FINDINGS: Severe and diffuse subcutaneous soft tissue  swelling/edema/fluid most severely involving the fifth finger. The entire hand is involved but is more notable along the ulnar and dorsal aspects. There is also significant myositis involving the lateral hand musculature. No obvious findings for pyomyositis. I do not see any definite MR findings to suggest septic arthritis or osteomyelitis. Exam is somewhat limited by motion artifact and poor distal fat saturation. The flexor and extensor tendons are intact. Mild interphalangeal joint degenerative changes and moderate first MCP joint degenerative changes. No findings for erosive arthropathy. Degenerative and cystic changes noted at the wrist. IMPRESSION: 1. Severe and diffuse subcutaneous soft tissue swelling/edema/fluid most severely involving the fifth finger. This is likely severe cellulitis. 2. Myositis involving the lateral hand musculature. No obvious findings for pyomyositis. 3. No definite MR findings for septic arthritis or osteomyelitis. Electronically Signed   By: Rudie Meyer M.D.   On: 05/30/2020 08:01        Scheduled Meds: Continuous Infusions: . sodium chloride Stopped (05/30/20 0603)  . ceFEPime (MAXIPIME) IV 2 g (05/30/20 0754)  . vancomycin       LOS: 1 day    Time spent: 35 mins.More than 50% of that time was spent in counseling and/or coordination of care.      Burnadette Pop, MD [facility] P3/24/2022, 8:05 AM

## 2020-05-30 NOTE — Anesthesia Procedure Notes (Signed)
Procedure Name: LMA Insertion Date/Time: 05/30/2020 7:59 PM Performed by: Jed Limerick, CRNA Pre-anesthesia Checklist: Patient identified, Emergency Drugs available, Suction available and Patient being monitored Patient Re-evaluated:Patient Re-evaluated prior to induction Oxygen Delivery Method: Circle System Utilized Preoxygenation: Pre-oxygenation with 100% oxygen Induction Type: IV induction LMA: LMA inserted LMA Size: 5.0 Number of attempts: 1 Placement Confirmation: positive ETCO2 Tube secured with: Tape Dental Injury: Teeth and Oropharynx as per pre-operative assessment

## 2020-05-30 NOTE — Anesthesia Preprocedure Evaluation (Addendum)
Anesthesia Evaluation  Patient identified by MRN, date of birth, ID band Patient awake    Reviewed: Allergy & Precautions, NPO status , Patient's Chart, lab work & pertinent test results  History of Anesthesia Complications Negative for: history of anesthetic complications  Airway Mallampati: II  TM Distance: >3 FB Neck ROM: Full    Dental  (+) Dental Advisory Given, Chipped, Poor Dentition, Loose, Missing,    Pulmonary Current Smoker,    Pulmonary exam normal breath sounds clear to auscultation       Cardiovascular hypertension, Pt. on medications (-) angina(-) Past MI and (-) CHF Normal cardiovascular exam Rhythm:Regular Rate:Normal     Neuro/Psych Foraminal stenosis of cervical region negative psych ROS   GI/Hepatic negative GI ROS, Neg liver ROS,   Endo/Other  Morbid obesity  Renal/GU negative Renal ROS     Musculoskeletal  (+) Arthritis ,   Abdominal   Peds  Hematology negative hematology ROS (+)   Anesthesia Other Findings   Reproductive/Obstetrics                            Anesthesia Physical  Anesthesia Plan  ASA: II  Anesthesia Plan: General   Post-op Pain Management:    Induction: Intravenous  PONV Risk Score and Plan: 2 and Ondansetron, Dexamethasone, Midazolam and Treatment may vary due to age or medical condition  Airway Management Planned: LMA  Additional Equipment: None  Intra-op Plan:   Post-operative Plan: Extubation in OR  Informed Consent: I have reviewed the patients History and Physical, chart, labs and discussed the procedure including the risks, benefits and alternatives for the proposed anesthesia with the patient or authorized representative who has indicated his/her understanding and acceptance.     Dental advisory given  Plan Discussed with: CRNA  Anesthesia Plan Comments: (Pt states he's had nothing to eat or drink for 2 days.   )        Anesthesia Quick Evaluation

## 2020-05-30 NOTE — Transfer of Care (Signed)
Immediate Anesthesia Transfer of Care Note  Patient: Jonathan Carney  Procedure(s) Performed: IRRIGATION AND DEBRIDEMENT OF RIGHT SMALL  FINGER (Right Finger)  Patient Location: PACU  Anesthesia Type:General  Level of Consciousness: awake  Airway & Oxygen Therapy: Patient Spontanous Breathing and Patient connected to face mask oxygen  Post-op Assessment: Report given to RN and Post -op Vital signs reviewed and stable  Post vital signs: Reviewed and stable  Last Vitals:  Vitals Value Taken Time  BP 144/92 05/30/20 2109  Temp 36.4 C 05/30/20 2105  Pulse 90 05/30/20 2110  Resp 17 05/30/20 2110  SpO2 100 % 05/30/20 2110  Vitals shown include unvalidated device data.  Last Pain:  Vitals:   05/30/20 2105  TempSrc:   PainSc: Asleep         Complications: No complications documented.

## 2020-05-31 ENCOUNTER — Other Ambulatory Visit: Payer: Self-pay

## 2020-05-31 ENCOUNTER — Encounter (HOSPITAL_COMMUNITY): Payer: Self-pay | Admitting: Orthopaedic Surgery

## 2020-05-31 LAB — CBC WITH DIFFERENTIAL/PLATELET
Abs Immature Granulocytes: 0.06 10*3/uL (ref 0.00–0.07)
Basophils Absolute: 0 10*3/uL (ref 0.0–0.1)
Basophils Relative: 0 %
Eosinophils Absolute: 0 10*3/uL (ref 0.0–0.5)
Eosinophils Relative: 0 %
HCT: 37.5 % — ABNORMAL LOW (ref 39.0–52.0)
Hemoglobin: 12.3 g/dL — ABNORMAL LOW (ref 13.0–17.0)
Immature Granulocytes: 1 %
Lymphocytes Relative: 5 %
Lymphs Abs: 0.7 10*3/uL (ref 0.7–4.0)
MCH: 31.1 pg (ref 26.0–34.0)
MCHC: 32.8 g/dL (ref 30.0–36.0)
MCV: 94.7 fL (ref 80.0–100.0)
Monocytes Absolute: 0.3 10*3/uL (ref 0.1–1.0)
Monocytes Relative: 2 %
Neutro Abs: 12.2 10*3/uL — ABNORMAL HIGH (ref 1.7–7.7)
Neutrophils Relative %: 92 %
Platelets: 329 10*3/uL (ref 150–400)
RBC: 3.96 MIL/uL — ABNORMAL LOW (ref 4.22–5.81)
RDW: 13.8 % (ref 11.5–15.5)
WBC: 13.3 10*3/uL — ABNORMAL HIGH (ref 4.0–10.5)
nRBC: 0 % (ref 0.0–0.2)

## 2020-05-31 LAB — VANCOMYCIN, TROUGH: Vancomycin Tr: 9 ug/mL — ABNORMAL LOW (ref 15–20)

## 2020-05-31 LAB — VANCOMYCIN, PEAK: Vancomycin Pk: 21 ug/mL — ABNORMAL LOW (ref 30–40)

## 2020-05-31 MED ORDER — AMLODIPINE BESYLATE 5 MG PO TABS: 5.0000 mg | ORAL_TABLET | Freq: Every day | ORAL | Status: DC

## 2020-05-31 MED ORDER — VANCOMYCIN HCL 1500 MG/300ML IV SOLN
1500.0000 mg | Freq: Two times a day (BID) | INTRAVENOUS | Status: DC
  Administered 2020-05-31 – 2020-06-01 (×2): 1500 mg via INTRAVENOUS
  Filled 2020-05-31 (×3): qty 300

## 2020-05-31 MED ORDER — AMLODIPINE BESYLATE 10 MG PO TABS
10.0000 mg | ORAL_TABLET | Freq: Every day | ORAL | Status: DC
  Administered 2020-05-31 – 2020-06-01 (×2): 10 mg via ORAL
  Filled 2020-05-31 (×2): qty 1

## 2020-05-31 NOTE — Progress Notes (Signed)
Pharmacy Antibiotic Note  Jonathan Carney is a 60 y.o. male admitted on 05/29/2020 with cellulitis.  Pharmacy has been consulted for Vancomcyin dosing.  ID: [name] severe cellulitis est wt 115kg. WBC 11. No positive cultures MRI- No osteo   Vancomycin 3/23> cefe 3/23> PTA Doxy 3/21>>3/23   3/25: Peak 21, Trough 9= AUC 358.1: Incr 1500mg /12h   Plan: Increase Vancomycin 1500 mg IV Q 12 hrs.  Goal AUC 400-550. New Expected AUC: 536    Height: 6' (182.9 cm) Weight: 114.4 kg (252 lb 3.3 oz) IBW/kg (Calculated) : 77.6  Temp (24hrs), Avg:97.9 F (36.6 C), Min:97.5 F (36.4 C), Max:98.2 F (36.8 C)  Recent Labs  Lab 05/29/20 1933 05/31/20 0135 05/31/20 0925 05/31/20 1356  WBC 11.5* 13.3*  --   --   CREATININE 1.05  --   --   --   LATICACIDVEN 0.9  --   --   --   VANCOTROUGH  --   --  9*  --   VANCOPEAK  --   --   --  21*    Estimated Creatinine Clearance: 100.1 mL/min (by C-G formula based on SCr of 1.05 mg/dL).    No Known Allergies  Crystal S. 06/02/20, PharmD, BCPS Clinical Staff Pharmacist [email or web address]  [name] 05/31/2020 4:09 PM

## 2020-05-31 NOTE — Progress Notes (Signed)
PROGRESS NOTE    Jonathan Carney  SWN:[id number] DOB: 1960/11/03 DOA: 05/29/2020 PCP: Fleet Contras, MD   Chief Complain: Right hand swelling  Brief Narrative: Patient is a 60 gentleman with history of hypertension, hyperlipidemia, tobacco abuse who presents to the emergency room with complaints of worsening of right arm swelling.  As per the report, he hurt his hand on the railing at his home when he was with his dog.  The little finger on the right hand started swelling progressively and he went to urgent care and was given antibiotics, x-ray at that time did not show any fracture.  Patient was seen at [facility] and was directed to the emergency department.  No fever or chills.  He had mild leukocytosis on presentation.  Hand surgery consulted and he uderwent  irrigation/debridement of this right small finger on 05/31/20. PT consulted for her therapy.  Orthopedics recommending continue inpatient care for IV antibiotics and wound care  Assessment & Plan:   Active Problems:   Cellulitis of hand   Right hand cellulitis: Underwent irrigation/debridement of a small right finger/ irrigation debridement of dorsal small right finger abscess/flexor tenosynovectomy on 3/254/22 .   We will follow up culture report. MRI had shown severe and diffuse subcutaneous soft tissue swelling/edema/fluid most severely involving the fifth finger suggesting severe cellulitis,myositis involving the lateral hand musculature,no definite MR findings for septic arthritis or osteomyelitis. PT consulted for continuing wound care/hydrotherapy.  Will continue IV antibiotics. He has mild leucocytosis  Hypertension: No history of hypertension in the past.  Patient is persistently hypertensive.  Will start on amlodipine.  Hyperlipidemia: On statin  Tobacco abuse: Advised quitting  Normocytic anemia: Currently hemoglobin stable.  Vitamin D deficiency: Started on supplementation.         DVT prophylaxis:  Lovenox Code Status: Full Family Communication: None at bedside Status is: Inpatient  Remains inpatient appropriate because:Inpatient level of care appropriate due to severity of illness   Dispo: The patient is from: Home              Anticipated d/c is to: Home              Patient currently is not medically stable to d/c.   Difficult to place patient No     Consultants: Orthopedics  Procedures:None yet  Antimicrobials:  Anti-infectives (From admission, onward)   Start     Dose/Rate Route Frequency Ordered Stop   05/30/20 1030  vancomycin (VANCOREADY) IVPB 1000 mg/200 mL        1,000 mg 200 mL/hr over 60 Minutes Intravenous Every 12 hours 05/29/20 2146     05/29/20 2330  ceFEPIme (MAXIPIME) 2 g in sodium chloride 0.9 % 100 mL IVPB        2 g 200 mL/hr over 30 Minutes Intravenous Every 8 hours 05/29/20 2226     05/29/20 2230  vancomycin (VANCOREADY) IVPB 1000 mg/200 mL  Status:  Discontinued        1,000 mg 200 mL/hr over 60 Minutes Intravenous  Once 05/29/20 2226 05/29/20 2231   05/29/20 2100  vancomycin (VANCOREADY) IVPB 1500 mg/300 mL        1,500 mg 150 mL/hr over 120 Minutes Intravenous  Once 05/29/20 2041 05/29/20 2331   05/29/20 2045  cefTRIAXone (ROCEPHIN) 2 g in sodium chloride 0.9 % 100 mL IVPB        2 g 200 mL/hr over 30 Minutes Intravenous  Once 05/29/20 2032 05/29/20 2154      Subjective:  Patient  seen and examined the bedside this morning.  Hemodynamically stable.  Very eager to go home and says he wants to go home today.  I explained that its up to the orthopedic surgeon.  Denies any complaints  Objective: Vitals:   05/30/20 2200 05/30/20 2225 05/31/20 0140 05/31/20 0542  BP: (!) 151/87 (!) 160/86 121/67 (!) 157/110  Pulse: 91 71 75 91  Resp: (!) 26 17 17 17   Temp:  97.7 F (36.5 C) 98.1 F (36.7 C) 98.2 F (36.8 C)  TempSrc:      SpO2: 99% 95% 96% 99%  Weight:      Height:        Intake/Output Summary (Last 24 hours) at 05/31/2020  0806 Last data filed at 05/31/2020 0751 Gross per 24 hour  Intake 2119.86 ml  Output 630 ml  Net 1489.86 ml   Filed Weights   05/30/20 1500  Weight: 114.4 kg    Examination:  General exam: Overall comfortable, not in distress HEENT: PERRL Respiratory system:  no wheezes or crackles  Cardiovascular system: S1 & S2 heard, RRR.  Gastrointestinal system: Abdomen is nondistended, soft and nontender. Central nervous system: Alert and oriented Extremities: No edema, no clubbing ,no cyanosis, right hand wrapped with dressing Skin: No rashes, no ulcers,no icterus    Data Reviewed: I have personally reviewed following labs and imaging studies  CBC: Recent Labs  Lab 05/29/20 1933 05/31/20 0135  WBC 11.5* 13.3*  NEUTROABS 8.7* 12.2*  HGB 12.6* 12.3*  HCT 38.8* 37.5*  MCV 95.6 94.7  PLT 339 329   Basic Metabolic Panel: Recent Labs  Lab 05/29/20 1933  NA 139  K 3.7  CL 107  CO2 24  GLUCOSE 113*  BUN 15  CREATININE 1.05  CALCIUM 8.9   GFR: Estimated Creatinine Clearance: 100.1 mL/min (by C-G formula based on SCr of 1.05 mg/dL). Liver Function Tests: Recent Labs  Lab 05/29/20 1933  AST 32  ALT 30  ALKPHOS 77  BILITOT 0.8  PROT 7.1  ALBUMIN 3.5   No results for input(s): LIPASE, AMYLASE in the last 168 hours. No results for input(s): AMMONIA in the last 168 hours. Coagulation Profile: No results for input(s): INR, PROTIME in the last 168 hours. Cardiac Enzymes: No results for input(s): CKTOTAL, CKMB, CKMBINDEX, TROPONINI in the last 168 hours. BNP (last 3 results) No results for input(s): PROBNP in the last 8760 hours. HbA1C: No results for input(s): HGBA1C in the last 72 hours. CBG: No results for input(s): GLUCAP in the last 168 hours. Lipid Profile: No results for input(s): CHOL, HDL, LDLCALC, TRIG, CHOLHDL, LDLDIRECT in the last 72 hours. Thyroid Function Tests: No results for input(s): TSH, T4TOTAL, FREET4, T3FREE, THYROIDAB in the last 72  hours. Anemia Panel: No results for input(s): VITAMINB12, FOLATE, FERRITIN, TIBC, IRON, RETICCTPCT in the last 72 hours. Sepsis Labs: Recent Labs  Lab 05/29/20 1933  LATICACIDVEN 0.9    Recent Results (from the past 240 hour(s))  SARS CORONAVIRUS 2 (TAT 6-24 HRS) Nasopharyngeal Nasopharyngeal Swab     Status: None   Collection Time: 05/30/20 12:23 AM   Specimen: Nasopharyngeal Swab  Result Value Ref Range Status   SARS Coronavirus 2 NEGATIVE NEGATIVE Final    Comment: (NOTE) SARS-CoV-2 target nucleic acids are NOT DETECTED.  The SARS-CoV-2 RNA is generally detectable in upper and lower respiratory specimens during the acute phase of infection. Negative results do not preclude SARS-CoV-2 infection, do not rule out co-infections with other pathogens, and should not be used  as the sole basis for treatment or other patient management decisions. Negative results must be combined with clinical observations, patient history, and epidemiological information. The expected result is Negative.  Fact Sheet for Patients: HairSlick.no  Fact Sheet for Healthcare Providers: quierodirigir.com  This test is not yet approved or cleared by the [place] FDA and  has been authorized for detection and/or diagnosis of SARS-CoV-2 by FDA under an Emergency Use Authorization (EUA). This EUA will remain  in effect (meaning this test can be used) for the duration of the COVID-19 declaration under Se ction 564(b)(1) of the Act, 21 U.S.C. section 360bbb-3(b)(1), unless the authorization is terminated or revoked sooner.  Performed at [facility], [address], [place], [place] [address]   Surgical pcr screen     Status: None   Collection Time: 05/30/20  5:33 PM   Specimen: Nasal Mucosa; Nasal Swab  Result Value Ref Range Status   MRSA, PCR NEGATIVE NEGATIVE Final   Staphylococcus aureus NEGATIVE NEGATIVE Final    Comment:  (NOTE) The Xpert SA Assay (FDA approved for NASAL specimens in patients 22 years of age and older), is one component of a comprehensive surveillance program. It is not intended to diagnose infection nor to guide or monitor treatment. Performed at [facility], [address] [place], [place] [address]          Radiology Studies: MR HAND RIGHT WO CONTRAST  Result Date: 05/30/2020 CLINICAL DATA:  Pain and swelling of the fifth finger and hand. EXAM: MRI OF THE RIGHT HAND WITHOUT CONTRAST TECHNIQUE: Multiplanar, multisequence MR imaging of the right hand was performed. No intravenous contrast was administered. COMPARISON:  Radiographs 05/27/2020 FINDINGS: Severe and diffuse subcutaneous soft tissue swelling/edema/fluid most severely involving the fifth finger. The entire hand is involved but is more notable along the ulnar and dorsal aspects. There is also significant myositis involving the lateral hand musculature. No obvious findings for pyomyositis. I do not see any definite MR findings to suggest septic arthritis or osteomyelitis. Exam is somewhat limited by motion artifact and poor distal fat saturation. The flexor and extensor tendons are intact. Mild interphalangeal joint degenerative changes and moderate first MCP joint degenerative changes. No findings for erosive arthropathy. Degenerative and cystic changes noted at the wrist. IMPRESSION: 1. Severe and diffuse subcutaneous soft tissue swelling/edema/fluid most severely involving the fifth finger. This is likely severe cellulitis. 2. Myositis involving the lateral hand musculature. No obvious findings for pyomyositis. 3. No definite MR findings for septic arthritis or osteomyelitis. Electronically Signed   By: Rudie Meyer M.D.   On: 05/30/2020 08:01        Scheduled Meds: . chlorhexidine  15 mL Mouth/Throat Once  . HYDROmorphone      . HYDROmorphone      . Vitamin D (Ergocalciferol)  50,000 Units Oral Q7 days   Continuous  Infusions: . ceFEPime (MAXIPIME) IV 2 g (05/31/20 0741)  . lactated ringers    . vancomycin Stopped (05/31/20 0049)     LOS: 2 days    Time spent: 35 mins.More than 50% of that time was spent in counseling and/or coordination of care.      Burnadette Pop, MD [facility] P3/25/2022, 8:06 AM

## 2020-05-31 NOTE — Anesthesia Postprocedure Evaluation (Signed)
Anesthesia Post Note  Patient: Stage manager  Procedure(s) Performed: IRRIGATION AND DEBRIDEMENT OF RIGHT SMALL  FINGER (Right Finger)     Patient location during evaluation: PACU Anesthesia Type: General Level of consciousness: awake and alert Pain management: pain level controlled Vital Signs Assessment: post-procedure vital signs reviewed and stable Respiratory status: spontaneous breathing, nonlabored ventilation, respiratory function stable and patient connected to nasal cannula oxygen Cardiovascular status: blood pressure returned to baseline and stable Postop Assessment: no apparent nausea or vomiting Anesthetic complications: no   No complications documented.  Last Vitals:  Vitals:   05/30/20 2200 05/30/20 2225  BP: (!) 151/87 (!) 160/86  Pulse: 91 71  Resp: (!) 26 17  Temp:  36.5 C  SpO2: 99% 95%    Last Pain:  Vitals:   05/30/20 2330  TempSrc:   PainSc: 5                  Hung Rhinesmith P [place]

## 2020-05-31 NOTE — Progress Notes (Signed)
Physical Therapy Wound Treatment Patient Details  Name: Logon Uttech MRN: [id number] Date of Birth: 07/27/1960  Today's Date: 05/31/2020 Time: 7124-5809 Time Calculation (min): 44 min  Subjective  Subjective Assessment Subjective: I swatted at my dog and hit my finger on a metal rail. Date of Onset:  (unknown) Prior Treatments: I and D 3/24  Pain Score:  4-6/10, premedicated  Wound Assessment  Wound / Incision (Open or Dehisced) 05/31/20 Incision - Open Hand Anterior;Right;Posterior 5th finger abscess with dorsal and palmar drainage wound access (Active)  Wound Image    05/31/20 1239  Dressing Type Compression wrap;Gauze (Comment);Impregnated gauze (bismuth);Moist to dry;Normal saline moist dressing 05/31/20 1239  Dressing Changed Changed 05/31/20 1239  Dressing Status Clean;Intact 05/31/20 1239  Dressing Change Frequency Daily 05/31/20 1239  Site / Wound Assessment Purple;Red;Dusky 05/31/20 1239  % Wound base Red or Granulating 95% 05/31/20 1239  % Wound base Yellow/Fibrinous Exudate 5% 05/31/20 1239  % Wound base Black/Eschar 0% 05/31/20 1239  % Wound base Other/Granulation Tissue (Comment) 0% 05/31/20 1239  Peri-wound Assessment Induration;Intact 05/31/20 1239  Wound Length (cm) 7 cm 05/31/20 1239  Wound Width (cm) 5 cm 05/31/20 1239  Wound Depth (cm) 0.5 cm 05/31/20 1239  Wound Volume (cm^3) 17.5 cm^3 05/31/20 1239  Wound Surface Area (cm^2) 35 cm^2 05/31/20 1239  Margins Attached edges (approximated) 05/31/20 1239  Closure Surface sutures 05/31/20 1239  Drainage Amount Minimal 05/31/20 1239  Drainage Description Serous;Sanguineous 05/31/20 1239  Treatment Cleansed;Debridement (Selective);Hydrotherapy (Pulse lavage);Packing (Saline gauze) 05/31/20 1239      Hydrotherapy Pulsed lavage therapy - wound location: 5th right finger and palmer (hypothenar area) and dorsal drain site. Pulsed Lavage with Suction (psi): 4 psi Pulsed Lavage with Suction - Normal Saline Used: 1000  mL Pulsed Lavage Tip: Tip with splash shield Selective Debridement Selective Debridement - Location: slough at drain sites Selective Debridement - Tools Used: Forceps,Scissors Selective Debridement - Tissue Removed: slough    Wound Assessment and Plan  Wound Therapy - Assess/Plan/Recommendations Wound Therapy - Clinical Statement: pt s/p I and D and drain placement to the 5th finger, dorsal and plantar drains.  Pt will benefit from Pulsed lavage for cleansing the wound for main purpose in decreasing the bio-burden at the wounds and wicking to draw any infection from the wound. Wound Therapy - Functional Problem List: decreased Mobility of R hand Factors Delaying/Impairing Wound Healing: Infection - systemic/local,Tobacco use Hydrotherapy Plan: Debridement,Dressing change,Pulsatile lavage with suction,Patient/family education Wound Therapy - Frequency: 6X / week Wound Therapy - Follow Up Recommendations: dressing changes by family/patient  Wound Therapy Goals- Improve the function of patient's integumentary system by progressing the wound(s) through the phases of wound healing (inflammation - proliferation - remodeling) by: Wound Therapy Goals - Improve the function of patient's integumentary system by progressing the wound(s) through the phases of wound healing by: Decrease Necrotic Tissue to: 0% Decrease Necrotic Tissue - Progress: Goal set today Increase Granulation Tissue to: 100% Increase Granulation Tissue - Progress: Goal set today Patient/Family will be able to : manage simple dressing change. Patient/Family Instruction Goal - Progress: Goal set today Goals/treatment plan/discharge plan were made with and agreed upon by patient/family: Yes Time For Goal Achievement: 7 days Wound Therapy - Potential for Goals: Good  Goals will be updated until maximal potential achieved or discharge criteria met.  Discharge criteria: when goals achieved, discharge from hospital, MD  decision/surgical intervention, no progress towards goals, refusal/missing three consecutive treatments without notification or medical reason.  GP     Charges  PT Wound Care Charges $Wound Debridement up to 20 cm: < or equal to 20 cm $PT Hydrotherapy Dressing: 1 dressing $PT PLS Gun and Tip: 1 Supply $PT Hydrotherapy Visit: 1 Visit     05/31/2020  Ginger Carne., PT Acute Rehabilitation Services [phone]  (pager) [phone]  (office) Tessie Fass Dreden Rivere 05/31/2020, 12:53 PM

## 2020-05-31 NOTE — Progress Notes (Signed)
   Ortho Hand Progress Note  Subjective: No acute events post op.   Objective: Vital signs in last 24 hours: Temp:  [97.5 F (36.4 C)-98.3 F (36.8 C)] 98.2 F (36.8 C) (03/25 0542) Pulse Rate:  [71-94] 91 (03/25 0542) Resp:  [17-26] 17 (03/25 0542) BP: (121-160)/(67-110) 157/110 (03/25 0542) SpO2:  [94 %-99 %] 99 % (03/25 0542) Weight:  [114.4 kg] 114.4 kg (03/24 1500)  Intake/Output from previous day: 03/24 0701 - 03/25 0700 In: 1879.9 [P.O.:480; I.V.:700; IV Piggyback:699.9] Out: 630 [Urine:625; Blood:5] Intake/Output this shift: Total I/O In: 240 [P.O.:240] Out: -   Recent Labs    05/29/20 1933 05/31/20 0135  HGB 12.6* 12.3*   Recent Labs    05/29/20 1933 05/31/20 0135  WBC 11.5* 13.3*  RBC 4.06* 3.96*  HCT 38.8* 37.5*  PLT 339 329   Recent Labs    05/29/20 1933  NA 139  K 3.7  CL 107  CO2 24  BUN 15  CREATININE 1.05  GLUCOSE 113*  CALCIUM 8.9   No results for input(s): LABPT, INR in the last 72 hours.  Aaox3 nad Resp nonlabored RRR RUE: SF with superficial, circumferential skin loss. Deep dermis still healthy and intact. Volar and dorsal wounds with minimal drainage. Intact gentle active flex/ex of SF. Finger tip is wwp with bcr. Intact sensation to radial and ulnar aspect of the finger   Assessment/Plan: R SF severe infection with flexortenosynovitis s/p I&D. DOS 3/24  - Hospitalist primary. Appreciate management - Cultures pending. Tailor abx as needed one cultures return - Continue PT hydrotherapy and dressing changes - Elevate RUE for pain and swelling  Recommend continued inpatient care for abx, and pt wound care.    Cain Saupe III 05/31/2020, 12:09 PM  [phone]

## 2020-06-01 MED ORDER — DOXYCYCLINE HYCLATE 100 MG PO CAPS: 100.0000 mg | ORAL_CAPSULE | Freq: Two times a day (BID) | ORAL | 0 refills | Status: AC

## 2020-06-01 MED ORDER — VITAMIN D (ERGOCALCIFEROL) 1.25 MG (50000 UNIT) PO CAPS: 50000.0000 [IU] | ORAL_CAPSULE | ORAL | 0 refills | Status: DC

## 2020-06-01 MED ORDER — OXYCODONE HCL 5 MG PO TABS: 5.0000 mg | ORAL_TABLET | ORAL | 0 refills | Status: DC | PRN

## 2020-06-01 MED ORDER — ATORVASTATIN CALCIUM 40 MG PO TABS
40.0000 mg | ORAL_TABLET | Freq: Every day | ORAL | Status: DC
  Administered 2020-06-01: 40 mg via ORAL
  Filled 2020-06-01: qty 1

## 2020-06-01 MED ORDER — AMLODIPINE BESYLATE 10 MG PO TABS: 10.0000 mg | ORAL_TABLET | Freq: Every day | ORAL | 1 refills | Status: DC

## 2020-06-01 NOTE — Discharge Summary (Signed)
Physician Discharge Summary  Jonathan Carney DJS:[id number] DOB: [birth year] DOA: 05/29/2020  PCP: Fleet Contras, MD  Admit date: 05/29/2020 Discharge date: 06/01/2020  Admitted From: Home Disposition:  Home  Discharge Condition:Stable CODE STATUS:FULL Diet recommendation: Heart Healthy  Brief/Interim Summary:  Patient is a 60 gentleman with history of hypertension, hyperlipidemia, tobacco abuse who presents to the emergency room with complaints of worsening of right arm swelling.  As per the report, he hurt his hand on the railing at his home when he was with his dog.  The little finger on the right hand started swelling progressively and he went to urgent care and was given antibiotics, x-ray at that time did not show any fracture.  Patient was seen at [facility] and was directed to the emergency department.  No fever or chills.  He had mild leukocytosis on presentation.  Hand surgery consulted and he uderwent  irrigation/debridement of this right small finger on 05/31/20.PT consulted for hyrdotherapy. He was on broad spectrum Abx. Wound culture is showing Staph aureus.  Final sensitivities not obtained yet.  Patient does not want to stay, adamantly requesting for discharge.  I talked to the orthopedics PA Barrie Dunker, who stated patient can be discharged from their perspective.  I have requested orthopedics for arranging outpatient follow-up.  Following problems were addressed during his hospitalization:    Right hand cellulitis: Underwent irrigation/debridement of a small right finger/ irrigation debridement of dorsal small right finger abscess/flexor tenosynovectomy on 3/254/22 .   MRI had shown severe and diffuse subcutaneous soft tissue swelling/edema/fluid most severely involving the fifth finger suggesting severe cellulitis,myositis involving the lateral hand musculature,no definite MR findings for septic arthritis or osteomyelitis. PTwas consulted for continuing wound  care/hydrotherapy.He has mild leucocytosis.  He was on broad spectrum Abx. Wound culture is showing Staph aureus.  Final sensitivities not obtained yet.  Patient does not want to stay, adamantly requesting for discharge.  I talked to the orthopedics PA Barrie Dunker, who stated patient can be discharged from their perspective.  I have requested orthopedics for arranging outpatient follow-up. I have requested for home health nurse to be arranged  Hypertension: . Started on amlodipine with improvement of BP.  Hyperlipidemia: On statin  Tobacco abuse: Advised quitting  Normocytic anemia: Currently hemoglobin stable.  Vitamin D deficiency: Started on supplementation.   Discharge Diagnoses:  Active Problems:   Cellulitis of hand    Discharge Instructions  Discharge Instructions    Diet - low sodium heart healthy   Complete by: As directed    Discharge instructions   Complete by: As directed    1)Please follow up with orthopedics as soon as possible as an outpatient. Name and number of the provider has been attached. Call for appointment on Monday 2)Take prescribed medications as instructed   Discharge wound care:   Complete by: As directed    As above   Increase activity slowly   Complete by: As directed      Allergies as of 06/01/2020   No Known Allergies     Medication List    TAKE these medications   amLODipine 10 MG tablet Commonly known as: NORVASC Take 1 tablet (10 mg total) by mouth daily. What changed:   medication strength  how much to take   atorvastatin 40 MG tablet Commonly known as: LIPITOR Take 40 mg by mouth daily.   doxycycline 100 MG capsule Commonly known as: VIBRAMYCIN Take 1 capsule (100 mg total) by mouth 2 (two) times daily for 10 days.  methocarbamol 500 MG tablet Commonly known as: ROBAXIN Take 1 tablet (500 mg total) by mouth every 6 (six) hours as needed for muscle spasms.   oxyCODONE 5 MG immediate release tablet Commonly  known as: Oxy IR/ROXICODONE Take 1 tablet (5 mg total) by mouth every 4 (four) hours as needed for moderate pain ((score 4 to 6)).   Vitamin D (Ergocalciferol) 1.25 MG (50000 UNIT) Caps capsule Commonly known as: DRISDOL Take 1 capsule (50,000 Units total) by mouth every 7 (seven) days. Start taking on: June 06, 2020            Discharge Care Instructions  (From admission, onward)         Start     Ordered   06/01/20 0000  Discharge wound care:       Comments: As above   06/01/20 1523          Follow-up Information    Fleet Contras, MD. Schedule an appointment as soon as possible for a visit in 1 week(s).   Specialty: Internal Medicine Contact information: [address] [place] [address] [phone]        Cain Saupe III, MD. Schedule an appointment as soon as possible for a visit in 1 week(s).   Why: Please call for appointment as soon as possible Contact information: [address] [place] [address] [phone]              No Known Allergies  Consultations:  Orthopedics   Procedures/Studies: MR HAND RIGHT WO CONTRAST  Result Date: 05/30/2020 CLINICAL DATA:  Pain and swelling of the fifth finger and hand. EXAM: MRI OF THE RIGHT HAND WITHOUT CONTRAST TECHNIQUE: Multiplanar, multisequence MR imaging of the right hand was performed. No intravenous contrast was administered. COMPARISON:  Radiographs 05/27/2020 FINDINGS: Severe and diffuse subcutaneous soft tissue swelling/edema/fluid most severely involving the fifth finger. The entire hand is involved but is more notable along the ulnar and dorsal aspects. There is also significant myositis involving the lateral hand musculature. No obvious findings for pyomyositis. I do not see any definite MR findings to suggest septic arthritis or osteomyelitis. Exam is somewhat limited by motion artifact and poor distal fat saturation. The flexor and extensor tendons are intact. Mild  interphalangeal joint degenerative changes and moderate first MCP joint degenerative changes. No findings for erosive arthropathy. Degenerative and cystic changes noted at the wrist. IMPRESSION: 1. Severe and diffuse subcutaneous soft tissue swelling/edema/fluid most severely involving the fifth finger. This is likely severe cellulitis. 2. Myositis involving the lateral hand musculature. No obvious findings for pyomyositis. 3. No definite MR findings for septic arthritis or osteomyelitis. Electronically Signed   By: Rudie Meyer M.D.   On: 05/30/2020 08:01   DG Hand Complete Right  Result Date: 05/27/2020 CLINICAL DATA:  Hit hand against metal object.  Swelling. EXAM: RIGHT HAND - COMPLETE 3+ VIEW COMPARISON:  Frontal, oblique, and lateral views were obtained. FINDINGS: Frontal, oblique, and lateral views were obtained. There is soft tissue swelling, most marked involving the fifth digit. No fracture or dislocation. There is narrowing of the first MCP joint. Other joint spaces appear unremarkable. No erosive changes. IMPRESSION: Soft tissue swelling, most notably involving the fifth digit. No fracture or dislocation. Osteoarthritic change first MCP joint noted. Electronically Signed   By: Bretta Bang III M.D.   On: 05/27/2020 12:19       Subjective: Patient seen and examined at the bedside this morning.  Very eager to go home,  does not want to stay  Discharge Exam: Vitals:   06/01/20 0400 06/01/20 1352  BP: (!) 148/86 (!) 147/86  Pulse: 69 75  Resp: 18 19  Temp: 98.6 F (37 C) 98.4 F (36.9 C)  SpO2: 100% 95%   Vitals:   05/31/20 1400 05/31/20 2040 06/01/20 0400 06/01/20 1352  BP: 130/60 (!) 155/81 (!) 148/86 (!) 147/86  Pulse: 65 82 69 75  Resp: 18 18 18 19   Temp: 98.1 F (36.7 C) 98.2 F (36.8 C) 98.6 F (37 C) 98.4 F (36.9 C)  TempSrc: Oral Oral Oral Oral  SpO2: 100% 100% 100% 95%  Weight:      Height:        General: Pt is alert, awake, not in acute  distress Cardiovascular: RRR, S1/S2 +, no rubs, no gallops Respiratory: CTA bilaterally, no wheezing, no rhonchi Abdominal: Soft, NT, ND, bowel sounds + Extremities: no edema, no cyanosis, right hand wrapped with dressings    The results of significant diagnostics from this hospitalization (including imaging, microbiology, ancillary and laboratory) are listed below for reference.     Microbiology: Recent Results (from the past 240 hour(s))  SARS CORONAVIRUS 2 (TAT 6-24 HRS) Nasopharyngeal Nasopharyngeal Swab     Status: None   Collection Time: 05/30/20 12:23 AM   Specimen: Nasopharyngeal Swab  Result Value Ref Range Status   SARS Coronavirus 2 NEGATIVE NEGATIVE Final    Comment: (NOTE) SARS-CoV-2 target nucleic acids are NOT DETECTED.  The SARS-CoV-2 RNA is generally detectable in upper and lower respiratory specimens during the acute phase of infection. Negative results do not preclude SARS-CoV-2 infection, do not rule out co-infections with other pathogens, and should not be used as the sole basis for treatment or other patient management decisions. Negative results must be combined with clinical observations, patient history, and epidemiological information. The expected result is Negative.  Fact Sheet for Patients: 06/01/20  Fact Sheet for Healthcare Providers: HairSlick.no  This test is not yet approved or cleared by the [place] FDA and  has been authorized for detection and/or diagnosis of SARS-CoV-2 by FDA under an Emergency Use Authorization (EUA). This EUA will remain  in effect (meaning this test can be used) for the duration of the COVID-19 declaration under Se ction 564(b)(1) of the Act, 21 U.S.C. section 360bbb-3(b)(1), unless the authorization is terminated or revoked sooner.  Performed at [facility], [address] [place], [place]   Surgical pcr screen     Status: None    Collection Time: 05/30/20  5:33 PM   Specimen: Nasal Mucosa; Nasal Swab  Result Value Ref Range Status   MRSA, PCR NEGATIVE NEGATIVE Final   Staphylococcus aureus NEGATIVE NEGATIVE Final    Comment: (NOTE) The Xpert SA Assay (FDA approved for NASAL specimens in patients 28 years of age and older), is one component of a comprehensive surveillance program. It is not intended to diagnose infection nor to guide or monitor treatment. Performed at [facility], [address] [place], [place]   Aerobic/Anaerobic Culture w Gram Stain (surgical/deep wound)     Status: None (Preliminary result)   Collection Time: 05/30/20  8:18 PM   Specimen: Other Source; Tissue  Result Value Ref Range Status   Specimen Description FINGER  Final   Special Requests RIGHT SAMPLE A  Final   Gram Stain   Final    FEW WBC PRESENT, PREDOMINANTLY PMN RARE GRAM POSITIVE COCCI Performed at [facility],  [address] [place], [place] [address]    Culture   Final    MODERATE STAPHYLOCOCCUS AUREUS SUSCEPTIBILITIES TO FOLLOW NO ANAEROBES ISOLATED; CULTURE IN PROGRESS FOR 5 DAYS    Report Status PENDING  Incomplete  Aerobic/Anaerobic Culture w Gram Stain (surgical/deep wound)     Status: None (Preliminary result)   Collection Time: 05/30/20  8:22 PM   Specimen: Other Source; Tissue  Result Value Ref Range Status   Specimen Description FINGER  Final   Special Requests RIGHT SAMPLE B  Final   Gram Stain   Final    ABUNDANT WBC PRESENT, PREDOMINANTLY PMN FEW GRAM POSITIVE COCCI Performed at [facility], [address] [place], [place] [address]    Culture   Final    MODERATE STAPHYLOCOCCUS AUREUS SUSCEPTIBILITIES TO FOLLOW NO ANAEROBES ISOLATED; CULTURE IN PROGRESS FOR 5 DAYS    Report Status PENDING  Incomplete     Labs: BNP (last 3 results) No results for input(s): BNP in the last 8760 hours. Basic Metabolic Panel: Recent Labs  Lab 05/29/20 1933  NA 139  K 3.7   CL 107  CO2 24  GLUCOSE 113*  BUN 15  CREATININE 1.05  CALCIUM 8.9   Liver Function Tests: Recent Labs  Lab 05/29/20 1933  AST 32  ALT 30  ALKPHOS 77  BILITOT 0.8  PROT 7.1  ALBUMIN 3.5   No results for input(s): LIPASE, AMYLASE in the last 168 hours. No results for input(s): AMMONIA in the last 168 hours. CBC: Recent Labs  Lab 05/29/20 1933 05/31/20 0135  WBC 11.5* 13.3*  NEUTROABS 8.7* 12.2*  HGB 12.6* 12.3*  HCT 38.8* 37.5*  MCV 95.6 94.7  PLT 339 329   Cardiac Enzymes: No results for input(s): CKTOTAL, CKMB, CKMBINDEX, TROPONINI in the last 168 hours. BNP: Invalid input(s): POCBNP CBG: No results for input(s): GLUCAP in the last 168 hours. D-Dimer No results for input(s): DDIMER in the last 72 hours. Hgb A1c No results for input(s): HGBA1C in the last 72 hours. Lipid Profile No results for input(s): CHOL, HDL, LDLCALC, TRIG, CHOLHDL, LDLDIRECT in the last 72 hours. Thyroid function studies No results for input(s): TSH, T4TOTAL, T3FREE, THYROIDAB in the last 72 hours.  Invalid input(s): FREET3 Anemia work up No results for input(s): VITAMINB12, FOLATE, FERRITIN, TIBC, IRON, RETICCTPCT in the last 72 hours. Urinalysis No results found for: COLORURINE, APPEARANCEUR, LABSPEC, PHURINE, GLUCOSEU, HGBUR, BILIRUBINUR, KETONESUR, PROTEINUR, UROBILINOGEN, NITRITE, LEUKOCYTESUR Sepsis Labs Invalid input(s): PROCALCITONIN,  WBC,  LACTICIDVEN Microbiology Recent Results (from the past 240 hour(s))  SARS CORONAVIRUS 2 (TAT 6-24 HRS) Nasopharyngeal Nasopharyngeal Swab     Status: None   Collection Time: 05/30/20 12:23 AM   Specimen: Nasopharyngeal Swab  Result Value Ref Range Status   SARS Coronavirus 2 NEGATIVE NEGATIVE Final    Comment: (NOTE) SARS-CoV-2 target nucleic acids are NOT DETECTED.  The SARS-CoV-2 RNA is generally detectable in upper and lower respiratory specimens during the acute phase of infection. Negative results do not preclude SARS-CoV-2  infection, do not rule out co-infections with other pathogens, and should not be used as the sole basis for treatment or other patient management decisions. Negative results must be combined with clinical observations, patient history, and epidemiological information. The expected result is Negative.  Fact Sheet for Patients: HairSlick.nohttps://www.fda.gov/media/138098/download  Fact Sheet for Healthcare Providers: quierodirigir.comhttps://www.fda.gov/media/138095/download  This test is not yet approved or cleared by the [place] FDA and  has been authorized for detection and/or diagnosis of SARS-CoV-2  by FDA under an Emergency Use Authorization (EUA). This EUA will remain  in effect (meaning this test can be used) for the duration of the COVID-19 declaration under Se ction 564(b)(1) of the Act, 21 U.S.C. section 360bbb-3(b)(1), unless the authorization is terminated or revoked sooner.  Performed at [facility], [address], [place], [place] [address]   Surgical pcr screen     Status: None   Collection Time: 05/30/20  5:33 PM   Specimen: Nasal Mucosa; Nasal Swab  Result Value Ref Range Status   MRSA, PCR NEGATIVE NEGATIVE Final   Staphylococcus aureus NEGATIVE NEGATIVE Final    Comment: (NOTE) The Xpert SA Assay (FDA approved for NASAL specimens in patients 35 years of age and older), is one component of a comprehensive surveillance program. It is not intended to diagnose infection nor to guide or monitor treatment. Performed at [facility], [address], [place], [place] [address]   Aerobic/Anaerobic Culture w Gram Stain (surgical/deep wound)     Status: None (Preliminary result)   Collection Time: 05/30/20  8:18 PM   Specimen: Other Source; Tissue  Result Value Ref Range Status   Specimen Description FINGER  Final   Special Requests RIGHT SAMPLE A  Final   Gram Stain   Final    FEW WBC PRESENT, PREDOMINANTLY PMN RARE GRAM POSITIVE COCCI Performed at [facility]  Lab, [address], [place], [place] [address]    Culture   Final    MODERATE STAPHYLOCOCCUS AUREUS SUSCEPTIBILITIES TO FOLLOW NO ANAEROBES ISOLATED; CULTURE IN PROGRESS FOR 5 DAYS    Report Status PENDING  Incomplete  Aerobic/Anaerobic Culture w Gram Stain (surgical/deep wound)     Status: None (Preliminary result)   Collection Time: 05/30/20  8:22 PM   Specimen: Other Source; Tissue  Result Value Ref Range Status   Specimen Description FINGER  Final   Special Requests RIGHT SAMPLE B  Final   Gram Stain   Final    ABUNDANT WBC PRESENT, PREDOMINANTLY PMN FEW GRAM POSITIVE COCCI Performed at [facility], [address] [place], [place] [address]    Culture   Final    MODERATE STAPHYLOCOCCUS AUREUS SUSCEPTIBILITIES TO FOLLOW NO ANAEROBES ISOLATED; CULTURE IN PROGRESS FOR 5 DAYS    Report Status PENDING  Incomplete    Please note: You were cared for by a hospitalist during your hospital stay. Once you are discharged, your primary care physician will handle any further medical issues. Please note that NO REFILLS for any discharge medications will be authorized once you are discharged, as it is imperative that you return to your primary care physician (or establish a relationship with a primary care physician if you do not have one) for your post hospital discharge needs so that they can reassess your need for medications and monitor your lab values.    Time coordinating discharge: 40 minutes  SIGNED:   Burnadette Pop, MD  [facility] 06/01/2020, 3:25 PM Pager [phone]  If 7PM-7AM, please contact night-coverage [email or web address] Password [email or web address]

## 2020-06-01 NOTE — Progress Notes (Signed)
PROGRESS NOTE    Jonathan Carney  SHF:[id number] DOB: March 28, 1960 DOA: 05/29/2020 PCP: Fleet Contras, MD   Chief Complain: Right hand swelling  Brief Narrative: Patient is a 3 gentleman with history of hypertension, hyperlipidemia, tobacco abuse who presents to the emergency room with complaints of worsening of right arm swelling.  As per the report, he hurt his hand on the railing at his home when he was with his dog.  The little finger on the right hand started swelling progressively and he went to urgent care and was given antibiotics, x-ray at that time did not show any fracture.  Patient was seen at [facility] and was directed to the emergency department.  No fever or chills.  He had mild leukocytosis on presentation.  Hand surgery consulted and he uderwent  irrigation/debridement of this right small finger on 05/31/20. PT consulted for her therapy.  Orthopedics recommending continue inpatient care for IV antibiotics and wound care  Assessment & Plan:   Active Problems:   Cellulitis of hand   Right hand cellulitis: Underwent irrigation/debridement of a small right finger/ irrigation debridement of dorsal small right finger abscess/flexor tenosynovectomy on 3/254/22 .   We will follow up culture report. MRI had shown severe and diffuse subcutaneous soft tissue swelling/edema/fluid most severely involving the fifth finger suggesting severe cellulitis,myositis involving the lateral hand musculature,no definite MR findings for septic arthritis or osteomyelitis. PT consulted for continuing wound care/hydrotherapy.  Will continue IV antibiotics.  Most likely he will need home health RN at home for wound dressing He has mild leucocytosis  Hypertension: No history of hypertension in the past.  Patient was persistently hypertensive. Started on amlodipine with improvement of BP.  Hyperlipidemia: On statin  Tobacco abuse: Advised quitting  Normocytic anemia: Currently hemoglobin  stable.  Vitamin D deficiency: Started on supplementation.         DVT prophylaxis: Lovenox Code Status: Full Family Communication: None at bedside Status is: Inpatient  Remains inpatient appropriate because:Inpatient level of care appropriate due to severity of illness   Dispo: The patient is from: Home              Anticipated d/c is to: Home              Patient currently is not medically stable to d/c.   Difficult to place patient No     Consultants: Orthopedics  Procedures:None yet  Antimicrobials:  Anti-infectives (From admission, onward)   Start     Dose/Rate Route Frequency Ordered Stop   05/31/20 1800  vancomycin (VANCOREADY) IVPB 1500 mg/300 mL        1,500 mg 150 mL/hr over 120 Minutes Intravenous Every 12 hours 05/31/20 1607     05/30/20 1030  vancomycin (VANCOREADY) IVPB 1000 mg/200 mL  Status:  Discontinued        1,000 mg 200 mL/hr over 60 Minutes Intravenous Every 12 hours 05/29/20 2146 05/31/20 1607   05/29/20 2330  ceFEPIme (MAXIPIME) 2 g in sodium chloride 0.9 % 100 mL IVPB        2 g 200 mL/hr over 30 Minutes Intravenous Every 8 hours 05/29/20 2226     05/29/20 2230  vancomycin (VANCOREADY) IVPB 1000 mg/200 mL  Status:  Discontinued        1,000 mg 200 mL/hr over 60 Minutes Intravenous  Once 05/29/20 2226 05/29/20 2231   05/29/20 2100  vancomycin (VANCOREADY) IVPB 1500 mg/300 mL        1,500 mg 150 mL/hr over 120 Minutes  Intravenous  Once 05/29/20 2041 05/29/20 2331   05/29/20 2045  cefTRIAXone (ROCEPHIN) 2 g in sodium chloride 0.9 % 100 mL IVPB        2 g 200 mL/hr over 30 Minutes Intravenous  Once 05/29/20 2032 05/29/20 2154      Subjective:  Patient seen and examined the bedside this morning.  Hemodynamically stable.  Looks more comfortable today.  Very eager to go home.  Objective: Vitals:   05/31/20 1347 05/31/20 1400 05/31/20 2040 06/01/20 0400  BP: (!) 177/90 130/60 (!) 155/81 (!) 148/86  Pulse: 83 65 82 69  Resp:  18 18 18    Temp: 97.9 F (36.6 C) 98.1 F (36.7 C) 98.2 F (36.8 C) 98.6 F (37 C)  TempSrc:  Oral Oral Oral  SpO2: 99% 100% 100% 100%  Weight:      Height:        Intake/Output Summary (Last 24 hours) at 06/01/2020 0824 Last data filed at 05/31/2020 2109 Gross per 24 hour  Intake 940.12 ml  Output --  Net 940.12 ml   Filed Weights   05/30/20 1500  Weight: 114.4 kg    Examination:  General exam: Overall comfortable, not in distress HEENT: PERRL Respiratory system:  no wheezes or crackles  Cardiovascular system: S1 & S2 heard, RRR.  Gastrointestinal system: Abdomen is nondistended, soft and nontender. Central nervous system: Alert and oriented Extremities: dressing on the right hand, no clubbing ,no cyanosis Skin: No rashes, no ulcers,no icterus   Data Reviewed: I have personally reviewed following labs and imaging studies  CBC: Recent Labs  Lab 05/29/20 1933 05/31/20 0135  WBC 11.5* 13.3*  NEUTROABS 8.7* 12.2*  HGB 12.6* 12.3*  HCT 38.8* 37.5*  MCV 95.6 94.7  PLT 339 329   Basic Metabolic Panel: Recent Labs  Lab 05/29/20 1933  NA 139  K 3.7  CL 107  CO2 24  GLUCOSE 113*  BUN 15  CREATININE 1.05  CALCIUM 8.9   GFR: Estimated Creatinine Clearance: 100.1 mL/min (by C-G formula based on SCr of 1.05 mg/dL). Liver Function Tests: Recent Labs  Lab 05/29/20 1933  AST 32  ALT 30  ALKPHOS 77  BILITOT 0.8  PROT 7.1  ALBUMIN 3.5   No results for input(s): LIPASE, AMYLASE in the last 168 hours. No results for input(s): AMMONIA in the last 168 hours. Coagulation Profile: No results for input(s): INR, PROTIME in the last 168 hours. Cardiac Enzymes: No results for input(s): CKTOTAL, CKMB, CKMBINDEX, TROPONINI in the last 168 hours. BNP (last 3 results) No results for input(s): PROBNP in the last 8760 hours. HbA1C: No results for input(s): HGBA1C in the last 72 hours. CBG: No results for input(s): GLUCAP in the last 168 hours. Lipid Profile: No results for  input(s): CHOL, HDL, LDLCALC, TRIG, CHOLHDL, LDLDIRECT in the last 72 hours. Thyroid Function Tests: No results for input(s): TSH, T4TOTAL, FREET4, T3FREE, THYROIDAB in the last 72 hours. Anemia Panel: No results for input(s): VITAMINB12, FOLATE, FERRITIN, TIBC, IRON, RETICCTPCT in the last 72 hours. Sepsis Labs: Recent Labs  Lab 05/29/20 1933  LATICACIDVEN 0.9    Recent Results (from the past 240 hour(s))  SARS CORONAVIRUS 2 (TAT 6-24 HRS) Nasopharyngeal Nasopharyngeal Swab     Status: None   Collection Time: 05/30/20 12:23 AM   Specimen: Nasopharyngeal Swab  Result Value Ref Range Status   SARS Coronavirus 2 NEGATIVE NEGATIVE Final    Comment: (NOTE) SARS-CoV-2 target nucleic acids are NOT DETECTED.  The SARS-CoV-2 RNA is  generally detectable in upper and lower respiratory specimens during the acute phase of infection. Negative results do not preclude SARS-CoV-2 infection, do not rule out co-infections with other pathogens, and should not be used as the sole basis for treatment or other patient management decisions. Negative results must be combined with clinical observations, patient history, and epidemiological information. The expected result is Negative.  Fact Sheet for Patients: HairSlick.no  Fact Sheet for Healthcare Providers: quierodirigir.com  This test is not yet approved or cleared by the [place] FDA and  has been authorized for detection and/or diagnosis of SARS-CoV-2 by FDA under an Emergency Use Authorization (EUA). This EUA will remain  in effect (meaning this test can be used) for the duration of the COVID-19 declaration under Se ction 564(b)(1) of the Act, 21 U.S.C. section 360bbb-3(b)(1), unless the authorization is terminated or revoked sooner.  Performed at [facility], [address], [place], [place] [address]   Surgical pcr screen     Status: None   Collection Time: 05/30/20   5:33 PM   Specimen: Nasal Mucosa; Nasal Swab  Result Value Ref Range Status   MRSA, PCR NEGATIVE NEGATIVE Final   Staphylococcus aureus NEGATIVE NEGATIVE Final    Comment: (NOTE) The Xpert SA Assay (FDA approved for NASAL specimens in patients 63 years of age and older), is one component of a comprehensive surveillance program. It is not intended to diagnose infection nor to guide or monitor treatment. Performed at [facility], [address], [place], [place] [address]   Aerobic/Anaerobic Culture w Gram Stain (surgical/deep wound)     Status: None (Preliminary result)   Collection Time: 05/30/20  8:18 PM   Specimen: Other Source; Tissue  Result Value Ref Range Status   Specimen Description FINGER  Final   Special Requests RIGHT SAMPLE A  Final   Gram Stain   Final    FEW WBC PRESENT, PREDOMINANTLY PMN RARE GRAM POSITIVE COCCI    Culture   Final    TOO YOUNG TO READ Performed at [facility], [address], [place], [place] [address]    Report Status PENDING  Incomplete  Aerobic/Anaerobic Culture w Gram Stain (surgical/deep wound)     Status: None (Preliminary result)   Collection Time: 05/30/20  8:22 PM   Specimen: Other Source; Tissue  Result Value Ref Range Status   Specimen Description FINGER  Final   Special Requests RIGHT SAMPLE B  Final   Gram Stain   Final    ABUNDANT WBC PRESENT, PREDOMINANTLY PMN FEW GRAM POSITIVE COCCI    Culture   Final    TOO YOUNG TO READ Performed at [facility], [address] [place], [place] [address]    Report Status PENDING  Incomplete         Radiology Studies: No results found.      Scheduled Meds: . amLODipine  10 mg Oral Daily  . atorvastatin  40 mg Oral Daily  . chlorhexidine  15 mL Mouth/Throat Once  . Vitamin D (Ergocalciferol)  50,000 Units Oral Q7 days   Continuous Infusions: . ceFEPime (MAXIPIME) IV 2 g (05/31/20 2245)  . lactated ringers    . vancomycin 1,500 mg (06/01/20 0539)      LOS: 3 days    Time spent: 25 mins.More than 50% of that time was spent in counseling and/or coordination of care.      Burnadette Pop, MD [facility] P3/26/2022, 8:24 AM

## 2020-06-01 NOTE — Progress Notes (Signed)
Pt is requesting to be discharged today. Notify attending Dr. Renford Dills, MD stated the pt needs to be clear from orthopedics MD. [facility] waiting for orders.

## 2020-06-01 NOTE — Progress Notes (Signed)
Pt has d/c instruction, answer all question about dressing and medication. IV d/c and intact. Pt has all belonging and dressing supplies given.

## 2020-06-01 NOTE — Progress Notes (Signed)
Physical Therapy Wound Treatment Patient Details  Name: Jonathan Carney MRN: [id number] Date of Birth: 01-03-1961  Today's Date: 06/01/2020 Time: 1010-1040 Time Calculation (min): 30 min  Subjective  Subjective Assessment Subjective: Pt eager to go home Date of Onset:  (unknown) Prior Treatments: I and D 3/24  Pain Score:  5/10  Wound Assessment  Wound / Incision (Open or Dehisced) 05/31/20 Incision - Open Hand Anterior;Right;Posterior 5th finger abscess with dorsal and palmar drainage wound access (Active)  Dressing Type Compression wrap;Impregnated gauze (bismuth);Normal saline moist dressing;Moist to dry 06/01/20 1055  Dressing Changed Changed 06/01/20 1055  Dressing Status Clean;Dry;Intact 06/01/20 1055  Dressing Change Frequency Daily 06/01/20 1055  Site / Wound Assessment Purple;Red;Dusky 06/01/20 1055  % Wound base Red or Granulating 95% 05/31/20 1239  % Wound base Yellow/Fibrinous Exudate 5% 05/31/20 1239  % Wound base Black/Eschar 0% 05/31/20 1239  % Wound base Other/Granulation Tissue (Comment) 0% 05/31/20 1239  Peri-wound Assessment Intact;Induration 06/01/20 1055  Wound Length (cm) 7 cm 05/31/20 1239  Wound Width (cm) 5 cm 05/31/20 1239  Wound Depth (cm) 0.5 cm 05/31/20 1239  Wound Volume (cm^3) 17.5 cm^3 05/31/20 1239  Wound Surface Area (cm^2) 35 cm^2 05/31/20 1239  Margins Attached edges (approximated) 05/31/20 1239  Closure Surface sutures 06/01/20 1055  Drainage Amount Minimal 06/01/20 1055  Drainage Description Serous;Sanguineous 06/01/20 1055  Treatment Hydrotherapy (Pulse lavage);Packing (Saline gauze) 06/01/20 1055      Hydrotherapy Pulsed lavage therapy - wound location: 5th right finger and palmer (hypothenar area) and dorsal drain site. Pulsed Lavage with Suction (psi): 4 psi Pulsed Lavage with Suction - Normal Saline Used: 1000 mL Pulsed Lavage Tip: Tip with splash shield    Wound Assessment and Plan  Wound Therapy -  Assess/Plan/Recommendations Wound Therapy - Clinical Statement: Initiated education regarding dressing changes and provided materials. Pt will benefit from Pulsed lavage for cleansing the wound for main purpose in decreasing the bio-burden at the wounds and wicking to draw any infection from the wound. Wound Therapy - Functional Problem List: decreased Mobility of R hand Factors Delaying/Impairing Wound Healing: Infection - systemic/local,Tobacco use Hydrotherapy Plan: Debridement,Dressing change,Pulsatile lavage with suction,Patient/family education Wound Therapy - Frequency: 6X / week Wound Therapy - Follow Up Recommendations: dressing changes by family/patient  Wound Therapy Goals- Improve the function of patient's integumentary system by progressing the wound(s) through the phases of wound healing (inflammation - proliferation - remodeling) by: Wound Therapy Goals - Improve the function of patient's integumentary system by progressing the wound(s) through the phases of wound healing by: Decrease Necrotic Tissue to: 0% Decrease Necrotic Tissue - Progress: Progressing toward goal Increase Granulation Tissue to: 100% Increase Granulation Tissue - Progress: Progressing toward goal Patient/Family will be able to : manage simple dressing change. Patient/Family Instruction Goal - Progress: Progressing toward goal Goals/treatment plan/discharge plan were made with and agreed upon by patient/family: Yes Time For Goal Achievement: 7 days Wound Therapy - Potential for Goals: Good  Goals will be updated until maximal potential achieved or discharge criteria met.  Discharge criteria: when goals achieved, discharge from hospital, MD decision/surgical intervention, no progress towards goals, refusal/missing three consecutive treatments without notification or medical reason.  GP     Charges PT Wound Care Charges $Wound Debridement up to 20 cm: < or equal to 20 cm $PT Hydrotherapy Dressing: 1  dressing $PT PLS Gun and Tip: 1 Supply $PT Hydrotherapy Visit: 1 Visit       Jonathan Carney, PT, DPT Acute Rehabilitation Services Pager [phone] Office [phone]  Deno Etienne 06/01/2020, 10:57 AM

## 2020-06-04 LAB — AEROBIC/ANAEROBIC CULTURE W GRAM STAIN (SURGICAL/DEEP WOUND)

## 2020-06-14 ENCOUNTER — Other Ambulatory Visit: Payer: Self-pay

## 2020-06-14 ENCOUNTER — Encounter (HOSPITAL_BASED_OUTPATIENT_CLINIC_OR_DEPARTMENT_OTHER): Payer: Self-pay | Admitting: Orthopaedic Surgery

## 2020-06-17 ENCOUNTER — Ambulatory Visit (HOSPITAL_BASED_OUTPATIENT_CLINIC_OR_DEPARTMENT_OTHER): Payer: Medicaid Other | Admitting: Anesthesiology

## 2020-06-17 ENCOUNTER — Encounter (HOSPITAL_BASED_OUTPATIENT_CLINIC_OR_DEPARTMENT_OTHER)
Admission: RE | Disposition: A | Discharge status: Home / Self Care | Payer: Self-pay | Source: Home / Self Care | Attending: Orthopaedic Surgery

## 2020-06-17 ENCOUNTER — Encounter (HOSPITAL_BASED_OUTPATIENT_CLINIC_OR_DEPARTMENT_OTHER)
Admission: RE | Admit: 2020-06-17 | Discharge: 2020-06-17 | Disposition: A | Discharge status: Home / Self Care | Payer: Medicaid Other | Source: Ambulatory Visit | Attending: Orthopaedic Surgery | Admitting: Orthopaedic Surgery

## 2020-06-17 ENCOUNTER — Other Ambulatory Visit: Payer: Self-pay

## 2020-06-17 ENCOUNTER — Ambulatory Visit (HOSPITAL_BASED_OUTPATIENT_CLINIC_OR_DEPARTMENT_OTHER)
Admission: RE | Admit: 2020-06-17 | Discharge: 2020-06-17 | Disposition: A | Discharge status: Home / Self Care | Payer: Medicaid Other | Attending: Orthopaedic Surgery | Admitting: Orthopaedic Surgery

## 2020-06-17 ENCOUNTER — Encounter (HOSPITAL_BASED_OUTPATIENT_CLINIC_OR_DEPARTMENT_OTHER): Payer: Self-pay | Admitting: Orthopaedic Surgery

## 2020-06-17 DIAGNOSIS — F17210 Nicotine dependence, cigarettes, uncomplicated: Secondary | ICD-10-CM | POA: Diagnosis not present

## 2020-06-17 DIAGNOSIS — L089 Local infection of the skin and subcutaneous tissue, unspecified: Secondary | ICD-10-CM | POA: Diagnosis present

## 2020-06-17 DIAGNOSIS — Z20822 Contact with and (suspected) exposure to covid-19: Secondary | ICD-10-CM | POA: Insufficient documentation

## 2020-06-17 HISTORY — DX: Nicotine dependence, unspecified, uncomplicated: F17.200

## 2020-06-17 HISTORY — PX: INCISION AND DRAINAGE OF WOUND: SHX1803

## 2020-06-17 HISTORY — DX: Gastro-esophageal reflux disease without esophagitis: K21.9

## 2020-06-17 LAB — SURGICAL PCR SCREEN
MRSA, PCR: NEGATIVE
Staphylococcus aureus: NEGATIVE

## 2020-06-17 LAB — SARS CORONAVIRUS 2 BY RT PCR (HOSPITAL ORDER, PERFORMED IN ~~LOC~~ HOSPITAL LAB): SARS Coronavirus 2: NEGATIVE

## 2020-06-17 SURGERY — IRRIGATION AND DEBRIDEMENT WOUND
Anesthesia: Monitor Anesthesia Care | Site: Finger | Laterality: Right

## 2020-06-17 MED ORDER — OXYCODONE HCL 5 MG PO TABS: 5.0000 mg | ORAL_TABLET | Freq: Once | ORAL | Status: DC | PRN

## 2020-06-17 MED ORDER — HYDROMORPHONE HCL 1 MG/ML IJ SOLN
INTRAMUSCULAR | Status: AC
  Filled 2020-06-17: qty 0.5

## 2020-06-17 MED ORDER — POVIDONE-IODINE 10 % EX SWAB
2.0000 "application " | Freq: Once | CUTANEOUS | Status: AC
  Administered 2020-06-17: 2 via TOPICAL

## 2020-06-17 MED ORDER — HYDROMORPHONE HCL 1 MG/ML IJ SOLN
0.2500 mg | INTRAMUSCULAR | Status: DC | PRN
  Administered 2020-06-17 (×2): 0.5 mg via INTRAVENOUS

## 2020-06-17 MED ORDER — FENTANYL CITRATE (PF) 100 MCG/2ML IJ SOLN
INTRAMUSCULAR | Status: AC
  Filled 2020-06-17: qty 2

## 2020-06-17 MED ORDER — ONDANSETRON HCL 4 MG/2ML IJ SOLN
INTRAMUSCULAR | Status: DC | PRN
  Administered 2020-06-17: 4 mg via INTRAVENOUS

## 2020-06-17 MED ORDER — PROPOFOL 10 MG/ML IV BOLUS
INTRAVENOUS | Status: DC | PRN
  Administered 2020-06-17: 50 mg via INTRAVENOUS
  Administered 2020-06-17: 250 mg via INTRAVENOUS

## 2020-06-17 MED ORDER — CHLORHEXIDINE GLUCONATE 4 % EX LIQD: 60.0000 mL | Freq: Once | CUTANEOUS | Status: DC

## 2020-06-17 MED ORDER — LACTATED RINGERS IV SOLN: INTRAVENOUS | Status: DC

## 2020-06-17 MED ORDER — HYDROCODONE-ACETAMINOPHEN 5-325 MG PO TABS: 1.0000 | ORAL_TABLET | ORAL | 0 refills | Status: DC | PRN

## 2020-06-17 MED ORDER — OXYCODONE HCL 5 MG/5ML PO SOLN: 5.0000 mg | Freq: Once | ORAL | Status: DC | PRN

## 2020-06-17 MED ORDER — PROMETHAZINE HCL 25 MG/ML IJ SOLN
6.2500 mg | INTRAMUSCULAR | Status: DC | PRN
Start: 2020-06-17 — End: 2020-06-17

## 2020-06-17 MED ORDER — MIDAZOLAM HCL 2 MG/2ML IJ SOLN
INTRAMUSCULAR | Status: AC
  Filled 2020-06-17: qty 2

## 2020-06-17 MED ORDER — FENTANYL CITRATE (PF) 100 MCG/2ML IJ SOLN
INTRAMUSCULAR | Status: DC | PRN
  Administered 2020-06-17: 50 ug via INTRAVENOUS
  Administered 2020-06-17 (×2): 25 ug via INTRAVENOUS

## 2020-06-17 MED ORDER — MIDAZOLAM HCL 5 MG/5ML IJ SOLN
INTRAMUSCULAR | Status: DC | PRN
  Administered 2020-06-17: 2 mg via INTRAVENOUS

## 2020-06-17 MED ORDER — VANCOMYCIN HCL IN DEXTROSE 1-5 GM/200ML-% IV SOLN
1000.0000 mg | INTRAVENOUS | Status: AC
  Administered 2020-06-17: 1000 mg via INTRAVENOUS

## 2020-06-17 MED ORDER — VANCOMYCIN HCL IN DEXTROSE 1-5 GM/200ML-% IV SOLN
INTRAVENOUS | Status: AC
  Filled 2020-06-17: qty 200

## 2020-06-17 MED ORDER — LIDOCAINE 2% (20 MG/ML) 5 ML SYRINGE
INTRAMUSCULAR | Status: AC
  Filled 2020-06-17: qty 5

## 2020-06-17 SURGICAL SUPPLY — 56 items
BLADE SURG 15 STRL LF DISP TIS (BLADE) ×2 IMPLANT
BLADE SURG 15 STRL SS (BLADE) ×4
BNDG CMPR 9X4 STRL LF SNTH (GAUZE/BANDAGES/DRESSINGS) ×1
BNDG CONFORM 2 STRL LF (GAUZE/BANDAGES/DRESSINGS) IMPLANT
BNDG ELASTIC 3X5.8 VLCR STR LF (GAUZE/BANDAGES/DRESSINGS) ×2 IMPLANT
BNDG ELASTIC 4X5.8 VLCR STR LF (GAUZE/BANDAGES/DRESSINGS) ×1 IMPLANT
BNDG ESMARK 4X9 LF (GAUZE/BANDAGES/DRESSINGS) ×2 IMPLANT
BNDG GAUZE ELAST 4 BULKY (GAUZE/BANDAGES/DRESSINGS) ×1 IMPLANT
CANISTER SUCT 1200ML W/VALVE (MISCELLANEOUS) ×2 IMPLANT
CORD BIPOLAR FORCEPS 12FT (ELECTRODE) ×2 IMPLANT
COVER BACK TABLE 60X90IN (DRAPES) ×2 IMPLANT
COVER WAND RF STERILE (DRAPES) IMPLANT
CUFF TOURN SGL QUICK 18X4 (TOURNIQUET CUFF) IMPLANT
DECANTER SPIKE VIAL GLASS SM (MISCELLANEOUS) IMPLANT
DRAPE EXTREMITY T 121X128X90 (DISPOSABLE) ×2 IMPLANT
DRAPE IMP U-DRAPE 54X76 (DRAPES) ×2 IMPLANT
DRAPE SURG 17X23 STRL (DRAPES) ×2 IMPLANT
DRSG ADAPTIC 3X8 NADH LF (GAUZE/BANDAGES/DRESSINGS) ×1 IMPLANT
DRSG EMULSION OIL 3X3 NADH (GAUZE/BANDAGES/DRESSINGS) ×1 IMPLANT
GAUZE SPONGE 4X4 12PLY STRL (GAUZE/BANDAGES/DRESSINGS) IMPLANT
GAUZE SPONGE 4X4 12PLY STRL LF (GAUZE/BANDAGES/DRESSINGS) ×1 IMPLANT
GAUZE XEROFORM 1X8 LF (GAUZE/BANDAGES/DRESSINGS) ×2 IMPLANT
GLOVE SRG 8 PF TXTR STRL LF DI (GLOVE) IMPLANT
GLOVE SURG SYN 7.5  E (GLOVE)
GLOVE SURG SYN 7.5 E (GLOVE) IMPLANT
GLOVE SURG SYN 7.5 PF PI (GLOVE) IMPLANT
GLOVE SURG UNDER POLY LF SZ8 (GLOVE)
GOWN STRL REUS W/ TWL LRG LVL3 (GOWN DISPOSABLE) ×2 IMPLANT
GOWN STRL REUS W/TWL LRG LVL3 (GOWN DISPOSABLE) ×4
MATRIX WOUND MESHED 2X2 (Tissue) IMPLANT
NDL HYPO 25X1 1.5 SAFETY (NEEDLE) IMPLANT
NEEDLE HYPO 22GX1.5 SAFETY (NEEDLE) IMPLANT
NEEDLE HYPO 25X1 1.5 SAFETY (NEEDLE) IMPLANT
NS IRRIG 1000ML POUR BTL (IV SOLUTION) ×2 IMPLANT
PACK BASIN DAY SURGERY FS (CUSTOM PROCEDURE TRAY) ×2 IMPLANT
PAD CAST 3X4 CTTN HI CHSV (CAST SUPPLIES) IMPLANT
PAD CAST 4YDX4 CTTN HI CHSV (CAST SUPPLIES) IMPLANT
PADDING CAST ABS 3INX4YD NS (CAST SUPPLIES) ×1
PADDING CAST ABS 4INX4YD NS (CAST SUPPLIES) ×1
PADDING CAST ABS COTTON 3X4 (CAST SUPPLIES) ×1 IMPLANT
PADDING CAST ABS COTTON 4X4 ST (CAST SUPPLIES) ×1 IMPLANT
PADDING CAST COTTON 3X4 STRL (CAST SUPPLIES)
PADDING CAST COTTON 4X4 STRL (CAST SUPPLIES) ×2
SHEET MEDIUM DRAPE 40X70 STRL (DRAPES) IMPLANT
SLEEVE SCD COMPRESS KNEE MED (STOCKING) ×2 IMPLANT
SPLINT FIBERGLASS 3X35 (CAST SUPPLIES) ×1 IMPLANT
STRIP CLOSURE SKIN 1/2X4 (GAUZE/BANDAGES/DRESSINGS) ×2 IMPLANT
SUCTION FRAZIER HANDLE 10FR (MISCELLANEOUS)
SUCTION TUBE FRAZIER 10FR DISP (MISCELLANEOUS) IMPLANT
SUT PROLENE 4 0 PS 2 18 (SUTURE) ×2 IMPLANT
SYR BULB EAR ULCER 3OZ GRN STR (SYRINGE) ×2 IMPLANT
SYR CONTROL 10ML LL (SYRINGE) IMPLANT
TOWEL GREEN STERILE FF (TOWEL DISPOSABLE) ×4 IMPLANT
TUBE CONNECTING 20X1/4 (TUBING) IMPLANT
UNDERPAD 30X36 HEAVY ABSORB (UNDERPADS AND DIAPERS) ×2 IMPLANT
WOUND MATRIX MESHED 2X2 (Tissue) ×1 IMPLANT

## 2020-06-17 NOTE — Anesthesia Procedure Notes (Signed)
Procedure Name: LMA Insertion Date/Time: 06/17/2020 11:32 AM Performed by: Thornell Mule, CRNA Pre-anesthesia Checklist: Patient identified, Emergency Drugs available, Suction available and Patient being monitored Patient Re-evaluated:Patient Re-evaluated prior to induction Oxygen Delivery Method: Circle system utilized Preoxygenation: Pre-oxygenation with 100% oxygen Induction Type: IV induction LMA: LMA inserted LMA Size: 5.0 Number of attempts: 1 Placement Confirmation: positive ETCO2 Tube secured with: Tape Dental Injury: Teeth and Oropharynx as per pre-operative assessment

## 2020-06-17 NOTE — Anesthesia Postprocedure Evaluation (Signed)
Anesthesia Post Note  Patient: Jonathan Carney  Procedure(s) Performed: Right small finger irrigation and debridement, application of integra (Right Finger)     Patient location during evaluation: PACU Anesthesia Type: General Level of consciousness: awake and alert Pain management: pain level controlled Vital Signs Assessment: post-procedure vital signs reviewed and stable Respiratory status: spontaneous breathing, nonlabored ventilation and respiratory function stable Cardiovascular status: blood pressure returned to baseline and stable Postop Assessment: no apparent nausea or vomiting Anesthetic complications: no   No complications documented.  Last Vitals:  Vitals:   06/17/20 1245 06/17/20 1300  BP: 121/75 133/83  Pulse: 77 72  Resp: 20 12  Temp:    SpO2: 96% 99%    Last Pain:  Vitals:   06/17/20 1245  TempSrc:   PainSc: Asleep                 Lowella Curb

## 2020-06-17 NOTE — Transfer of Care (Signed)
Immediate Anesthesia Transfer of Care Note  Patient: Jonathan Carney  Procedure(s) Performed: Right small finger irrigation and debridement, application of integra (Right Finger)  Patient Location: PACU  Anesthesia Type:General  Level of Consciousness: drowsy, patient cooperative and responds to stimulation  Airway & Oxygen Therapy: Patient Spontanous Breathing and Patient connected to face mask oxygen  Post-op Assessment: Report given to RN and Post -op Vital signs reviewed and stable  Post vital signs: Reviewed and stable  Last Vitals:  Vitals Value Taken Time  BP    Temp    Pulse    Resp    SpO2      Last Pain:  Vitals:   06/17/20 1031  TempSrc: Oral  PainSc: 9       Patients Stated Pain Goal: 5 (06/17/20 1031)  Complications: No complications documented.

## 2020-06-17 NOTE — Anesthesia Preprocedure Evaluation (Addendum)
Anesthesia Evaluation  Patient identified by MRN, date of birth, ID band Patient awake    Reviewed: Allergy & Precautions, NPO status , Patient's Chart, lab work & pertinent test results  History of Anesthesia Complications Negative for: history of anesthetic complications  Airway Mallampati: II  TM Distance: >3 FB Neck ROM: Full    Dental  (+) Dental Advisory Given, Chipped, Poor Dentition, Loose, Missing,    Pulmonary Current Smoker,    Pulmonary exam normal breath sounds clear to auscultation       Cardiovascular hypertension, Pt. on medications (-) angina(-) Past MI and (-) CHF Normal cardiovascular exam Rhythm:Regular Rate:Normal     Neuro/Psych Foraminal stenosis of cervical region negative psych ROS   GI/Hepatic Neg liver ROS, GERD  ,  Endo/Other  Morbid obesity  Renal/GU negative Renal ROS     Musculoskeletal  (+) Arthritis ,   Abdominal   Peds  Hematology negative hematology ROS (+)   Anesthesia Other Findings   Reproductive/Obstetrics                             Anesthesia Physical  Anesthesia Plan  ASA: II  Anesthesia Plan: General   Post-op Pain Management:    Induction: Intravenous  PONV Risk Score and Plan: 1 and Treatment may vary due to age or medical condition and Ondansetron  Airway Management Planned: Simple Face Mask  Additional Equipment: None  Intra-op Plan:   Post-operative Plan: Extubation in OR  Informed Consent: I have reviewed the patients History and Physical, chart, labs and discussed the procedure including the risks, benefits and alternatives for the proposed anesthesia with the patient or authorized representative who has indicated his/her understanding and acceptance.     Dental advisory given  Plan Discussed with: CRNA  Anesthesia Plan Comments: (   )       Anesthesia Quick Evaluation

## 2020-06-17 NOTE — H&P (Signed)
ORTHOPAEDIC H&P  PCP:  Fleet Contras, MD  Chief Complaint: Right small finger infection  HPI: Jonathan Carney is a 60 y.o. male who complains of right small finger infection.  He was previously evaluated and treated by me for severe right small finger infection with flexor tenosynovitis.  He underwent surgical debridement of his hand about 2 weeks ago and has been undergoing wound therapy since surgery.  He developed necrosis of skin circumferentially around the digit which was seen in clinic last week.  We discussed treatment options and I did recommend proceeding forward with repeat surgical debridement of his right small finger with likely application of Integra versus skin grafting.  He presents to surgery today for operative management.  Past Medical History:  Diagnosis Date  . Arthritis   . Borderline high cholesterol   . GERD (gastroesophageal reflux disease)    occais, no meds  . Hypertension   . Smoker    Past Surgical History:  Procedure Laterality Date  . ANTERIOR CERVICAL DECOMP/DISCECTOMY FUSION N/A 04/08/2018   Procedure: Cervical Five-Six, Cervical Six-Seven Anterior cervical decompression/discectomy/fusion;  Surgeon: Tia Alert, MD;  Location: [facility] OR;  Service: Neurosurgery;  Laterality: N/A;  anterior  . EYE SURGERY  to straighten it out   Right  . I & D EXTREMITY Right 05/30/2020   Procedure: IRRIGATION AND DEBRIDEMENT OF RIGHT SMALL  FINGER;  Surgeon: Ernest Mallick, MD;  Location: [facility] OR;  Service: Orthopedics;  Laterality: Right;   Social History   Socioeconomic History  . Marital status: Single    Spouse name: Not on file  . Number of children: Not on file  . Years of education: Not on file  . Highest education level: Not on file  Occupational History  . Not on file  Tobacco Use  . Smoking status: Current Every Day Smoker    Packs/day: 0.50    Types: Cigarettes  . Smokeless tobacco: Never Used  Vaping Use  . Vaping Use: Never used   Substance and Sexual Activity  . Alcohol use: No  . Drug use: No  . Sexual activity: Yes    Birth control/protection: None  Other Topics Concern  . Not on file  Social History Narrative  . Not on file   Social Determinants of Health   Financial Resource Strain: Not on file  Food Insecurity: Not on file  Transportation Needs: Not on file  Physical Activity: Not on file  Stress: Not on file  Social Connections: Not on file   Family History  Problem Relation Age of Onset  . Healthy Mother   . Cancer Father    No Known Allergies Prior to Admission medications   Medication Sig Start Date End Date Taking? Authorizing Provider  amLODipine (NORVASC) 10 MG tablet Take 1 tablet (10 mg total) by mouth daily. 06/01/20  Yes Burnadette Pop, MD  atorvastatin (LIPITOR) 40 MG tablet Take 40 mg by mouth daily. 05/06/20  Yes [provider]  oxyCODONE (OXY IR/ROXICODONE) 5 MG immediate release tablet Take 1 tablet (5 mg total) by mouth every 4 (four) hours as needed for moderate pain ((score 4 to 6)). 06/01/20  Yes Burnadette Pop, MD  Vitamin D, Ergocalciferol, (DRISDOL) 1.25 MG (50000 UNIT) CAPS capsule Take 1 capsule (50,000 Units total) by mouth every 7 (seven) days. 06/06/20  Yes Burnadette Pop, MD  methocarbamol (ROBAXIN) 500 MG tablet Take 1 tablet (500 mg total) by mouth every 6 (six) hours as needed for muscle spasms. 04/08/18  Tia Alert, MD   No results found.  Positive ROS: All other systems have been reviewed and were otherwise negative with the exception of those mentioned in the HPI and as above.  Physical Exam: General: Alert, no acute distress Cardiovascular: No edema Respiratory: No cyanosis, no use of accessory musculature Skin: necrotic skin on the R SF Psychiatric: Patient is competent for consent with normal mood and affect  MUSCULOSKELETAL: Examination of the right hand shows full-thickness skin wounds to the volar, ulnar and dorsal aspect of the small  finger. Clinical photos are attached. Along the volar aspects and volar wound, there is exposed flexor tendon. He does not have maintained active flexion and extension of the digit though range of motion is decreased. He still has intact sensation to both radial and ulnar aspects of the fingertip and the fingertip is warm well perfused with brisk capillary refill. No gross purulence is noted within the wound just dead, necrotic skin tissue.  Assessment: Right small finger severe infection with skin loss  Plan: Plan to proceed forward with repeat irrigation debridement of the right small finger.  Also we will plan on either skin grafting or Integra placement for coverage of the flexor tendons.  Risk, benefits and alternatives of the procedure were again discussed with the patient.  These risks include but are not limited to infection, bleeding, damage to surrounding structures including blood vessels and nerves, pain, stiffness, recurrence and need for additional procedures.  Informed consent was obtained the patient's right hand was marked.  Plan for discharge home postoperatively and follow-up with me in approximately 1 week for wound check.    Ernest Mallick, MD [phone]   06/17/2020 11:08 AM

## 2020-06-17 NOTE — Op Note (Signed)
PREOPERATIVE DIAGNOSIS: Right small finger wound following severe infection  POSTOPERATIVE DIAGNOSIS: Same  ATTENDING PHYSICIAN: Gasper Lloyd. Roney Mans, III, MD who was present and scrubbed for the entire case   ASSISTANT SURGEON: None.   ANESTHESIA: General  SURGICAL PROCEDURES:  1. Irrigation and debridement of right small finger wound measuring 2.5 x 3 cm with exposed flexor tendons. 2.  Application of Integra graft to right small finger wound measuring 2.5 x 3 cm  SURGICAL INDICATIONS: Patient is a 60 year old male who is seen evaluated by me in clinic last week.  He had previously had a right severe infection to the small finger and underwent irrigation and debridement in the OR approximately 2 weeks ago.  He subsequently developed full-thickness skin loss and wounds around the palmar dorsal aspect of the right small finger with exposed flexor tendon.  Based on his wound appearance in clinic I did recommend proceeding forward with a repeat irrigation and debridement of his right small finger with application of Integra skin graft substitute to allow for healing of his wound with the attempt to salvage the small finger.  FINDING: Skin loss which was full-thickness measuring 2.5 x 3 cm in size with exposed flexor tendon.  Successful debridement was performed with application of Integra graft.  DESCRIPTION OF PROCEDURE: Patient was identified in the preoperative holding area where the risk benefits and alternatives of procedure were once again discussed with the patient.  These risks include but are not limited to infection, bleeding, damage to surrounding structures including blood vessels and nerves, pain, stiffness, wound breakdown and need for additional procedures.  Informed consent was obtained that time the patient's right hand was marked with a surgical marking pen.  He was then brought to the operative suite where timeout was performed identifying the correct patient operative site.  He was  positioned supine on the operative table with his hand outstretched on a hand table.  He was induced under general anesthesia.  A tourniquet is placed on the upper arm and preoperative antibiotics were administered.  The right hand was then prepped using a Betadine paint and scrub and draped in usual sterile fashion.  The limb was exsanguinated and the tourniquet was inflated.  We began with excisional debridement of the right small finger.  There was full-thickness skin loss and necrosis of some remaining skin throughout the palmar, ulnar and dorsal aspect of the skin over the proximal phalanx.  Thorough excisional debridement was performed removing necrotic skin and soft tissues throughout the wound until healthy wound base was established.  There was exposed FDS and FDP tendon along the palmar aspect of the wound and the ulnar digital nerve was still intact in the midportion of the wound.  The wound was thoroughly irrigated with a liter of saline via bulb syringe.  At this point the 2 inch x 2 inch Integra bilayer was placed on the finger.  It was sutured in place throughout the periphery in a tension-free fashion utilizing 4 Prolene suture.  Excess Integra graft was excised.  This provided full coverage of the skin defect throughout the palmar, ulnar and dorsal aspect of the small finger.  Additionally did provide full coverage over the flexor tendons.  At this point Adaptic was placed over top of the Integra and bulky 4 x 4's were placed over top of the small finger.  A well-padded ulnar gutter splint was placed.  The tourniquet was released and the patient had return of brisk capillary refill to all of his  digits.  He was awoken from his anesthesia and taken the PACU in stable condition.  He tolerated the procedure well and there were no complications.  RADIOGRAPHIC INTERPRETATION: None  ESTIMATED BLOOD LOSS: 10 mL  TOURNIQUET TIME: Approximately 30 minutes  SPECIMENS: None  POSTOPERATIVE PLAN:  The patient will be discharged home and seen back  in the office in approximately 5 to 7 days for wound check.  We will plan on removing the silicone layer of his Integra at approximately 2 to 3 weeks pending his wound progress.  IMPLANTS: Integra bilayer graft

## 2020-06-17 NOTE — Progress Notes (Signed)

## 2020-06-17 NOTE — Discharge Instructions (Signed)
Discharge Instructions  - Keep dressings in place. Do not remove them. - The dressings must stay dry - Take all medication as prescribed. Transition to over the counter pain medication as your pain improves - Keep the hand elevated over the next 48-72 hours to help with pain and swelling - Move all digits not restricted by the dressings regularly to prevent stiffness - Please call to schedule a follow up appointment with Dr. Roney Mans and therapy at [phone] for 5-7 days following surgery - Your pain medication have been sent digitally to your pharmacy   Post Anesthesia Home Care Instructions  Activity: Get plenty of rest for the remainder of the day. A responsible individual must stay with you for 24 hours following the procedure.  For the next 24 hours, DO NOT: -Drive a car -Advertising copywriter -Drink alcoholic beverages -Take any medication unless instructed by your physician -Make any legal decisions or sign important papers.  Meals: Start with liquid foods such as gelatin or soup. Progress to regular foods as tolerated. Avoid greasy, spicy, heavy foods. If nausea and/or vomiting occur, drink only clear liquids until the nausea and/or vomiting subsides. Call your physician if vomiting continues.  Special Instructions/Symptoms: Your throat may feel dry or sore from the anesthesia or the breathing tube placed in your throat during surgery. If this causes discomfort, gargle with warm salt water. The discomfort should disappear within 24 hours.  If you had a scopolamine patch placed behind your ear for the management of post- operative nausea and/or vomiting:  1. The medication in the patch is effective for 72 hours, after which it should be removed.  Wrap patch in a tissue and discard in the trash. Wash hands thoroughly with soap and water. 2. You may remove the patch earlier than 72 hours if you experience unpleasant side effects which may include dry mouth, dizziness or visual  disturbances. 3. Avoid touching the patch. Wash your hands with soap and water after contact with the patch.

## 2020-06-18 ENCOUNTER — Encounter (HOSPITAL_BASED_OUTPATIENT_CLINIC_OR_DEPARTMENT_OTHER): Payer: Self-pay | Admitting: Orthopaedic Surgery

## 2020-09-25 ENCOUNTER — Other Ambulatory Visit: Payer: Self-pay

## 2020-09-25 ENCOUNTER — Encounter (HOSPITAL_COMMUNITY): Payer: Self-pay

## 2020-09-25 ENCOUNTER — Ambulatory Visit (INDEPENDENT_AMBULATORY_CARE_PROVIDER_SITE_OTHER): Payer: Medicaid Other

## 2020-09-25 ENCOUNTER — Ambulatory Visit (HOSPITAL_COMMUNITY)
Admission: EM | Admit: 2020-09-25 | Discharge: 2020-09-25 | Disposition: A | Discharge status: Home / Self Care | Payer: Medicaid Other | Attending: Internal Medicine | Admitting: Internal Medicine

## 2020-09-25 DIAGNOSIS — R0602 Shortness of breath: Secondary | ICD-10-CM | POA: Diagnosis not present

## 2020-09-25 DIAGNOSIS — Z20822 Contact with and (suspected) exposure to covid-19: Secondary | ICD-10-CM | POA: Insufficient documentation

## 2020-09-25 DIAGNOSIS — J069 Acute upper respiratory infection, unspecified: Secondary | ICD-10-CM | POA: Diagnosis present

## 2020-09-25 DIAGNOSIS — R059 Cough, unspecified: Secondary | ICD-10-CM

## 2020-09-25 LAB — SARS CORONAVIRUS 2 (TAT 6-24 HRS): SARS Coronavirus 2: NEGATIVE

## 2020-09-25 MED ORDER — AMOXICILLIN-POT CLAVULANATE 875-125 MG PO TABS: 1.0000 | ORAL_TABLET | Freq: Two times a day (BID) | ORAL | 0 refills | Status: AC

## 2020-09-25 MED ORDER — BENZONATATE 100 MG PO CAPS: 100.0000 mg | ORAL_CAPSULE | Freq: Three times a day (TID) | ORAL | 0 refills | Status: DC | PRN

## 2020-09-25 MED ORDER — PREDNISONE 20 MG PO TABS: 40.0000 mg | ORAL_TABLET | Freq: Every day | ORAL | 0 refills | Status: AC

## 2020-09-25 NOTE — Discharge Instructions (Addendum)
You are being treated for respiratory infection and cough with Augmentin antibiotic, prednisone steroid and benzonatate as needed for cough.  Please take these medications with food.  Please continue to monitor for fevers and treat with Tylenol as needed.  Please go to the hospital if any shortness of breath develops or if symptoms worsen.  Your COVID test is pending.  We will call if it is positive.

## 2020-09-25 NOTE — ED Triage Notes (Signed)
Pt presents with productive cough and congestion X 3 weeks.

## 2020-09-25 NOTE — ED Provider Notes (Signed)
[facility]    CSN: [id number] Arrival date & time: 09/25/20  0813      History   Chief Complaint Chief Complaint  Patient presents with   Cough    HPI Jonathan Carney is a 60 y.o. male.   Patient presents for further evaluation for 3-week history of productive cough and congestion.  Denies any known fevers at home but states that he has "felt feverish".  Denies any sore throat or ear pain.  Denies any shortness of breath or chest pain. Denies any sick contacts. Has taken Mucinex over-the-counter with some relief of symptoms.   Cough  Past Medical History:  Diagnosis Date   Arthritis    Borderline high cholesterol    GERD (gastroesophageal reflux disease)    occais, no meds   Hypertension    Smoker     Patient Active Problem List   Diagnosis Date Noted   Cellulitis of hand 05/29/2020   S/P cervical spinal fusion 04/08/2018    Past Surgical History:  Procedure Laterality Date   ANTERIOR CERVICAL DECOMP/DISCECTOMY FUSION N/A 04/08/2018   Procedure: Cervical Five-Six, Cervical Six-Seven Anterior cervical decompression/discectomy/fusion;  Surgeon: Tia Alert, MD;  Location: [facility] OR;  Service: Neurosurgery;  Laterality: N/A;  anterior   EYE SURGERY  to straighten it out   Right   I & D EXTREMITY Right 05/30/2020   Procedure: IRRIGATION AND DEBRIDEMENT OF RIGHT SMALL  FINGER;  Surgeon: Ernest Mallick, MD;  Location: [facility] OR;  Service: Orthopedics;  Laterality: Right;   INCISION AND DRAINAGE OF WOUND Right 06/17/2020   Procedure: Right small finger irrigation and debridement, application of integra;  Surgeon: Ernest Mallick, MD;  Location: [facility];  Service: Orthopedics;  Laterality: Right;  5TH       Home Medications    Prior to Admission medications   Medication Sig Start Date End Date Taking? Authorizing Provider  amoxicillin-clavulanate (AUGMENTIN) 875-125 MG tablet Take 1 tablet by mouth every 12 (twelve) hours for 10  days. 09/25/20 10/05/20 Yes Lance Muss, FNP  benzonatate (TESSALON) 100 MG capsule Take 1 capsule (100 mg total) by mouth every 8 (eight) hours as needed for cough. 09/25/20  Yes Lance Muss, FNP  predniSONE (DELTASONE) 20 MG tablet Take 2 tablets (40 mg total) by mouth daily for 5 days. 09/25/20 09/30/20 Yes Lance Muss, FNP  amLODipine (NORVASC) 10 MG tablet Take 1 tablet (10 mg total) by mouth daily. 06/01/20   Burnadette Pop, MD  atorvastatin (LIPITOR) 40 MG tablet Take 40 mg by mouth daily. 05/06/20   [provider]  HYDROcodone-acetaminophen (NORCO/VICODIN) 5-325 MG tablet Take 1-2 tablets by mouth every 4 (four) hours as needed for moderate pain. 06/17/20 06/17/21  Cain Saupe III, MD  methocarbamol (ROBAXIN) 500 MG tablet Take 1 tablet (500 mg total) by mouth every 6 (six) hours as needed for muscle spasms. 04/08/18   Tia Alert, MD  oxyCODONE (OXY IR/ROXICODONE) 5 MG immediate release tablet Take 1 tablet (5 mg total) by mouth every 4 (four) hours as needed for moderate pain ((score 4 to 6)). 06/01/20   Burnadette Pop, MD  Vitamin D, Ergocalciferol, (DRISDOL) 1.25 MG (50000 UNIT) CAPS capsule Take 1 capsule (50,000 Units total) by mouth every 7 (seven) days. 06/06/20   Burnadette Pop, MD    Family History Family History  Problem Relation Age of Onset   Healthy Mother    Cancer Father     Social  History Social History   Tobacco Use   Smoking status: Every Day    Packs/day: 0.50    Types: Cigarettes   Smokeless tobacco: Never  Vaping Use   Vaping Use: Never used  Substance Use Topics   Alcohol use: No   Drug use: No     Allergies   Patient has no known allergies.   Review of Systems Review of Systems Per HPI  Physical Exam Triage Vital Signs ED Triage Vitals [09/25/20 0857]  Enc Vitals Group     BP 131/85     Pulse Rate 96     Resp 18     Temp 99.1 F (37.3 C)     Temp Source Oral     SpO2 95 %     Weight      Height      Head  Circumference      Peak Flow      Pain Score 4     Pain Loc      Pain Edu?      Excl. in GC?    No data found.  Updated Vital Signs BP 131/85 (BP Location: Right Arm)   Pulse 96   Temp 99.1 F (37.3 C) (Oral)   Resp 18   SpO2 95%   Visual Acuity Right Eye Distance:   Left Eye Distance:   Bilateral Distance:    Right Eye Near:   Left Eye Near:    Bilateral Near:     Physical Exam Constitutional:      General: He is not in acute distress.    Appearance: Normal appearance.  HENT:     Head: Normocephalic and atraumatic.     Right Ear: Tympanic membrane and ear canal normal.     Left Ear: Tympanic membrane and ear canal normal.     Nose: Mucosal edema and congestion present.     Mouth/Throat:     Mouth: Mucous membranes are moist.     Pharynx: Oropharynx is clear. No posterior oropharyngeal erythema.  Eyes:     Extraocular Movements: Extraocular movements intact.     Conjunctiva/sclera: Conjunctivae normal.     Pupils: Pupils are equal, round, and reactive to light.  Cardiovascular:     Rate and Rhythm: Normal rate and regular rhythm.     Pulses: Normal pulses.     Heart sounds: Normal heart sounds.  Pulmonary:     Effort: Pulmonary effort is normal. No respiratory distress.     Breath sounds: Normal breath sounds. No wheezing or rhonchi.  Abdominal:     General: Abdomen is flat. Bowel sounds are normal.     Palpations: Abdomen is soft.  Musculoskeletal:        General: Normal range of motion.     Cervical back: Normal range of motion.  Skin:    General: Skin is warm and dry.  Neurological:     General: No focal deficit present.     Mental Status: He is alert and oriented to person, place, and time. Mental status is at baseline.  Psychiatric:        Mood and Affect: Mood normal.        Behavior: Behavior normal.     UC Treatments / Results  Labs (all labs ordered are listed, but only abnormal results are displayed) Labs Reviewed  SARS CORONAVIRUS 2  (TAT 6-24 HRS)    EKG   Radiology DG Chest 2 View  Result Date: 09/25/2020 CLINICAL DATA:  Productive cough  x3 weeks with SOB, Current smoker EXAM: CHEST - 2 VIEW COMPARISON:  08/24/2015 FINDINGS: Lungs are clear. Heart size and mediastinal contours are within normal limits. No effusion.  No pneumothorax. Cervical fixation hardware is partially seen. Anterior vertebral endplate spurring at multiple levels in the mid and lower thoracic spine. IMPRESSION: No acute cardiopulmonary disease. Electronically Signed   By: Corlis Leak M.D.   On: 09/25/2020 09:19    Procedures Procedures (including critical care time)  Medications Ordered in UC Medications - No data to display  Initial Impression / Assessment and Plan / UC Course  I have reviewed the triage vital signs and the nursing notes.  Pertinent labs & imaging results that were available during my care of the patient were reviewed by me and considered in my medical decision making (see chart for details).     Chest x-ray was negative for any cardiopulmonary process including pneumonia.  Due to 3-week duration of cough and congestion, will treat respiratory infection with Augmentin antibiotic x10 days and prednisone x5 days to decrease inflammation and help with cough.  Also prescribed benzonatate as needed for cough.  Patient to continue to monitor for fever and treat as needed with Tylenol.  Patient advised to go to the hospital if any shortness of breath develops or if any symptoms worsen.  Patient to follow-up with PCP or urgent care as needed if symptoms do not improve.  COVID-19 viral swab pending.  Discussed strict return precautions. Patient verbalized understanding and is agreeable with plan.  Final Clinical Impressions(s) / UC Diagnoses   Final diagnoses:  Acute upper respiratory infection  Cough  Encounter for laboratory testing for COVID-19 virus     Discharge Instructions      You are being treated for respiratory  infection and cough with Augmentin antibiotic, prednisone steroid and benzonatate as needed for cough.  Please take these medications with food.  Please continue to monitor for fevers and treat with Tylenol as needed.  Please go to the hospital if any shortness of breath develops or if symptoms worsen.  Your COVID test is pending.  We will call if it is positive.     ED Prescriptions     Medication Sig Dispense Auth. Provider   amoxicillin-clavulanate (AUGMENTIN) 875-125 MG tablet Take 1 tablet by mouth every 12 (twelve) hours for 10 days. 20 tablet Lance Muss, FNP   predniSONE (DELTASONE) 20 MG tablet Take 2 tablets (40 mg total) by mouth daily for 5 days. 10 tablet Lance Muss, FNP   benzonatate (TESSALON) 100 MG capsule Take 1 capsule (100 mg total) by mouth every 8 (eight) hours as needed for cough. 21 capsule Lance Muss, FNP      PDMP not reviewed this encounter.   Lance Muss, FNP 09/25/20 [phone]

## 2021-01-27 ENCOUNTER — Encounter (HOSPITAL_COMMUNITY): Payer: Self-pay | Admitting: *Deleted

## 2021-01-27 ENCOUNTER — Other Ambulatory Visit: Payer: Self-pay

## 2021-01-27 ENCOUNTER — Ambulatory Visit (HOSPITAL_COMMUNITY)
Admission: EM | Admit: 2021-01-27 | Discharge: 2021-01-27 | Disposition: A | Discharge status: Home / Self Care | Payer: Medicaid Other | Attending: Family Medicine | Admitting: Family Medicine

## 2021-01-27 ENCOUNTER — Ambulatory Visit (INDEPENDENT_AMBULATORY_CARE_PROVIDER_SITE_OTHER): Payer: Medicaid Other

## 2021-01-27 DIAGNOSIS — R053 Chronic cough: Secondary | ICD-10-CM | POA: Diagnosis not present

## 2021-01-27 DIAGNOSIS — F17210 Nicotine dependence, cigarettes, uncomplicated: Secondary | ICD-10-CM | POA: Diagnosis not present

## 2021-01-27 DIAGNOSIS — I10 Essential (primary) hypertension: Secondary | ICD-10-CM | POA: Diagnosis not present

## 2021-01-27 DIAGNOSIS — R059 Cough, unspecified: Secondary | ICD-10-CM | POA: Diagnosis not present

## 2021-01-27 DIAGNOSIS — R062 Wheezing: Secondary | ICD-10-CM

## 2021-01-27 LAB — POCT URINALYSIS DIPSTICK, ED / UC
Bilirubin Urine: NEGATIVE
Glucose, UA: NEGATIVE mg/dL
Ketones, ur: NEGATIVE mg/dL
Leukocytes,Ua: NEGATIVE
Nitrite: NEGATIVE
Protein, ur: 30 mg/dL — AB
Specific Gravity, Urine: 1.025 (ref 1.005–1.030)
Urobilinogen, UA: 1 mg/dL (ref 0.0–1.0)
pH: 7 (ref 5.0–8.0)

## 2021-01-27 MED ORDER — PROMETHAZINE-DM 6.25-15 MG/5ML PO SYRP: 5.0000 mL | ORAL_SOLUTION | Freq: Four times a day (QID) | ORAL | 0 refills | Status: DC | PRN

## 2021-01-27 MED ORDER — PREDNISONE 20 MG PO TABS
40.0000 mg | ORAL_TABLET | Freq: Every day | ORAL | 0 refills | Status: DC
Start: 2021-01-27 — End: 2022-06-02

## 2021-01-27 MED ORDER — AZITHROMYCIN 250 MG PO TABS: 250.0000 mg | ORAL_TABLET | Freq: Every day | ORAL | 0 refills | Status: DC

## 2021-01-27 NOTE — Discharge Instructions (Signed)
Your blood pressure was noted to be elevated during your visit today. If you are currently taking medication for high blood pressure, please ensure you are taking this as directed. If you do not have a history of high blood pressure and your blood pressure remains persistently elevated, you may need to begin taking a medication at some point. You may return here within the next few days to recheck if unable to see your primary care provider or if you do not have a one.  BP (!) 142/102   Pulse 86   Temp 98.4 F (36.9 C)   Resp 20   SpO2 96%   BP Readings from Last 3 Encounters:  01/27/21 (!) 142/102  09/25/20 131/85  06/17/20 (!) 125/51

## 2021-01-27 NOTE — ED Provider Notes (Signed)
[facility]   [id number] 01/27/21 Arrival Time: 0935  ASSESSMENT & PLAN:  1. Persistent cough for 3 weeks or longer   2. Cigarette smoker   3. Wheezing   4. Elevated blood pressure reading in office with diagnosis of hypertension    Discussed typical duration of viral illnesses. Given duration of symptom in smoker will tx with below. OTC symptom care as needed.  Meds ordered this encounter  Medications   azithromycin (ZITHROMAX) 250 MG tablet    Sig: Take 1 tablet (250 mg total) by mouth daily. Take first 2 tablets together, then 1 every day until finished.    Dispense:  6 tablet    Refill:  0   predniSONE (DELTASONE) 20 MG tablet    Sig: Take 2 tablets (40 mg total) by mouth daily.    Dispense:  10 tablet    Refill:  0   promethazine-dextromethorphan (PROMETHAZINE-DM) 6.25-15 MG/5ML syrup    Sig: Take 5 mLs by mouth 4 (four) times daily as needed for cough.    Dispense:  118 mL    Refill:  0     Follow-up Information     Fleet Contras, MD.   Specialty: Internal Medicine Why: If worsening or failing to improve as anticipated. Contact information: [address] [place] [address] [phone]                  Discharge Instructions      Your blood pressure was noted to be elevated during your visit today. If you are currently taking medication for high blood pressure, please ensure you are taking this as directed. If you do not have a history of high blood pressure and your blood pressure remains persistently elevated, you may need to begin taking a medication at some point. You may return here within the next few days to recheck if unable to see your primary care provider or if you do not have a one.  BP (!) 142/102   Pulse 86   Temp 98.4 F (36.9 C)   Resp 20   SpO2 96%   BP Readings from Last 3 Encounters:  01/27/21 (!) 142/102  09/25/20 131/85  06/17/20 (!) 125/51       Reviewed expectations re: course of current medical  issues. Questions answered. Outlined signs and symptoms indicating need for more acute intervention. Understanding verbalized. After Visit Summary given.   SUBJECTIVE: History from: patient. Jonathan Carney is a 60 y.o. male who reports: persistent cough and chest congestion; smoker; cigarettes. Feels he is wheezing at times. Denies: fever and difficulty breathing. Normal PO intake without n/v/d.  Increased blood pressure noted today. Reports that he is treated for HTN. He reports no chest pain on exertion, no dyspnea on exertion, no swelling of ankles, no orthostatic dizziness or lightheadedness, no orthopnea or paroxysmal nocturnal dyspnea, and no palpitations.  OBJECTIVE:  Vitals:   01/27/21 1100  BP: (!) 142/102  Pulse: 86  Resp: 20  Temp: 98.4 F (36.9 C)  SpO2: 96%    General appearance: alert; no distress Eyes: PERRLA; EOMI; conjunctiva normal HENT: [place]; AT; with mild nasal congestion Neck: supple  Lungs: speaks full sentences without difficulty; unlabored; wheezing bilat CV: RRR Extremities: no edema Skin: warm and dry Neurologic: normal gait Psychological: alert and cooperative; normal mood and affect  Imaging: DG Chest 2 View  Result Date: 01/27/2021 CLINICAL DATA:  60 year old male with history of persistent cough and chest congestion for 1 month. EXAM: CHEST - 2  VIEW COMPARISON:  Chest x-ray 09/25/2020. FINDINGS: Lung volumes are normal. No consolidative airspace disease. No pleural effusions. No pneumothorax. No pulmonary nodule or mass noted. Pulmonary vasculature and the cardiomediastinal silhouette are within normal limits. Orthopedic fixation hardware in the lower cervical spine incidentally noted. IMPRESSION: No radiographic evidence of acute cardiopulmonary disease. Electronically Signed   By: Trudie Reed M.D.   On: 01/27/2021 11:33    No Known Allergies  Past Medical History:  Diagnosis Date   Arthritis    Borderline high cholesterol    GERD  (gastroesophageal reflux disease)    occais, no meds   Hypertension    Smoker    Social History   Socioeconomic History   Marital status: Single    Spouse name: Not on file   Number of children: Not on file   Years of education: Not on file   Highest education level: Not on file  Occupational History   Not on file  Tobacco Use   Smoking status: Every Day    Packs/day: 0.50    Types: Cigarettes   Smokeless tobacco: Never  Vaping Use   Vaping Use: Never used  Substance and Sexual Activity   Alcohol use: No   Drug use: No   Sexual activity: Yes    Birth control/protection: None  Other Topics Concern   Not on file  Social History Narrative   Not on file   Social Determinants of Health   Financial Resource Strain: Not on file  Food Insecurity: Not on file  Transportation Needs: Not on file  Physical Activity: Not on file  Stress: Not on file  Social Connections: Not on file  Intimate Partner Violence: Not on file   Family History  Problem Relation Age of Onset   Healthy Mother    Cancer Father    Past Surgical History:  Procedure Laterality Date   ANTERIOR CERVICAL DECOMP/DISCECTOMY FUSION N/A 04/08/2018   Procedure: Cervical Five-Six, Cervical Six-Seven Anterior cervical decompression/discectomy/fusion;  Surgeon: Tia Alert, MD;  Location: [facility] OR;  Service: Neurosurgery;  Laterality: N/A;  anterior   EYE SURGERY  to straighten it out   Right   I & D EXTREMITY Right 05/30/2020   Procedure: IRRIGATION AND DEBRIDEMENT OF RIGHT SMALL  FINGER;  Surgeon: Ernest Mallick, MD;  Location: [facility];  Service: Orthopedics;  Laterality: Right;   INCISION AND DRAINAGE OF WOUND Right 06/17/2020   Procedure: Right small finger irrigation and debridement, application of integra;  Surgeon: Ernest Mallick, MD;  Location: [facility];  Service: Orthopedics;  Laterality: Right;  Joylene Grapes, MD 01/27/21 1309

## 2021-01-27 NOTE — ED Triage Notes (Signed)
Pt reports on going chest congestion with cough. Pt seen for same last month.

## 2021-11-14 ENCOUNTER — Other Ambulatory Visit: Payer: Self-pay | Admitting: Internal Medicine

## 2021-11-17 LAB — HIV ANTIBODY (ROUTINE TESTING W REFLEX): HIV 1&2 Ab, 4th Generation: NONREACTIVE

## 2021-11-17 LAB — COMPLETE METABOLIC PANEL WITH GFR
AG Ratio: 1.6 (calc) (ref 1.0–2.5)
ALT: 27 U/L (ref 9–46)
AST: 22 U/L (ref 10–35)
Albumin: 4.3 g/dL (ref 3.6–5.1)
Alkaline phosphatase (APISO): 81 U/L (ref 35–144)
BUN: 17 mg/dL (ref 7–25)
CO2: 22 mmol/L (ref 20–32)
Calcium: 9 mg/dL (ref 8.6–10.3)
Chloride: 108 mmol/L (ref 98–110)
Creat: 1.13 mg/dL (ref 0.70–1.35)
Globulin: 2.7 g/dL (calc) (ref 1.9–3.7)
Glucose, Bld: 103 mg/dL — ABNORMAL HIGH (ref 65–99)
Potassium: 4.5 mmol/L (ref 3.5–5.3)
Sodium: 141 mmol/L (ref 135–146)
Total Bilirubin: 0.3 mg/dL (ref 0.2–1.2)
Total Protein: 7 g/dL (ref 6.1–8.1)
eGFR: 74 mL/min/{1.73_m2} (ref >=60)

## 2021-11-17 LAB — LIPID PANEL
Cholesterol: 168 mg/dL (ref <200)
HDL: 54 mg/dL (ref >=40)
LDL Cholesterol (Calc): 96 mg/dL (calc)
Non-HDL Cholesterol (Calc): 114 mg/dL (calc) (ref <130)
Total CHOL/HDL Ratio: 3.1 (calc) (ref <5.0)
Triglycerides: 85 mg/dL (ref <150)

## 2021-11-17 LAB — CBC
HCT: 41.5 % (ref 38.5–50.0)
Hemoglobin: 14.2 g/dL (ref 13.2–17.1)
MCH: 31.3 pg (ref 27.0–33.0)
MCHC: 34.2 g/dL (ref 32.0–36.0)
MCV: 91.4 fL (ref 80.0–100.0)
MPV: 9.2 fL (ref 7.5–12.5)
Platelets: 326 10*3/uL (ref 140–400)
RBC: 4.54 10*6/uL (ref 4.20–5.80)
RDW: 13 % (ref 11.0–15.0)
WBC: 4.6 10*3/uL (ref 3.8–10.8)

## 2021-11-17 LAB — PSA: PSA: 0.65 ng/mL (ref <=4.00)

## 2021-11-17 LAB — VITAMIN D 25 HYDROXY (VIT D DEFICIENCY, FRACTURES): Vit D, 25-Hydroxy: 23 ng/mL — ABNORMAL LOW (ref 30–100)

## 2021-11-17 LAB — RPR: RPR Ser Ql: NONREACTIVE

## 2021-11-17 LAB — TSH: TSH: 1.03 mIU/L (ref 0.40–4.50)

## 2022-01-02 ENCOUNTER — Other Ambulatory Visit (HOSPITAL_COMMUNITY): Payer: Self-pay | Admitting: Surgical

## 2022-01-02 MED ORDER — DICLOFENAC SODIUM 75 MG PO TBEC: 75.0000 mg | DELAYED_RELEASE_TABLET | Freq: Two times a day (BID) | ORAL | 2 refills | Status: DC | PRN

## 2022-06-01 NOTE — H&P (Signed)
  Patient: Jonathan Carney  PID: [id number]  DOB: 08-Jan-1961  SEX: Male   Patient referred by DDS for extraction all remaining teeth to prepare for hip replacement.   CC: No pain in mouth. Hip hurts.  Past Medical History:  Smoker, High Blood Pressure, Obese, Hypercholesterolemia, Osteoarthritis    Medications: Meloxicam, Amlodipine, Atorvastatin    Allergies:     None    Surgeries:   Finger surgery, neck surgery     Social History       Smoking: 1/2 ppd           Alcohol: Drug use:                             Exam: BMI 35. Caries, bone loss remaining teeth # 3, 4, 5, 6, 7, 8, 9, 10, 11, 15, 16, 20, 21, 22, 23, 27, 32. Bilateral lingual tori.  No purulence, edema, fluctuance, trismus. Oral cancer screening negative. Pharynx clear. No lymphadenopathy.  Panorex:Caries, bone loss remaining teeth # 3, 4, 5, 6, 7, 8, 9, 10, 11, 15, 16, 20, 21, 22, 23, 27, 32.Impacted tooth #16.   Assessment: ASA 3. Non-restorable teeth # 3, 4, 5, 6, 7, 8, 9, 10, 11, 15, 16, 20, 21, 22, 23, 27, 32. Impacted tooth #16. Bilateral mandibular lingual tori.               Plan: Extraction Teeth #3, 4, 5, 6, 7, 8, 9, 10, 11, 15, 16, 20, 21, 22, 23, 27, 32. Alveoloplasty. Removal bilateral lingual tori.   Hospital Day surgery.                 Rx: n               Risks and complications explained. Questions answered.   Gae Bon, DMD

## 2022-06-03 ENCOUNTER — Other Ambulatory Visit: Payer: Self-pay

## 2022-06-03 ENCOUNTER — Encounter (HOSPITAL_COMMUNITY): Payer: Self-pay | Admitting: Oral Surgery

## 2022-06-03 NOTE — Progress Notes (Addendum)
SDW call  PCP - Dr. Jeb Levering Cardiologist - Denies Pulmonary: Denies   PPM/ICD - Denies   Chest x-ray - 01/27/2021 EKG -  Obtain DOS, 06/05/2022 Stress Test - ECHO -  Cardiac Cath -   Sleep Study/sleep apnea/CPAP: Denies  Non-Diabetic   Blood Thinner Instructions: Denies Aspirin Instructions:Denies   ERAS Protcol - No, NPO PRE-SURGERY Ensure or G2- No   COVID TEST- n/a     Anesthesia review: No    Patient denies shortness of breath, fever, cough and chest pain over the phone call    Your procedure is scheduled on Friday June 05, 2022  Report to [facility] Main Entrance "A" at 0800 A.M., then check in with the Admitting office.  Call this number if you have problems the morning of surgery:  [phone]   If you have any questions prior to your surgery date call [phone]: Open Monday-Friday 8am-4pm If you experience any cold or flu symptoms such as cough, fever, chills, shortness of breath, etc. between now and your scheduled surgery, please notify us at the above number     Remember:  Do not eat or drink after midnight the night before your surgery    Take these medicines the morning of surgery with A SIP OF WATER:  Norvasc  As needed: Flexeril, Norco, Prilosec  As of today, STOP taking any Aspirin (unless otherwise instructed by your surgeon) Aleve, Naproxen, Ibuprofen, Motrin, Advil, Goody's, BC's, all herbal medications, fish oil, and all vitamins.  This includes your Mobic and Voltaren gel

## 2022-06-05 ENCOUNTER — Other Ambulatory Visit: Payer: Self-pay

## 2022-06-05 ENCOUNTER — Ambulatory Visit (HOSPITAL_COMMUNITY)
Admission: RE | Admit: 2022-06-05 | Discharge: 2022-06-05 | Disposition: A | Discharge status: Home / Self Care | Payer: Medicaid Other | Attending: Oral Surgery | Admitting: Oral Surgery

## 2022-06-05 ENCOUNTER — Ambulatory Visit (HOSPITAL_BASED_OUTPATIENT_CLINIC_OR_DEPARTMENT_OTHER): Payer: Medicaid Other | Admitting: Certified Registered"

## 2022-06-05 ENCOUNTER — Encounter (HOSPITAL_COMMUNITY): Payer: Self-pay | Admitting: Oral Surgery

## 2022-06-05 ENCOUNTER — Ambulatory Visit (HOSPITAL_COMMUNITY): Payer: Medicaid Other | Admitting: Certified Registered"

## 2022-06-05 ENCOUNTER — Encounter (HOSPITAL_COMMUNITY)
Admission: RE | Disposition: A | Discharge status: Home / Self Care | Payer: Self-pay | Source: Home / Self Care | Attending: Oral Surgery

## 2022-06-05 DIAGNOSIS — K029 Dental caries, unspecified: Secondary | ICD-10-CM | POA: Diagnosis not present

## 2022-06-05 DIAGNOSIS — K0889 Other specified disorders of teeth and supporting structures: Secondary | ICD-10-CM

## 2022-06-05 DIAGNOSIS — Z79899 Other long term (current) drug therapy: Secondary | ICD-10-CM | POA: Diagnosis not present

## 2022-06-05 DIAGNOSIS — F17200 Nicotine dependence, unspecified, uncomplicated: Secondary | ICD-10-CM | POA: Insufficient documentation

## 2022-06-05 DIAGNOSIS — I10 Essential (primary) hypertension: Secondary | ICD-10-CM | POA: Insufficient documentation

## 2022-06-05 DIAGNOSIS — M270 Developmental disorders of jaws: Secondary | ICD-10-CM | POA: Diagnosis not present

## 2022-06-05 DIAGNOSIS — M4802 Spinal stenosis, cervical region: Secondary | ICD-10-CM | POA: Insufficient documentation

## 2022-06-05 DIAGNOSIS — M1990 Unspecified osteoarthritis, unspecified site: Secondary | ICD-10-CM | POA: Diagnosis not present

## 2022-06-05 DIAGNOSIS — K011 Impacted teeth: Secondary | ICD-10-CM | POA: Insufficient documentation

## 2022-06-05 HISTORY — PX: TOOTH EXTRACTION: SHX859

## 2022-06-05 LAB — BASIC METABOLIC PANEL
Anion gap: 11 (ref 5–15)
BUN: 13 mg/dL (ref 6–20)
CO2: 24 mmol/L (ref 22–32)
Calcium: 8.8 mg/dL — ABNORMAL LOW (ref 8.9–10.3)
Chloride: 104 mmol/L (ref 98–111)
Creatinine, Ser: 1.15 mg/dL (ref 0.61–1.24)
GFR, Estimated: >60 mL/min (ref >60)
Glucose, Bld: 112 mg/dL — ABNORMAL HIGH (ref 70–99)
Potassium: 4 mmol/L (ref 3.5–5.1)
Sodium: 139 mmol/L (ref 135–145)

## 2022-06-05 LAB — CBC
HCT: 40.5 % (ref 39.0–52.0)
Hemoglobin: 13.8 g/dL (ref 13.0–17.0)
MCH: 31.2 pg (ref 26.0–34.0)
MCHC: 34.1 g/dL (ref 30.0–36.0)
MCV: 91.6 fL (ref 80.0–100.0)
Platelets: 292 10*3/uL (ref 150–400)
RBC: 4.42 MIL/uL (ref 4.22–5.81)
RDW: 13.5 % (ref 11.5–15.5)
WBC: 5.9 10*3/uL (ref 4.0–10.5)
nRBC: 0 % (ref 0.0–0.2)

## 2022-06-05 LAB — SURGICAL PCR SCREEN
MRSA, PCR: NEGATIVE
Staphylococcus aureus: NEGATIVE

## 2022-06-05 SURGERY — DENTAL RESTORATION/EXTRACTIONS
Anesthesia: General | Site: Mouth

## 2022-06-05 MED ORDER — FENTANYL CITRATE (PF) 250 MCG/5ML IJ SOLN
INTRAMUSCULAR | Status: AC
  Filled 2022-06-05: qty 5

## 2022-06-05 MED ORDER — LACTATED RINGERS IV SOLN: INTRAVENOUS | Status: DC

## 2022-06-05 MED ORDER — ACETAMINOPHEN 160 MG/5ML PO SOLN: 325.0000 mg | ORAL | Status: DC | PRN

## 2022-06-05 MED ORDER — ROCURONIUM BROMIDE 10 MG/ML (PF) SYRINGE
PREFILLED_SYRINGE | INTRAVENOUS | Status: AC
  Filled 2022-06-05: qty 10

## 2022-06-05 MED ORDER — ONDANSETRON HCL 4 MG/2ML IJ SOLN
INTRAMUSCULAR | Status: DC | PRN
  Administered 2022-06-05: 4 mg via INTRAVENOUS

## 2022-06-05 MED ORDER — PROPOFOL 10 MG/ML IV BOLUS
INTRAVENOUS | Status: AC
  Filled 2022-06-05: qty 20

## 2022-06-05 MED ORDER — EPHEDRINE 5 MG/ML INJ
INTRAVENOUS | Status: AC
  Filled 2022-06-05: qty 5

## 2022-06-05 MED ORDER — ORAL CARE MOUTH RINSE: 15.0000 mL | Freq: Once | OROMUCOSAL | Status: AC

## 2022-06-05 MED ORDER — 0.9 % SODIUM CHLORIDE (POUR BTL) OPTIME
TOPICAL | Status: DC | PRN
  Administered 2022-06-05: 1000 mL

## 2022-06-05 MED ORDER — ACETAMINOPHEN 10 MG/ML IV SOLN: 1000.0000 mg | Freq: Once | INTRAVENOUS | Status: DC

## 2022-06-05 MED ORDER — DEXAMETHASONE SODIUM PHOSPHATE 10 MG/ML IJ SOLN
INTRAMUSCULAR | Status: AC
  Filled 2022-06-05: qty 1

## 2022-06-05 MED ORDER — SUGAMMADEX SODIUM 200 MG/2ML IV SOLN
INTRAVENOUS | Status: DC | PRN
  Administered 2022-06-05: 200 mg via INTRAVENOUS

## 2022-06-05 MED ORDER — FENTANYL CITRATE (PF) 250 MCG/5ML IJ SOLN
INTRAMUSCULAR | Status: DC | PRN
  Administered 2022-06-05: 100 ug via INTRAVENOUS
  Administered 2022-06-05: 50 ug via INTRAVENOUS

## 2022-06-05 MED ORDER — KETOROLAC TROMETHAMINE 30 MG/ML IJ SOLN: 30.0000 mg | Freq: Once | INTRAMUSCULAR | Status: AC

## 2022-06-05 MED ORDER — SODIUM CHLORIDE 0.9 % IR SOLN
Status: DC | PRN
  Administered 2022-06-05: 1000 mL

## 2022-06-05 MED ORDER — MEPERIDINE HCL 25 MG/ML IJ SOLN: 6.2500 mg | INTRAMUSCULAR | Status: DC | PRN

## 2022-06-05 MED ORDER — ONDANSETRON HCL 4 MG/2ML IJ SOLN: 4.0000 mg | Freq: Once | INTRAMUSCULAR | Status: DC | PRN

## 2022-06-05 MED ORDER — ACETAMINOPHEN 10 MG/ML IV SOLN
INTRAVENOUS | Status: AC
  Filled 2022-06-05: qty 100

## 2022-06-05 MED ORDER — OXYCODONE-ACETAMINOPHEN 5-325 MG PO TABS: 1.0000 | ORAL_TABLET | Freq: Four times a day (QID) | ORAL | 0 refills | Status: DC | PRN

## 2022-06-05 MED ORDER — OXYCODONE HCL 5 MG PO TABS
ORAL_TABLET | ORAL | Status: AC
  Administered 2022-06-05: 5 mg via ORAL
  Filled 2022-06-05: qty 1

## 2022-06-05 MED ORDER — PROPOFOL 10 MG/ML IV BOLUS
INTRAVENOUS | Status: DC | PRN
  Administered 2022-06-05: 200 mg via INTRAVENOUS

## 2022-06-05 MED ORDER — ONDANSETRON HCL 4 MG/2ML IJ SOLN
INTRAMUSCULAR | Status: AC
  Filled 2022-06-05: qty 2

## 2022-06-05 MED ORDER — ACETAMINOPHEN 10 MG/ML IV SOLN
1000.0000 mg | Freq: Once | INTRAVENOUS | Status: DC | PRN
  Administered 2022-06-05: 1000 mg via INTRAVENOUS

## 2022-06-05 MED ORDER — OXYMETAZOLINE HCL 0.05 % NA SOLN
NASAL | Status: DC | PRN
  Administered 2022-06-05 (×2): 2 via NASAL

## 2022-06-05 MED ORDER — FENTANYL CITRATE (PF) 100 MCG/2ML IJ SOLN: 25.0000 ug | INTRAMUSCULAR | Status: DC | PRN

## 2022-06-05 MED ORDER — CHLORHEXIDINE GLUCONATE 0.12 % MT SOLN
OROMUCOSAL | Status: AC
  Administered 2022-06-05: 15 mL via OROMUCOSAL
  Filled 2022-06-05: qty 15

## 2022-06-05 MED ORDER — PHENYLEPHRINE 80 MCG/ML (10ML) SYRINGE FOR IV PUSH (FOR BLOOD PRESSURE SUPPORT)
PREFILLED_SYRINGE | INTRAVENOUS | Status: AC
  Filled 2022-06-05: qty 10

## 2022-06-05 MED ORDER — LIDOCAINE 2% (20 MG/ML) 5 ML SYRINGE
INTRAMUSCULAR | Status: DC | PRN
  Administered 2022-06-05: 100 mg via INTRAVENOUS

## 2022-06-05 MED ORDER — LIDOCAINE-EPINEPHRINE 2 %-1:100000 IJ SOLN
INTRAMUSCULAR | Status: AC
  Filled 2022-06-05: qty 1

## 2022-06-05 MED ORDER — CEFAZOLIN SODIUM-DEXTROSE 2-4 GM/100ML-% IV SOLN
2.0000 g | INTRAVENOUS | Status: AC
  Administered 2022-06-05: 2 g via INTRAVENOUS
  Filled 2022-06-05: qty 100

## 2022-06-05 MED ORDER — AMOXICILLIN 500 MG PO CAPS: 500.0000 mg | ORAL_CAPSULE | Freq: Three times a day (TID) | ORAL | 0 refills | Status: DC

## 2022-06-05 MED ORDER — ROCURONIUM BROMIDE 10 MG/ML (PF) SYRINGE
PREFILLED_SYRINGE | INTRAVENOUS | Status: DC | PRN
  Administered 2022-06-05: 50 mg via INTRAVENOUS

## 2022-06-05 MED ORDER — OXYCODONE HCL 5 MG PO TABS: 5.0000 mg | ORAL_TABLET | Freq: Once | ORAL | Status: AC | PRN

## 2022-06-05 MED ORDER — CHLORHEXIDINE GLUCONATE 0.12 % MT SOLN: 15.0000 mL | Freq: Once | OROMUCOSAL | Status: AC

## 2022-06-05 MED ORDER — OXYCODONE HCL 5 MG/5ML PO SOLN: 5.0000 mg | Freq: Once | ORAL | Status: AC | PRN

## 2022-06-05 MED ORDER — EPHEDRINE SULFATE (PRESSORS) 50 MG/ML IJ SOLN
INTRAMUSCULAR | Status: DC | PRN
  Administered 2022-06-05 (×2): 5 mg via INTRAVENOUS

## 2022-06-05 MED ORDER — LIDOCAINE 2% (20 MG/ML) 5 ML SYRINGE
INTRAMUSCULAR | Status: AC
  Filled 2022-06-05: qty 5

## 2022-06-05 MED ORDER — ACETAMINOPHEN 325 MG PO TABS: 325.0000 mg | ORAL_TABLET | ORAL | Status: DC | PRN

## 2022-06-05 MED ORDER — KETOROLAC TROMETHAMINE 30 MG/ML IJ SOLN
INTRAMUSCULAR | Status: AC
  Administered 2022-06-05: 30 mg via INTRAVENOUS
  Filled 2022-06-05: qty 1

## 2022-06-05 MED ORDER — LIDOCAINE-EPINEPHRINE 2 %-1:100000 IJ SOLN
INTRAMUSCULAR | Status: DC | PRN
  Administered 2022-06-05: 20 mL via INTRADERMAL

## 2022-06-05 MED ORDER — PHENYLEPHRINE HCL (PRESSORS) 10 MG/ML IV SOLN
INTRAVENOUS | Status: DC | PRN
  Administered 2022-06-05: 160 ug via INTRAVENOUS
  Administered 2022-06-05: 80 ug via INTRAVENOUS
  Administered 2022-06-05 (×2): 160 ug via INTRAVENOUS

## 2022-06-05 MED ORDER — DEXAMETHASONE SODIUM PHOSPHATE 10 MG/ML IJ SOLN
INTRAMUSCULAR | Status: DC | PRN
  Administered 2022-06-05: 10 mg via INTRAVENOUS

## 2022-06-05 SURGICAL SUPPLY — 36 items
BAG COUNTER SPONGE SURGICOUNT (BAG) IMPLANT
BAG SPNG CNTER NS LX DISP (BAG)
BLADE SURG 15 STRL LF DISP TIS (BLADE) ×2 IMPLANT
BLADE SURG 15 STRL SS (BLADE) ×2
BUR CROSS CUT FISSURE 1.6 (BURR) ×2 IMPLANT
BUR EGG ELITE 4.0 (BURR) ×2 IMPLANT
CANISTER SUCT 3000ML PPV (MISCELLANEOUS) ×2 IMPLANT
COVER SURGICAL LIGHT HANDLE (MISCELLANEOUS) ×2 IMPLANT
GAUZE PACKING FOLDED 2  STR (GAUZE/BANDAGES/DRESSINGS) ×1
GAUZE PACKING FOLDED 2 STR (GAUZE/BANDAGES/DRESSINGS) ×2 IMPLANT
GLOVE BIO SURGEON STRL SZ 6.5 (GLOVE) IMPLANT
GLOVE BIO SURGEON STRL SZ7 (GLOVE) IMPLANT
GLOVE BIO SURGEON STRL SZ8 (GLOVE) ×2 IMPLANT
GLOVE BIOGEL PI IND STRL 6.5 (GLOVE) IMPLANT
GLOVE BIOGEL PI IND STRL 7.0 (GLOVE) IMPLANT
GOWN STRL REUS W/ TWL LRG LVL3 (GOWN DISPOSABLE) ×2 IMPLANT
GOWN STRL REUS W/ TWL XL LVL3 (GOWN DISPOSABLE) ×2 IMPLANT
GOWN STRL REUS W/TWL LRG LVL3 (GOWN DISPOSABLE) ×1
GOWN STRL REUS W/TWL XL LVL3 (GOWN DISPOSABLE) ×1
IV NS 1000ML (IV SOLUTION) ×1
IV NS 1000ML BAXH (IV SOLUTION) ×2 IMPLANT
KIT BASIN OR (CUSTOM PROCEDURE TRAY) ×2 IMPLANT
KIT TURNOVER KIT B (KITS) ×2 IMPLANT
NDL HYPO 25GX1X1/2 BEV (NEEDLE) ×4 IMPLANT
NEEDLE HYPO 25GX1X1/2 BEV (NEEDLE) ×1 IMPLANT
NS IRRIG 1000ML POUR BTL (IV SOLUTION) ×2 IMPLANT
PAD ARMBOARD 7.5X6 YLW CONV (MISCELLANEOUS) ×2 IMPLANT
SLEEVE IRRIGATION ELITE 7 (MISCELLANEOUS) ×2 IMPLANT
SPIKE FLUID TRANSFER (MISCELLANEOUS) ×2 IMPLANT
SPONGE SURGIFOAM ABS GEL 12-7 (HEMOSTASIS) IMPLANT
SUT CHROMIC 3 0 PS 2 (SUTURE) ×2 IMPLANT
SYR BULB IRRIG 60ML STRL (SYRINGE) ×2 IMPLANT
SYR CONTROL 10ML LL (SYRINGE) ×2 IMPLANT
TRAY ENT MC OR (CUSTOM PROCEDURE TRAY) ×2 IMPLANT
TUBING IRRIGATION (MISCELLANEOUS) ×2 IMPLANT
YANKAUER SUCT BULB TIP NO VENT (SUCTIONS) ×2 IMPLANT

## 2022-06-05 NOTE — Anesthesia Preprocedure Evaluation (Signed)
Anesthesia Evaluation  Patient identified by MRN, date of birth, ID band Patient awake    Reviewed: Allergy & Precautions, NPO status , Patient's Chart, lab work & pertinent test results  History of Anesthesia Complications Negative for: history of anesthetic complications  Airway Mallampati: II  TM Distance: >3 FB Neck ROM: Full    Dental  (+) Dental Advisory Given, Chipped, Poor Dentition, Loose, Missing,    Pulmonary neg pulmonary ROS, Current Smoker and Patient abstained from smoking.   Pulmonary exam normal        Cardiovascular hypertension, Pt. on medications (-) angina (-) Past MI and (-) CHF Normal cardiovascular exam Rhythm:Regular     Neuro/Psych Foraminal stenosis of cervical region  negative psych ROS   GI/Hepatic Neg liver ROS,GERD  Medicated and Controlled,,  Endo/Other    Renal/GU negative Renal ROS  negative genitourinary   Musculoskeletal  (+) Arthritis ,    Abdominal  (+) + obese  Peds  Hematology negative hematology ROS (+)   Anesthesia Other Findings   Reproductive/Obstetrics                             Anesthesia Physical Anesthesia Plan  ASA: II  Anesthesia Plan: General   Post-op Pain Management:    Induction: Intravenous  PONV Risk Score and Plan: 1 and Treatment may vary due to age or medical condition, Ondansetron, Midazolam and Dexamethasone  Airway Management Planned: Nasal ETT  Additional Equipment: None  Intra-op Plan:   Post-operative Plan: Extubation in OR  Informed Consent: I have reviewed the patients History and Physical, chart, labs and discussed the procedure including the risks, benefits and alternatives for the proposed anesthesia with the patient or authorized representative who has indicated his/her understanding and acceptance.     Dental advisory given  Plan Discussed with: CRNA  Anesthesia Plan Comments: (   )         Anesthesia Quick Evaluation

## 2022-06-05 NOTE — H&P (Signed)
H&P documentation  -History and Physical Reviewed  -Patient has been re-examined  -No change in the plan of care  Jonathan Carney  

## 2022-06-05 NOTE — Op Note (Signed)
06/05/2022  12:24 PM  PATIENT:  Jonathan Carney  62 y.o. male  PRE-OPERATIVE DIAGNOSIS:  NON-RESTORABLE TEETH NUMBER THREE, FOUR, FIVE, SIX, SEVEN, EIGHT, NINE, TEN, ELEVEN, FIFTEEN, SIXTEEN, TWENTY, TWENTY-ONE, TWENTY-TWO, TWENTY-THREE, TWENTY-SEVEN, THIRTY TWO, SECONDARY TO DENTAL CARIES, BILATERAL MANDIBULAR LINGUAL TORI  POST-OPERATIVE DIAGNOSIS:  SAME  PROCEDURE:  Procedure(s): EXTRACTION TEETH NUMBER THREE, FOUR, FIVE, SIX, SEVEN, EIGHT, NINE, TEN, ELEVEN, FIFTEEN, SIXTEEN, TWENTY, TWENTY-ONE, TWENTY-TWO, TWENTY-THREE, TWENTY-SEVEN, THIRTY TWO, ALVEOLOPLASTY, AND REMOVAL OF BILATERAL LINGUAL TORI  SURGEON:  Surgeon(s): Diona Browner, DMD  ANESTHESIA:   local and general  EBL:  minimal  DRAINS: none   SPECIMEN:  No Specimen  COUNTS:  YES  PLAN OF CARE: Discharge to home after PACU  PATIENT DISPOSITION:  PACU - hemodynamically stable.   PROCEDURE DETAILS: Dictation [id number]  Gae Bon, DMD 06/05/2022 12:24 PM

## 2022-06-05 NOTE — Anesthesia Procedure Notes (Signed)
Procedure Name: Intubation Date/Time: 06/05/2022 11:06 AM  Performed by: Reggie Pile, CRNAPre-anesthesia Checklist: Patient identified, Emergency Drugs available, Suction available and Patient being monitored Patient Re-evaluated:Patient Re-evaluated prior to induction Oxygen Delivery Method: Circle system utilized Preoxygenation: Pre-oxygenation with 100% oxygen Induction Type: IV induction Ventilation: Mask ventilation without difficulty Laryngoscope Size: Glidescope and 3 Nasal Tubes: Nasal prep performed, Nasal Rae, Magill forceps- large, utilized and Right Tube size: 7.0 mm Number of attempts: 1 Placement Confirmation: ETT inserted through vocal cords under direct vision, positive ETCO2 and breath sounds checked- equal and bilateral Secured at: 25 cm Tube secured with: Tape Dental Injury: Teeth and Oropharynx as per pre-operative assessment  Comments: Afrin sprayed each nostril in preop x 2. After induction, LMA 4  inserted. Difficult ventilating with beard.  Right nare dilated with 28, 30, 32 nasal airways. 7.0 nasal rae tube inserted easily

## 2022-06-05 NOTE — Transfer of Care (Signed)
Immediate Anesthesia Transfer of Care Note  Patient: Jonathan Carney  Procedure(s) Performed: EXTRACTION TEETH NUMBER THREE, FOUR, FIVE, SIX, SEVEN, EIGHT, NINE, TEN, ELEVEN, FIFTEEN, SIXTEEN, TWENTY, TWENTY-ONE, TWENTY-TWO, TWENTY-THREE, TWENTY-SEVEN, THIRTY TWO, ALVEOLOPLASTY, AND REMOVAL OF BILATERAL LINGUAL TORI (Mouth)  Patient Location: PACU  Anesthesia Type:General  Level of Consciousness: awake and alert   Airway & Oxygen Therapy: Patient Spontanous Breathing and Patient connected to nasal cannula oxygen  Post-op Assessment: Report given to RN and Post -op Vital signs reviewed and stable  Post vital signs: Reviewed and stable  Last Vitals:  Vitals Value Taken Time  BP    Temp    Pulse    Resp    SpO2      Last Pain:  Vitals:   06/05/22 0756  PainSc: 10-Worst pain ever         Complications: No notable events documented.

## 2022-06-05 NOTE — Op Note (Unsigned)
NAMEAVITAJ, SCHOLLER MEDICAL RECORD NO: [id number] ACCOUNT NO: [id number] DATE OF BIRTH: 1960-08-26 FACILITY: [facility] LOCATION: [facility] PHYSICIAN: Gae Bon, DDS  Operative Report   DATE OF PROCEDURE: 06/05/2022  PREOPERATIVE DIAGNOSIS:  Nonrestorable teeth numbers 3, 4, 5, 6, 7, 8, 9, 10, 11, 15, 16, 20, 21, 22, 23, 27, 32 secondary to dental caries, bilateral mandibular lingual tori.  POSTOPERATIVE DIAGNOSIS:  Nonrestorable teeth numbers 3, 4, 5, 6, 7, 8, 9, 10, 11, 15, 16, 20, 21, 22, 23, 27, 32 secondary to dental caries, bilateral mandibular lingual tori.  PROCEDURE:  Extraction teeth numbers 3, 4, 5, 6, 7, 8, 9, 10, 11, 15, 16, 20, 21, 22, 23, 27 and 32.  Alveoloplasty right and left maxilla and mandible, removal of bilateral mandibular lingual tori.  SURGEON:  Gae Bon, DDS  ANESTHESIA:  General.  Dr. Jillyn Hidden attending, nasal intubation.  DESCRIPTION OF PROCEDURE:  The patient was taken to the operating room and placed on the table in supine position.  General anesthesia was administered and nasal endotracheal tube was placed and secured.  The eyes were protected and the patient was  draped for surgery.  Timeout was performed.  The posterior pharynx was suctioned and a throat pack was placed.  2% lidocaine 1:100,000 epinephrine was infiltrated in an inferior alveolar block on the right and left sides and in buccal and palatal  infiltration around the maxillary teeth to be removed.  A bite block was placed on the right side of the mouth and a sweetheart retractor was used to retract the tongue.  A #15 blade was used to make an incision along the left edentulous portion of the  mandible in the molar area, carried forward around teeth numbers 20, 21, 22 and 23 and then to the midline.  The periosteum was reflected both buccally and lingually around these teeth.  The teeth were elevated.  Tooth #23 was removed with a forceps, but  teeth numbers 21 and 22 could not be removed  using the forceps and the teeth fractured. The Stryker handpiece with the fissure bur was used under irrigation and to remove circumferential bone around the roots of these 2 teeth and then the roots were  removed with 301 elevator.  The sockets were curetted.  The periosteum was reflected to expose the large bilateral lingual tori approximately 3 cm x 2 cm x 2 cm.  The tissue was reflected lingually and then underneath the surface of the tori against the  inferior border of the mandible the [facility] elevator was used to retract the lingual tissue.  Then, the Stryker handpiece with the egg bur was used to recontour the tori until there was a smooth left mandibular lingual surface without any protuberances  then the area was irrigated and the left mandible was closed with 3-0 chromic.  Then, the left maxilla was operated on.  The 15 blade was used to make an incision overlying tooth #16 and then carried forward around tooth #15 to tooth numbers 11, 10, 9, 8  and 7.  The periosteum was reflected from around these teeth.  Tooth #15 required removal of bone and sectioning and removal of the roots individually in order to remove this tooth.  Tooth #11 also required removal of bone surrounding the tooth in order  to elevate it and remove it.  Teeth numbers 10, 9, 8 and 7 were removed using the 301 elevator and dental forceps.  The sockets were then curetted.  Then, the tissue was  reflected overlying tooth #16, which was completely impacted in the left maxilla.   The bone was removed using the Stryker handpiece and the tooth was elevated and removed with the dental elevator.  The sockets were curetted.  The periosteum was reflected to expose the alveolar crest, which was irregular in contour.  Alveoplasty was  performed using the egg bur followed by the bone file.  Then, the area was irrigated and closed with 3-0 chromic.  The bite block was repositioned to the other side of the mouth, the left maxilla was operated  next.  A 15 blade was used to make an  incision around teeth numbers 4, 5 and 6.  Periosteum was reflected.  The teeth were elevated.  Tooth #4 was removed with the dental forceps.  Teeth numbers 5 and 6 fractured upon attempted removal, necessitating removal of circumferential bone and  removal of the roots with 301 elevator.  Then, the sockets were curetted, irrigated.  The alveoplasty was performed using the egg bur followed by the bone file.  Then, the area was irrigated and closed with 3-0 chromic.  Then, in the right mandible the  15 blade was used to make an incision beginning in sulcus of tooth #32 carried forward on the edentulous mandible to tooth #27 and then connected with the midline incision previously made. The periosteum was reflected.  Bone was removed around these  teeth and the teeth were elevated and removed with the dental forceps then the lingual torus was exposed and was approximately the same size as of the mouth on the left side.  At this time, the fissure bur was used to make a vertical incision parallel to  the normal lingual border of the mandible and vertical aspect going from anterior to posterior.  Then, the 301 elevator was used to elevate this bone segment, which included a lingual torus.  Then, the remaining bone was smoothed with the Stryker  handpiece using the egg bur and then the bone file.  The Contour was felt and found to have good continuity with the bone and no protuberance was palpated.  Then, the area was irrigated and closed with 3-0 chromic.  The oral cavity was then irrigated and  suctioned.  Throat pack was removed.  The patient was left under the care of anesthesia for extubation and transported to recovery with plans for discharge home through day surgery.  ESTIMATED BLOOD LOSS:  Minimal.  COMPLICATIONS:  None.  SPECIMENS:  None.  COUNTS:  Correct.   PUS D: 06/05/2022 12:31:55 pm T: 06/05/2022 1:21:00 pm  JOB: [id number]

## 2022-06-05 NOTE — Progress Notes (Signed)
Patient states that his friend Rod will come pick him up and his GF Ivin Booty will stay with him tonight. Patient called Rod in front of RN and Rod stated that he will come pick patient up after surgery.

## 2022-06-06 ENCOUNTER — Encounter (HOSPITAL_COMMUNITY): Payer: Self-pay | Admitting: Oral Surgery

## 2022-06-06 NOTE — Anesthesia Postprocedure Evaluation (Signed)
Anesthesia Post Note  Patient: Human resources officer  Procedure(s) Performed: EXTRACTION TEETH NUMBER THREE, FOUR, FIVE, SIX, SEVEN, EIGHT, NINE, TEN, ELEVEN, FIFTEEN, SIXTEEN, TWENTY, TWENTY-ONE, TWENTY-TWO, TWENTY-THREE, TWENTY-SEVEN, THIRTY TWO, ALVEOLOPLASTY, AND REMOVAL OF BILATERAL LINGUAL TORI (Mouth)     Patient location during evaluation: PACU Anesthesia Type: General Level of consciousness: sedated and awake Pain management: pain level controlled Vital Signs Assessment: post-procedure vital signs reviewed and stable Respiratory status: spontaneous breathing Cardiovascular status: stable Postop Assessment: no apparent nausea or vomiting Anesthetic complications: no  No notable events documented.  Last Vitals:  Vitals:   06/05/22 1300 06/05/22 1315  BP: (!) 137/93   Pulse: 81   Resp: 16   Temp:  (!) 36.2 C  SpO2: 95%     Last Pain:  Vitals:   06/05/22 1300  PainSc: [place]

## 2022-07-09 NOTE — Progress Notes (Signed)
Sent message, via epic in basket, requesting orders in epic from surgeon.  

## 2022-07-15 NOTE — Progress Notes (Addendum)
The patient was identified using 2 approved identifiers. All issues noted in this document were discussed and addressed, [name] voiced understanding and agreement with all preoperative instructions. The patient was emailed the surgery instructions per his request to  [email or web address].      COVID Vaccine received:  []  No [x]  Yes Date of any COVID positive Test in last 90 days:  None  PCP - Fleet Contras, MD Cardiologist -   Chest x-ray - 01-27-2021  2v  Epic EKG -  06-05-2022  Epic Stress Test -  ECHO -  Cardiac Cath -   PCR screen: [x]  Ordered & Completed           []   No Order but Needs PROFEND           []   N/A for this surgery  Surgery Plan:  [x]  Ambulatory                            []  Outpatient in bed                            []  Admit  Anesthesia:    []  General  [x]  Spinal                           []   Choice []   MAC  Pacemaker / ICD device [x]  No []  Yes   Spinal Cord Stimulator:[x]  No []  Yes       History of Sleep Apnea? [x]  No []  Yes   CPAP used?- [x]  No []  Yes    Does the patient monitor blood sugar?          []  No []  Yes  [x]  N/A  Patient has: [x]  NO Hx DM   []  Pre-DM                 []  DM1  []   DM2  Blood Thinner / Instructions:  none Aspirin Instructions:  none  ERAS Protocol Ordered: []  No  []  Yes PRE-SURGERY []  ENSURE  []  G2  []  No Drink Ordered  Patient is to be NPO after: Midnight prior.  No Orders from Surgeon  Comments: NO SURGEON ORDERS AS OF 07-16-2022  Tamika sent IB message.  Patient was given the 5 CHG shower / bath instructions for THA surgery along with 2 bottles of the CHG soap. Patient will start this on: Saturday  Jul 25, 2022.  Patient voiced understanding of this process.   Activity level: Patient is able to climb a flight of stairs without difficulty; [x]  No CP  [x]  No SOB, but would have _leg pain.  Patient can perform ADLs without assistance.   Anesthesia review: HTN, ACDF (C5-6, C6-7 done 04-08-2018 by Dr. Yetta Barre),  GERD, Hx of strabismus surgery-right eye, smokes, Recent (06-05-2022) total tooth extractions in preparation for this hip surgery.   Patient denies shortness of breath, fever, cough and chest pain at PAT appointment.  Patient verbalized understanding and agreement to the Pre-Surgical Instructions that were given to them at this PAT appointment. Patient was also educated of the need to review these PAT instructions again prior to his surgery.I reviewed the appropriate phone numbers to call if they have any and questions or concerns.

## 2022-07-15 NOTE — Patient Instructions (Signed)
SURGICAL WAITING ROOM VISITATION Patients having surgery or a procedure may have no more than 2 support people in the waiting area - these visitors may rotate in the visitor waiting room.   Due to an increase in RSV and influenza rates and associated hospitalizations, children ages 95 and under may not visit patients in [facility] hospitals. If the patient needs to stay at the hospital during part of their recovery, the visitor guidelines for inpatient rooms apply.  PRE-OP VISITATION  Pre-op nurse will coordinate an appropriate time for 1 support person to accompany the patient in pre-op.  This support person may not rotate.  This visitor will be contacted when the time is appropriate for the visitor to come back in the pre-op area.  Please refer to the [facility] website for the visitor guidelines for Inpatients (after your surgery is over and you are in a regular room).  You are not required to quarantine at this time prior to your surgery. However, you must do this: Hand Hygiene often Do NOT share personal items Notify your provider if you are in close contact with someone who has COVID or you develop fever 100.4 or greater, new onset of sneezing, cough, sore throat, shortness of breath or body aches.  If you test positive for Covid or have been in contact with anyone that has tested positive in the last 10 days please notify you surgeon.    Your procedure is scheduled on:  Wednesday Jul 29, 2022  Report to [facility] Main Entrance: [facility] entrance where the [employer] is available.   Report to admitting at: 05:15   AM  +++++Call this number if you have any questions or problems the morning of surgery [phone]  Do not eat food after Midnight the night prior to your surgery/procedure.  After Midnight you may have the following liquids until  4:30  AM DAY OF SURGERY  Clear Liquid Diet Water Black Coffee (sugar ok, NO MILK/CREAM OR CREAMERS)  Tea (sugar ok, NO  MILK/CREAM OR CREAMERS) regular and decaf                             Plain Jell-O  with no fruit (NO RED)                                           Fruit ices (not with fruit pulp, NO RED)                                     Popsicles (NO RED)                                                                  Juice: apple, WHITE grape, WHITE cranberry Sports drinks like Gatorade or Powerade (NO RED)                   The day of surgery:  Drink ONE (1) Pre-Surgery Clear Ensure or G2 at        AM the morning of  surgery. Drink in one sitting. Do not sip.  This drink was given to you during your hospital pre-op appointment visit. Nothing else to drink after completing the Pre-Surgery Clear Ensure or G2 : No candy, chewing gum or throat lozenges.    FOLLOW  ANY ADDITIONAL PRE OP INSTRUCTIONS YOU RECEIVED FROM YOUR SURGEON'S OFFICE!!!   Oral Hygiene is also important to reduce your risk of infection.        Remember - BRUSH YOUR TEETH THE MORNING OF SURGERY WITH YOUR REGULAR TOOTHPASTE  Do NOT smoke after Midnight the night before surgery.  Take ONLY these medicines the morning of surgery with A SIP OF WATER: Amlodipine                   You may not have any metal on your body including  jewelry, and body piercing  Do not wear lotions, powders, cologne, or deodorant  Men may shave face and neck.  Contacts, Hearing Aids, dentures or bridgework may not be worn into surgery. DENTURES WILL BE REMOVED PRIOR TO SURGERY PLEASE DO NOT APPLY "Poly grip" OR ADHESIVES!!!  Patients discharged on the day of surgery will not be allowed to drive home.  Someone NEEDS to stay with you for the first 24 hours after anesthesia.  Do not bring your home medications to the hospital. The Pharmacy will dispense medications listed on your medication list to you during your admission in the Hospital.  Special Instructions: Bring a copy of your healthcare power of attorney and living will documents the day of  surgery, if you wish to have them scanned into your [facility] Records- EPIC  Please read over the following fact sheets you were given: IF YOU HAVE QUESTIONS ABOUT YOUR PRE-OP INSTRUCTIONS, PLEASE CALL [phone].   +++++++ PLEASE FOLLOW THE ATTACHED INFORMATION REGARDING SHOWERING / BATHING SCHEDULE  PRIOR TO YOUR SURGERY. Start this schedule on :  Wednesday  Jul 29, 2022   ON THE DAY OF SURGERY : Do not apply any lotions/deodorants the morning of surgery.  Please wear clean clothes to the hospital/surgery center.    FAILURE TO FOLLOW THESE INSTRUCTIONS MAY RESULT IN THE CANCELLATION OF YOUR SURGERY  PATIENT SIGNATURE_________________________________  NURSE SIGNATURE__________________________________  ________________________________________________________________________       Jonathan Carney    An incentive spirometer is a tool that can help keep your lungs clear and active. This tool measures how well you are filling your lungs with each breath. Taking long deep breaths may help reverse or decrease the chance of developing breathing (pulmonary) problems (especially infection) following: A long period of time when you are unable to move or be active. BEFORE THE PROCEDURE  If the spirometer includes an indicator to show your best effort, your nurse or respiratory therapist will set it to a desired goal. If possible, sit up straight or lean slightly forward. Try not to slouch. Hold the incentive spirometer in an upright position. INSTRUCTIONS FOR USE  Sit on the edge of your bed if possible, or sit up as far as you can in bed or on a chair. Hold the incentive spirometer in an upright position. Breathe out normally. Place the mouthpiece in your mouth and seal your lips tightly around it. Breathe in slowly and as deeply as possible, raising the piston or the ball toward the top of the column. Hold your breath for 3-5 seconds or for as long as possible. Allow  the piston or ball to fall to the bottom of the  column. Remove the mouthpiece from your mouth and breathe out normally. Rest for a few seconds and repeat Steps 1 through 7 at least 10 times every 1-2 hours when you are awake. Take your time and take a few normal breaths between deep breaths. The spirometer may include an indicator to show your best effort. Use the indicator as a goal to work toward during each repetition. After each set of 10 deep breaths, practice coughing to be sure your lungs are clear. If you have an incision (the cut made at the time of surgery), support your incision when coughing by placing a pillow or rolled up towels firmly against it. Once you are able to get out of bed, walk around indoors and cough well. You may stop using the incentive spirometer when instructed by your caregiver.  RISKS AND COMPLICATIONS Take your time so you do not get dizzy or light-headed. If you are in pain, you may need to take or ask for pain medication before doing incentive spirometry. It is harder to take a deep breath if you are having pain. AFTER USE Rest and breathe slowly and easily. It can be helpful to keep track of a log of your progress. Your caregiver can provide you with a simple table to help with this. If you are using the spirometer at home, follow these instructions: SEEK MEDICAL CARE IF:  You are having difficultly using the spirometer. You have trouble using the spirometer as often as instructed. Your pain medication is not giving enough relief while using the spirometer. You develop fever of 100.5 F (38.1 C) or higher.                                                                                                    SEEK IMMEDIATE MEDICAL CARE IF:  You cough up bloody sputum that had not been present before. You develop fever of 102 F (38.9 C) or greater. You develop worsening pain at or near the incision site. MAKE SURE YOU:  Understand these instructions. Will  watch your condition. Will get help right away if you are not doing well or get worse. Document Released: 07/06/2006 Document Revised: 05/18/2011 Document Reviewed: 09/06/2006 [facility] Patient Information 2014 [place], [place].

## 2022-07-16 ENCOUNTER — Encounter (HOSPITAL_COMMUNITY): Payer: Self-pay

## 2022-07-16 ENCOUNTER — Other Ambulatory Visit: Payer: Self-pay

## 2022-07-16 ENCOUNTER — Encounter (HOSPITAL_COMMUNITY)
Admission: RE | Admit: 2022-07-16 | Discharge: 2022-07-16 | Disposition: A | Discharge status: Home / Self Care | Payer: Medicaid Other | Source: Ambulatory Visit | Attending: Orthopedic Surgery | Admitting: Orthopedic Surgery

## 2022-07-16 DIAGNOSIS — Z01812 Encounter for preprocedural laboratory examination: Secondary | ICD-10-CM | POA: Insufficient documentation

## 2022-07-16 DIAGNOSIS — Z01818 Encounter for other preprocedural examination: Secondary | ICD-10-CM

## 2022-07-16 DIAGNOSIS — I10 Essential (primary) hypertension: Secondary | ICD-10-CM | POA: Diagnosis not present

## 2022-07-16 LAB — BASIC METABOLIC PANEL
Anion gap: 10 (ref 5–15)
BUN: 12 mg/dL (ref 6–20)
CO2: 24 mmol/L (ref 22–32)
Calcium: 8.8 mg/dL — ABNORMAL LOW (ref 8.9–10.3)
Chloride: 106 mmol/L (ref 98–111)
Creatinine, Ser: 0.98 mg/dL (ref 0.61–1.24)
GFR, Estimated: >60 mL/min (ref >60)
Glucose, Bld: 104 mg/dL — ABNORMAL HIGH (ref 70–99)
Potassium: 3.4 mmol/L — ABNORMAL LOW (ref 3.5–5.1)
Sodium: 140 mmol/L (ref 135–145)

## 2022-07-16 LAB — CBC
HCT: 41.5 % (ref 39.0–52.0)
Hemoglobin: 13.6 g/dL (ref 13.0–17.0)
MCH: 30.3 pg (ref 26.0–34.0)
MCHC: 32.8 g/dL (ref 30.0–36.0)
MCV: 92.4 fL (ref 80.0–100.0)
Platelets: 309 10*3/uL (ref 150–400)
RBC: 4.49 MIL/uL (ref 4.22–5.81)
RDW: 13.9 % (ref 11.5–15.5)
WBC: 4.4 10*3/uL (ref 4.0–10.5)
nRBC: 0 % (ref 0.0–0.2)

## 2022-07-16 LAB — SURGICAL PCR SCREEN
MRSA, PCR: NEGATIVE
Staphylococcus aureus: NEGATIVE

## 2022-07-28 ENCOUNTER — Ambulatory Visit (HOSPITAL_COMMUNITY): Payer: Self-pay | Admitting: Emergency Medicine

## 2022-07-28 DIAGNOSIS — G8929 Other chronic pain: Secondary | ICD-10-CM

## 2022-07-28 NOTE — H&P (View-Only) (Signed)
TOTAL HIP ADMISSION H&P  Patient is admitted for right total hip arthroplasty.  Subjective:  Chief Complaint: right hip pain  HPI: Jonathan Carney, 62 y.o. male, has a history of pain and functional disability in the right hip(s) due to arthritis and patient has failed non-surgical conservative treatments for greater than 12 weeks to include NSAID's and/or analgesics, corticosteriod injections, activity modification, and use of assistive device for ambulation .  Onset of symptoms was gradual starting 2 years ago with gradually worsening course since that time.The patient noted no past surgery on the right hip(s).  Patient currently rates pain in the right hip at 10 out of 10 with activity. Patient has night pain, worsening of pain with activity and weight bearing, pain that interfers with activities of daily living, and pain with passive range of motion. Patient has evidence of periarticular osteophytes and joint space narrowing by imaging studies. This condition presents safety issues increasing the risk of falls.  There is no current active infection.  Patient Active Problem List   Diagnosis Date Noted   Cellulitis of hand 05/29/2020   S/P cervical spinal fusion 04/08/2018   Past Medical History:  Diagnosis Date   Arthritis    Borderline high cholesterol    GERD (gastroesophageal reflux disease)    occais, no meds   Hypertension    Smoker     Past Surgical History:  Procedure Laterality Date   ANTERIOR CERVICAL DECOMP/DISCECTOMY FUSION N/A 04/08/2018   Procedure: Cervical Five-Six, Cervical Six-Seven Anterior cervical decompression/discectomy/fusion;  Surgeon: Jones, David S, MD;  Location: [facility] OR;  Service: Neurosurgery;  Laterality: N/A;  anterior   EYE SURGERY  to straighten it out   Right   I & D EXTREMITY Right 05/30/2020   Procedure: IRRIGATION AND DEBRIDEMENT OF RIGHT SMALL  FINGER;  Surgeon: Creighton, James J III, MD;  Location: [facility] OR;  Service: Orthopedics;  Laterality: Right;    INCISION AND DRAINAGE OF WOUND Right 06/17/2020   Procedure: Right small finger irrigation and debridement, application of integra;  Surgeon: Creighton, James J III, MD;  Location: [facility];  Service: Orthopedics;  Laterality: Right;  5TH   TOOTH EXTRACTION N/A 06/05/2022   Procedure: EXTRACTION TEETH NUMBER THREE, FOUR, FIVE, SIX, SEVEN, EIGHT, NINE, TEN, ELEVEN, FIFTEEN, SIXTEEN, TWENTY, TWENTY-ONE, TWENTY-TWO, TWENTY-THREE, TWENTY-SEVEN, THIRTY TWO, ALVEOLOPLASTY, AND REMOVAL OF BILATERAL LINGUAL TORI;  Surgeon: Jensen, Scott, DMD;  Location: [facility] OR;  Service: Oral Surgery;  Laterality: N/A;    Current Outpatient Medications  Medication Sig Dispense Refill Last Dose   amLODipine (NORVASC) 5 MG tablet Take 5 mg by mouth daily.      amoxicillin (AMOXIL) 500 MG capsule Take 1 capsule (500 mg total) by mouth 3 (three) times daily. (Patient not taking: Reported on 07/14/2022) 21 capsule 0    Aspirin-Acetaminophen-Caffeine (GOODY HEADACHE PO) Take 1 packet by mouth daily as needed (pain).      atorvastatin (LIPITOR) 40 MG tablet Take 40 mg by mouth at bedtime.      caffeine 200 MG TABS tablet Take 200 mg by mouth daily as needed (fatigue).      cyclobenzaprine (FLEXERIL) 10 MG tablet Take 10 mg by mouth at bedtime.      meloxicam (MOBIC) 7.5 MG tablet Take 7.5 mg by mouth 2 (two) times daily.      omeprazole (PRILOSEC) 40 MG capsule Take 40 mg by mouth daily as needed (Heartburn).      oxybutynin (DITROPAN) 5 MG tablet Take 10 mg by   mouth at bedtime.      oxyCODONE-acetaminophen (PERCOCET) 5-325 MG tablet Take 1 tablet by mouth every 6 (six) hours as needed for severe pain. (Patient not taking: Reported on 07/14/2022) 30 tablet 0    No current facility-administered medications for this visit.   No Known Allergies  Social History   Tobacco Use   Smoking status: Every Day    Packs/day: .5    Types: Cigarettes   Smokeless tobacco: Never  Substance Use Topics   Alcohol use: No     Family History  Problem Relation Age of Onset   Healthy Mother    Cancer Father      Review of Systems  Musculoskeletal:  Positive for arthralgias.  All other systems reviewed and are negative.   Objective:  Physical Exam Constitutional:      General: He is not in acute distress.    Appearance: Normal appearance. He is normal weight.  HENT:     Head: Normocephalic and atraumatic.  Eyes:     Extraocular Movements: Extraocular movements intact.     Conjunctiva/sclera: Conjunctivae normal.     Pupils: Pupils are equal, round, and reactive to light.  Cardiovascular:     Rate and Rhythm: Normal rate and regular rhythm.     Pulses: Normal pulses.     Heart sounds: Normal heart sounds.  Pulmonary:     Effort: Pulmonary effort is normal. No respiratory distress.     Breath sounds: Normal breath sounds.  Abdominal:     General: Bowel sounds are normal. There is no distension.     Palpations: Abdomen is soft.     Tenderness: There is no abdominal tenderness.  Musculoskeletal:        General: Tenderness present.     Cervical back: Normal range of motion and neck supple.     Comments: TTP over lateral aspect of hip and groin.  Reduced ROM in all directions due to pain.  Mild RLE weakness due to pain.  BLE appear grossly neurovascularly intact.  No lesion over area of chief complaint.  No swelling or erythema of right hip.  Lumbar/SI nonTTP.  Gait mildly antalgic.  Lymphadenopathy:     Cervical: No cervical adenopathy.  Skin:    General: Skin is warm and dry.     Capillary Refill: Capillary refill takes less than 2 seconds.     Findings: No erythema or rash.  Neurological:     General: No focal deficit present.     Mental Status: He is alert and oriented to person, place, and time.  Psychiatric:        Mood and Affect: Mood normal.        Behavior: Behavior normal.     Vital signs in last 24 hours: @VSRANGES@  Labs:   Estimated body mass index is 35.26 kg/m as  calculated from the following:   Height as of 06/05/22: 6' (1.829 m).   Weight as of 06/05/22: 117.9 kg.   Imaging Review Plain radiographs demonstrate severe degenerative joint disease of the right hip(s). The bone quality appears to be good for age and reported activity level.      Assessment/Plan:  End stage arthritis, right hip(s)  The patient history, physical examination, clinical judgement of the provider and imaging studies are consistent with end stage degenerative joint disease of the right hip(s) and total hip arthroplasty is deemed medically necessary. The treatment options including medical management, injection therapy, arthroscopy and arthroplasty were discussed at length. The risks   and benefits of total hip arthroplasty were presented and reviewed. The risks due to aseptic loosening, infection, stiffness, dislocation/subluxation,  thromboembolic complications and other imponderables were discussed.  The patient acknowledged the explanation, agreed to proceed with the plan and consent was signed. Patient is being admitted for inpatient treatment for surgery, pain control, PT, OT, prophylactic antibiotics, VTE prophylaxis, progressive ambulation and ADL's and discharge planning.The patient is planning to be discharged home with outpatient PT.   

## 2022-07-28 NOTE — Anesthesia Preprocedure Evaluation (Signed)
Anesthesia Evaluation  Patient identified by MRN, date of birth, ID band Patient awake    Reviewed: Allergy & Precautions, NPO status , Patient's Chart, lab work & pertinent test results  History of Anesthesia Complications Negative for: history of anesthetic complications  Airway Mallampati: III  TM Distance: >3 FB Neck ROM: Full    Dental  (+) Dental Advisory Given   Pulmonary Current SmokerPatient did not abstain from smoking.   Pulmonary exam normal        Cardiovascular hypertension, Pt. on medications Normal cardiovascular exam     Neuro/Psych negative neurological ROS  negative psych ROS   GI/Hepatic Neg liver ROS,GERD  Medicated and Controlled,,  Endo/Other   Obesity   Renal/GU negative Renal ROS     Musculoskeletal  (+) Arthritis ,    Abdominal  (+) + obese  Peds  Hematology negative hematology ROS (+)  Plt 309k    Anesthesia Other Findings   Reproductive/Obstetrics                             Anesthesia Physical Anesthesia Plan  ASA: 2  Anesthesia Plan: Spinal   Post-op Pain Management: Tylenol PO (pre-op)* and Celebrex PO (pre-op)*   Induction:   PONV Risk Score and Plan: 0 and Treatment may vary due to age or medical condition, Propofol infusion and Midazolam  Airway Management Planned: Natural Airway and Simple Face Mask  Additional Equipment: None  Intra-op Plan:   Post-operative Plan:   Informed Consent: I have reviewed the patients History and Physical, chart, labs and discussed the procedure including the risks, benefits and alternatives for the proposed anesthesia with the patient or authorized representative who has indicated his/her understanding and acceptance.       Plan Discussed with: CRNA and Anesthesiologist  Anesthesia Plan Comments: (Labs reviewed, platelets acceptable. Discussed risks and benefits of spinal, including spinal/epidural  hematoma, infection, failed block, and PDPH. Patient expressed understanding and wished to proceed. )       Anesthesia Quick Evaluation

## 2022-07-28 NOTE — H&P (Signed)
TOTAL HIP ADMISSION H&P  Patient is admitted for right total hip arthroplasty.  Subjective:  Chief Complaint: right hip pain  HPI: Jonathan Carney, 62 y.o. male, has a history of pain and functional disability in the right hip(s) due to arthritis and patient has failed non-surgical conservative treatments for greater than 12 weeks to include NSAID's and/or analgesics, corticosteriod injections, activity modification, and use of assistive device for ambulation .  Onset of symptoms was gradual starting 2 years ago with gradually worsening course since that time.The patient noted no past surgery on the right hip(s).  Patient currently rates pain in the right hip at 10 out of 10 with activity. Patient has night pain, worsening of pain with activity and weight bearing, pain that interfers with activities of daily living, and pain with passive range of motion. Patient has evidence of periarticular osteophytes and joint space narrowing by imaging studies. This condition presents safety issues increasing the risk of falls.  There is no current active infection.  Patient Active Problem List   Diagnosis Date Noted   Cellulitis of hand 05/29/2020   S/P cervical spinal fusion 04/08/2018   Past Medical History:  Diagnosis Date   Arthritis    Borderline high cholesterol    GERD (gastroesophageal reflux disease)    occais, no meds   Hypertension    Smoker     Past Surgical History:  Procedure Laterality Date   ANTERIOR CERVICAL DECOMP/DISCECTOMY FUSION N/A 04/08/2018   Procedure: Cervical Five-Six, Cervical Six-Seven Anterior cervical decompression/discectomy/fusion;  Surgeon: Tia Alert, MD;  Location: [facility] OR;  Service: Neurosurgery;  Laterality: N/A;  anterior   EYE SURGERY  to straighten it out   Right   I & D EXTREMITY Right 05/30/2020   Procedure: IRRIGATION AND DEBRIDEMENT OF RIGHT SMALL  FINGER;  Surgeon: Ernest Mallick, MD;  Location: [facility] OR;  Service: Orthopedics;  Laterality: Right;    INCISION AND DRAINAGE OF WOUND Right 06/17/2020   Procedure: Right small finger irrigation and debridement, application of integra;  Surgeon: Ernest Mallick, MD;  Location: [facility];  Service: Orthopedics;  Laterality: Right;  5TH   TOOTH EXTRACTION N/A 06/05/2022   Procedure: EXTRACTION TEETH NUMBER THREE, FOUR, FIVE, SIX, SEVEN, EIGHT, NINE, TEN, ELEVEN, FIFTEEN, SIXTEEN, TWENTY, TWENTY-ONE, TWENTY-TWO, TWENTY-THREE, TWENTY-SEVEN, THIRTY TWO, ALVEOLOPLASTY, AND REMOVAL OF BILATERAL LINGUAL TORI;  Surgeon: Ocie Doyne, DMD;  Location: [facility] OR;  Service: Oral Surgery;  Laterality: N/A;    Current Outpatient Medications  Medication Sig Dispense Refill Last Dose   amLODipine (NORVASC) 5 MG tablet Take 5 mg by mouth daily.      amoxicillin (AMOXIL) 500 MG capsule Take 1 capsule (500 mg total) by mouth 3 (three) times daily. (Patient not taking: Reported on 07/14/2022) 21 capsule 0    Aspirin-Acetaminophen-Caffeine (GOODY HEADACHE PO) Take 1 packet by mouth daily as needed (pain).      atorvastatin (LIPITOR) 40 MG tablet Take 40 mg by mouth at bedtime.      caffeine 200 MG TABS tablet Take 200 mg by mouth daily as needed (fatigue).      cyclobenzaprine (FLEXERIL) 10 MG tablet Take 10 mg by mouth at bedtime.      meloxicam (MOBIC) 7.5 MG tablet Take 7.5 mg by mouth 2 (two) times daily.      omeprazole (PRILOSEC) 40 MG capsule Take 40 mg by mouth daily as needed (Heartburn).      oxybutynin (DITROPAN) 5 MG tablet Take 10 mg by  mouth at bedtime.      oxyCODONE-acetaminophen (PERCOCET) 5-325 MG tablet Take 1 tablet by mouth every 6 (six) hours as needed for severe pain. (Patient not taking: Reported on 07/14/2022) 30 tablet 0    No current facility-administered medications for this visit.   No Known Allergies  Social History   Tobacco Use   Smoking status: Every Day    Packs/day: .5    Types: Cigarettes   Smokeless tobacco: Never  Substance Use Topics   Alcohol use: No     Family History  Problem Relation Age of Onset   Healthy Mother    Cancer Father      Review of Systems  Musculoskeletal:  Positive for arthralgias.  All other systems reviewed and are negative.   Objective:  Physical Exam Constitutional:      General: He is not in acute distress.    Appearance: Normal appearance. He is normal weight.  HENT:     Head: Normocephalic and atraumatic.  Eyes:     Extraocular Movements: Extraocular movements intact.     Conjunctiva/sclera: Conjunctivae normal.     Pupils: Pupils are equal, round, and reactive to light.  Cardiovascular:     Rate and Rhythm: Normal rate and regular rhythm.     Pulses: Normal pulses.     Heart sounds: Normal heart sounds.  Pulmonary:     Effort: Pulmonary effort is normal. No respiratory distress.     Breath sounds: Normal breath sounds.  Abdominal:     General: Bowel sounds are normal. There is no distension.     Palpations: Abdomen is soft.     Tenderness: There is no abdominal tenderness.  Musculoskeletal:        General: Tenderness present.     Cervical back: Normal range of motion and neck supple.     Comments: TTP over lateral aspect of hip and groin.  Reduced ROM in all directions due to pain.  Mild RLE weakness due to pain.  BLE appear grossly neurovascularly intact.  No lesion over area of chief complaint.  No swelling or erythema of right hip.  Lumbar/SI nonTTP.  Gait mildly antalgic.  Lymphadenopathy:     Cervical: No cervical adenopathy.  Skin:    General: Skin is warm and dry.     Capillary Refill: Capillary refill takes less than 2 seconds.     Findings: No erythema or rash.  Neurological:     General: No focal deficit present.     Mental Status: He is alert and oriented to person, place, and time.  Psychiatric:        Mood and Affect: Mood normal.        Behavior: Behavior normal.     Vital signs in last 24 hours: @VSRANGES @  Labs:   Estimated body mass index is 35.26 kg/m as  calculated from the following:   Height as of 06/05/22: 6' (1.829 m).   Weight as of 06/05/22: 117.9 kg.   Imaging Review Plain radiographs demonstrate severe degenerative joint disease of the right hip(s). The bone quality appears to be good for age and reported activity level.      Assessment/Plan:  End stage arthritis, right hip(s)  The patient history, physical examination, clinical judgement of the provider and imaging studies are consistent with end stage degenerative joint disease of the right hip(s) and total hip arthroplasty is deemed medically necessary. The treatment options including medical management, injection therapy, arthroscopy and arthroplasty were discussed at length. The risks  and benefits of total hip arthroplasty were presented and reviewed. The risks due to aseptic loosening, infection, stiffness, dislocation/subluxation,  thromboembolic complications and other imponderables were discussed.  The patient acknowledged the explanation, agreed to proceed with the plan and consent was signed. Patient is being admitted for inpatient treatment for surgery, pain control, PT, OT, prophylactic antibiotics, VTE prophylaxis, progressive ambulation and ADL's and discharge planning.The patient is planning to be discharged home with outpatient PT.

## 2022-07-29 ENCOUNTER — Other Ambulatory Visit: Payer: Self-pay

## 2022-07-29 ENCOUNTER — Ambulatory Visit (HOSPITAL_COMMUNITY): Payer: Medicaid Other | Admitting: Anesthesiology

## 2022-07-29 ENCOUNTER — Encounter (HOSPITAL_COMMUNITY)
Admission: RE | Disposition: A | Discharge status: Home / Self Care | Payer: Self-pay | Source: Ambulatory Visit | Attending: Orthopedic Surgery

## 2022-07-29 ENCOUNTER — Ambulatory Visit (HOSPITAL_BASED_OUTPATIENT_CLINIC_OR_DEPARTMENT_OTHER): Payer: Medicaid Other | Admitting: Anesthesiology

## 2022-07-29 ENCOUNTER — Ambulatory Visit (HOSPITAL_COMMUNITY): Payer: Medicaid Other

## 2022-07-29 ENCOUNTER — Ambulatory Visit (HOSPITAL_COMMUNITY)
Admission: RE | Admit: 2022-07-29 | Discharge: 2022-07-29 | Disposition: A | Discharge status: Home / Self Care | Payer: Medicaid Other | Source: Ambulatory Visit | Attending: Orthopedic Surgery | Admitting: Orthopedic Surgery

## 2022-07-29 ENCOUNTER — Encounter (HOSPITAL_COMMUNITY): Payer: Self-pay | Admitting: Orthopedic Surgery

## 2022-07-29 DIAGNOSIS — I10 Essential (primary) hypertension: Secondary | ICD-10-CM | POA: Diagnosis not present

## 2022-07-29 DIAGNOSIS — M1611 Unilateral primary osteoarthritis, right hip: Secondary | ICD-10-CM | POA: Insufficient documentation

## 2022-07-29 DIAGNOSIS — Z6835 Body mass index (BMI) 35.0-35.9, adult: Secondary | ICD-10-CM | POA: Insufficient documentation

## 2022-07-29 DIAGNOSIS — G8929 Other chronic pain: Secondary | ICD-10-CM

## 2022-07-29 DIAGNOSIS — F17210 Nicotine dependence, cigarettes, uncomplicated: Secondary | ICD-10-CM | POA: Insufficient documentation

## 2022-07-29 DIAGNOSIS — E669 Obesity, unspecified: Secondary | ICD-10-CM | POA: Diagnosis not present

## 2022-07-29 DIAGNOSIS — K219 Gastro-esophageal reflux disease without esophagitis: Secondary | ICD-10-CM | POA: Diagnosis not present

## 2022-07-29 HISTORY — PX: TOTAL HIP ARTHROPLASTY: SHX124

## 2022-07-29 LAB — CBC WITH DIFFERENTIAL/PLATELET
Abs Immature Granulocytes: 0.02 10*3/uL (ref 0.00–0.07)
Basophils Absolute: 0.1 10*3/uL (ref 0.0–0.1)
Basophils Relative: 1 %
Eosinophils Absolute: 0.2 10*3/uL (ref 0.0–0.5)
Eosinophils Relative: 4 %
HCT: 39.5 % (ref 39.0–52.0)
Hemoglobin: 13.1 g/dL (ref 13.0–17.0)
Immature Granulocytes: 0 %
Lymphocytes Relative: 27 %
Lymphs Abs: 1.5 10*3/uL (ref 0.7–4.0)
MCH: 31 pg (ref 26.0–34.0)
MCHC: 33.2 g/dL (ref 30.0–36.0)
MCV: 93.6 fL (ref 80.0–100.0)
Monocytes Absolute: 0.7 10*3/uL (ref 0.1–1.0)
Monocytes Relative: 12 %
Neutro Abs: 3 10*3/uL (ref 1.7–7.7)
Neutrophils Relative %: 56 %
Platelets: 356 10*3/uL (ref 150–400)
RBC: 4.22 MIL/uL (ref 4.22–5.81)
RDW: 13.8 % (ref 11.5–15.5)
WBC: 5.4 10*3/uL (ref 4.0–10.5)
nRBC: 0 % (ref 0.0–0.2)

## 2022-07-29 LAB — COMPREHENSIVE METABOLIC PANEL
ALT: 20 U/L (ref 0–44)
AST: 34 U/L (ref 15–41)
Albumin: 3.9 g/dL (ref 3.5–5.0)
Alkaline Phosphatase: 68 U/L (ref 38–126)
Anion gap: 10 (ref 5–15)
BUN: 12 mg/dL (ref 6–20)
CO2: 21 mmol/L — ABNORMAL LOW (ref 22–32)
Calcium: 8.5 mg/dL — ABNORMAL LOW (ref 8.9–10.3)
Chloride: 107 mmol/L (ref 98–111)
Creatinine, Ser: 1.31 mg/dL — ABNORMAL HIGH (ref 0.61–1.24)
GFR, Estimated: >60 mL/min (ref >60)
Glucose, Bld: 131 mg/dL — ABNORMAL HIGH (ref 70–99)
Potassium: 3.6 mmol/L (ref 3.5–5.1)
Sodium: 138 mmol/L (ref 135–145)
Total Bilirubin: 0.4 mg/dL (ref 0.3–1.2)
Total Protein: 7.3 g/dL (ref 6.5–8.1)

## 2022-07-29 LAB — TYPE AND SCREEN
ABO/RH(D): O POS
Antibody Screen: NEGATIVE

## 2022-07-29 SURGERY — ARTHROPLASTY, HIP, TOTAL,POSTERIOR APPROACH
Anesthesia: Spinal | Site: Hip | Laterality: Right

## 2022-07-29 MED ORDER — ONDANSETRON HCL 4 MG PO TABS: 4.0000 mg | ORAL_TABLET | Freq: Three times a day (TID) | ORAL | 0 refills | Status: AC | PRN

## 2022-07-29 MED ORDER — KETOROLAC TROMETHAMINE 15 MG/ML IJ SOLN
15.0000 mg | Freq: Four times a day (QID) | INTRAMUSCULAR | Status: DC
  Administered 2022-07-29: 15 mg via INTRAVENOUS

## 2022-07-29 MED ORDER — KETAMINE HCL 10 MG/ML IJ SOLN
INTRAMUSCULAR | Status: DC | PRN
  Administered 2022-07-29: 20 mg via INTRAVENOUS

## 2022-07-29 MED ORDER — KETOROLAC TROMETHAMINE 15 MG/ML IJ SOLN
INTRAMUSCULAR | Status: AC
  Filled 2022-07-29: qty 1

## 2022-07-29 MED ORDER — METHOCARBAMOL 500 MG IVPB - SIMPLE MED: 500.0000 mg | Freq: Four times a day (QID) | INTRAVENOUS | Status: DC | PRN

## 2022-07-29 MED ORDER — DEXAMETHASONE SODIUM PHOSPHATE 4 MG/ML IJ SOLN
INTRAMUSCULAR | Status: DC | PRN
  Administered 2022-07-29: 10 mg via INTRAVENOUS

## 2022-07-29 MED ORDER — LACTATED RINGERS IV BOLUS
500.0000 mL | Freq: Once | INTRAVENOUS | Status: AC
  Administered 2022-07-29: 500 mL via INTRAVENOUS

## 2022-07-29 MED ORDER — ORAL CARE MOUTH RINSE: 15.0000 mL | Freq: Once | OROMUCOSAL | Status: AC

## 2022-07-29 MED ORDER — KETOROLAC TROMETHAMINE 15 MG/ML IJ SOLN: 15.0000 mg | Freq: Four times a day (QID) | INTRAMUSCULAR | Status: DC

## 2022-07-29 MED ORDER — MIDAZOLAM HCL 5 MG/5ML IJ SOLN
INTRAMUSCULAR | Status: DC | PRN
  Administered 2022-07-29: 2 mg via INTRAVENOUS

## 2022-07-29 MED ORDER — METHOCARBAMOL 500 MG PO TABS: 500.0000 mg | ORAL_TABLET | Freq: Four times a day (QID) | ORAL | Status: DC | PRN

## 2022-07-29 MED ORDER — ACETAMINOPHEN 500 MG PO TABS
1000.0000 mg | ORAL_TABLET | Freq: Four times a day (QID) | ORAL | Status: DC
  Administered 2022-07-29: 1000 mg via ORAL

## 2022-07-29 MED ORDER — FENTANYL CITRATE (PF) 100 MCG/2ML IJ SOLN
INTRAMUSCULAR | Status: DC | PRN
  Administered 2022-07-29: 100 ug via INTRAVENOUS

## 2022-07-29 MED ORDER — PROPOFOL 1000 MG/100ML IV EMUL
INTRAVENOUS | Status: AC
  Filled 2022-07-29: qty 100

## 2022-07-29 MED ORDER — LACTATED RINGERS IV SOLN: INTRAVENOUS | Status: DC

## 2022-07-29 MED ORDER — CHLORHEXIDINE GLUCONATE 0.12 % MT SOLN
15.0000 mL | Freq: Once | OROMUCOSAL | Status: AC
  Administered 2022-07-29: 15 mL via OROMUCOSAL

## 2022-07-29 MED ORDER — ONDANSETRON HCL 4 MG/2ML IJ SOLN
INTRAMUSCULAR | Status: AC
  Filled 2022-07-29: qty 2

## 2022-07-29 MED ORDER — PROMETHAZINE HCL 25 MG/ML IJ SOLN: 6.2500 mg | INTRAMUSCULAR | Status: DC | PRN

## 2022-07-29 MED ORDER — ISOPROPYL ALCOHOL 70 % SOLN
Status: DC | PRN
  Administered 2022-07-29: 1 via TOPICAL

## 2022-07-29 MED ORDER — ONDANSETRON HCL 4 MG PO TABS: 4.0000 mg | ORAL_TABLET | Freq: Four times a day (QID) | ORAL | Status: DC | PRN

## 2022-07-29 MED ORDER — TRANEXAMIC ACID-NACL 1000-0.7 MG/100ML-% IV SOLN
1000.0000 mg | INTRAVENOUS | Status: AC
  Administered 2022-07-29: 1000 mg via INTRAVENOUS
  Filled 2022-07-29: qty 100

## 2022-07-29 MED ORDER — MIDAZOLAM HCL 2 MG/2ML IJ SOLN
INTRAMUSCULAR | Status: AC
  Filled 2022-07-29: qty 2

## 2022-07-29 MED ORDER — PHENYLEPHRINE HCL-NACL 20-0.9 MG/250ML-% IV SOLN
INTRAVENOUS | Status: AC
  Filled 2022-07-29: qty 250

## 2022-07-29 MED ORDER — OXYCODONE HCL 5 MG PO TABS: 5.0000 mg | ORAL_TABLET | Freq: Once | ORAL | Status: DC | PRN

## 2022-07-29 MED ORDER — BUPIVACAINE IN DEXTROSE 0.75-8.25 % IT SOLN
INTRATHECAL | Status: DC | PRN
  Administered 2022-07-29: 1.8 mL via INTRATHECAL

## 2022-07-29 MED ORDER — SODIUM CHLORIDE 0.9 % IR SOLN
Status: DC | PRN
  Administered 2022-07-29: 1000 mL

## 2022-07-29 MED ORDER — CEFAZOLIN SODIUM-DEXTROSE 2-4 GM/100ML-% IV SOLN
2.0000 g | INTRAVENOUS | Status: AC
  Administered 2022-07-29: 2 g via INTRAVENOUS
  Filled 2022-07-29: qty 100

## 2022-07-29 MED ORDER — DEXAMETHASONE SODIUM PHOSPHATE 10 MG/ML IJ SOLN: 8.0000 mg | Freq: Once | INTRAMUSCULAR | Status: DC

## 2022-07-29 MED ORDER — KETAMINE HCL 50 MG/5ML IJ SOSY
PREFILLED_SYRINGE | INTRAMUSCULAR | Status: AC
  Filled 2022-07-29: qty 5

## 2022-07-29 MED ORDER — BUPIVACAINE LIPOSOME 1.3 % IJ SUSP
INTRAMUSCULAR | Status: AC
  Filled 2022-07-29: qty 20

## 2022-07-29 MED ORDER — ACETAMINOPHEN 500 MG PO TABS: 1000.0000 mg | ORAL_TABLET | Freq: Three times a day (TID) | ORAL | 0 refills | Status: AC | PRN

## 2022-07-29 MED ORDER — CEFAZOLIN SODIUM-DEXTROSE 2-4 GM/100ML-% IV SOLN: 2.0000 g | Freq: Four times a day (QID) | INTRAVENOUS | Status: DC

## 2022-07-29 MED ORDER — PHENYLEPHRINE 80 MCG/ML (10ML) SYRINGE FOR IV PUSH (FOR BLOOD PRESSURE SUPPORT)
PREFILLED_SYRINGE | INTRAVENOUS | Status: AC
  Filled 2022-07-29: qty 10

## 2022-07-29 MED ORDER — SODIUM CHLORIDE 0.9 % IV SOLN: INTRAVENOUS | Status: DC

## 2022-07-29 MED ORDER — LACTATED RINGERS IV BOLUS
250.0000 mL | Freq: Once | INTRAVENOUS | Status: AC
  Administered 2022-07-29: 250 mL via INTRAVENOUS

## 2022-07-29 MED ORDER — ACETAMINOPHEN 500 MG PO TABS: 1000.0000 mg | ORAL_TABLET | Freq: Once | ORAL | Status: DC

## 2022-07-29 MED ORDER — ONDANSETRON HCL 4 MG/2ML IJ SOLN: 4.0000 mg | Freq: Four times a day (QID) | INTRAMUSCULAR | Status: DC | PRN

## 2022-07-29 MED ORDER — OXYCODONE HCL 5 MG PO TABS
5.0000 mg | ORAL_TABLET | ORAL | Status: DC | PRN
  Administered 2022-07-29: 5 mg via ORAL

## 2022-07-29 MED ORDER — OXYCODONE HCL 5 MG PO TABS
ORAL_TABLET | ORAL | Status: AC
  Filled 2022-07-29: qty 1

## 2022-07-29 MED ORDER — FENTANYL CITRATE (PF) 100 MCG/2ML IJ SOLN
INTRAMUSCULAR | Status: AC
  Filled 2022-07-29: qty 2

## 2022-07-29 MED ORDER — SODIUM CHLORIDE (PF) 0.9 % IJ SOLN
INTRAMUSCULAR | Status: DC | PRN
  Administered 2022-07-29: 60 mL

## 2022-07-29 MED ORDER — HYDROMORPHONE HCL 1 MG/ML IJ SOLN: 0.5000 mg | INTRAMUSCULAR | Status: DC | PRN

## 2022-07-29 MED ORDER — POVIDONE-IODINE 10 % EX SWAB: 2.0000 | Freq: Once | CUTANEOUS | Status: DC

## 2022-07-29 MED ORDER — ACETAMINOPHEN 325 MG PO TABS: 325.0000 mg | ORAL_TABLET | Freq: Four times a day (QID) | ORAL | Status: DC | PRN

## 2022-07-29 MED ORDER — SODIUM CHLORIDE (PF) 0.9 % IJ SOLN
INTRAMUSCULAR | Status: AC
  Filled 2022-07-29: qty 50

## 2022-07-29 MED ORDER — MELOXICAM 7.5 MG PO TABS: 7.5000 mg | ORAL_TABLET | Freq: Every day | ORAL | 1 refills | Status: DC

## 2022-07-29 MED ORDER — GLYCOPYRROLATE 0.2 MG/ML IJ SOLN
INTRAMUSCULAR | Status: AC
  Filled 2022-07-29: qty 1

## 2022-07-29 MED ORDER — PROPOFOL 500 MG/50ML IV EMUL
INTRAVENOUS | Status: DC | PRN
  Administered 2022-07-29: 150 ug/kg/min via INTRAVENOUS

## 2022-07-29 MED ORDER — ISOPROPYL ALCOHOL 70 % SOLN
Status: AC
  Filled 2022-07-29: qty 480

## 2022-07-29 MED ORDER — OXYCODONE HCL 5 MG/5ML PO SOLN: 5.0000 mg | Freq: Once | ORAL | Status: DC | PRN

## 2022-07-29 MED ORDER — CEFAZOLIN SODIUM-DEXTROSE 2-4 GM/100ML-% IV SOLN
INTRAVENOUS | Status: AC
  Administered 2022-07-29: 2 g via INTRAVENOUS
  Filled 2022-07-29: qty 100

## 2022-07-29 MED ORDER — ACETAMINOPHEN 500 MG PO TABS
ORAL_TABLET | ORAL | Status: AC
  Filled 2022-07-29: qty 2

## 2022-07-29 MED ORDER — ASPIRIN 81 MG PO TBEC: 81.0000 mg | DELAYED_RELEASE_TABLET | Freq: Two times a day (BID) | ORAL | 0 refills | Status: AC

## 2022-07-29 MED ORDER — FENTANYL CITRATE PF 50 MCG/ML IJ SOSY: 25.0000 ug | PREFILLED_SYRINGE | INTRAMUSCULAR | Status: DC | PRN

## 2022-07-29 MED ORDER — SODIUM CHLORIDE (PF) 0.9 % IJ SOLN
INTRAMUSCULAR | Status: AC
  Filled 2022-07-29: qty 10

## 2022-07-29 MED ORDER — ACETAMINOPHEN 500 MG PO TABS
1000.0000 mg | ORAL_TABLET | Freq: Once | ORAL | Status: AC
  Administered 2022-07-29: 1000 mg via ORAL
  Filled 2022-07-29: qty 2

## 2022-07-29 MED ORDER — WATER FOR IRRIGATION, STERILE IR SOLN
Status: DC | PRN
  Administered 2022-07-29: 2000 mL

## 2022-07-29 MED ORDER — PHENYLEPHRINE HCL (PRESSORS) 10 MG/ML IV SOLN
INTRAVENOUS | Status: DC | PRN
  Administered 2022-07-29 (×4): 160 ug via INTRAVENOUS

## 2022-07-29 MED ORDER — OXYCODONE HCL 5 MG PO TABS: 5.0000 mg | ORAL_TABLET | ORAL | 0 refills | Status: AC | PRN

## 2022-07-29 MED ORDER — BUPIVACAINE LIPOSOME 1.3 % IJ SUSP
INTRAMUSCULAR | Status: DC | PRN
  Administered 2022-07-29: 20 mL

## 2022-07-29 MED ORDER — SODIUM CHLORIDE 0.9 % IR SOLN
Status: DC | PRN
  Administered 2022-07-29: 3000 mL

## 2022-07-29 MED ORDER — BUPIVACAINE LIPOSOME 1.3 % IJ SUSP: 10.0000 mL | Freq: Once | INTRAMUSCULAR | Status: DC

## 2022-07-29 MED ORDER — GLYCOPYRROLATE 0.2 MG/ML IJ SOLN
INTRAMUSCULAR | Status: DC | PRN
  Administered 2022-07-29: .2 mg via INTRAVENOUS

## 2022-07-29 MED ORDER — PHENYLEPHRINE HCL-NACL 20-0.9 MG/250ML-% IV SOLN
INTRAVENOUS | Status: DC | PRN
  Administered 2022-07-29: 25 ug/min via INTRAVENOUS

## 2022-07-29 MED ORDER — 0.9 % SODIUM CHLORIDE (POUR BTL) OPTIME
TOPICAL | Status: DC | PRN
  Administered 2022-07-29: 1000 mL

## 2022-07-29 MED ORDER — CYCLOBENZAPRINE HCL 5 MG PO TABS: 10.0000 mg | ORAL_TABLET | Freq: Three times a day (TID) | ORAL | 0 refills | Status: AC | PRN

## 2022-07-29 MED ORDER — DEXAMETHASONE SODIUM PHOSPHATE 10 MG/ML IJ SOLN
INTRAMUSCULAR | Status: AC
  Filled 2022-07-29: qty 1

## 2022-07-29 MED ORDER — ONDANSETRON HCL 4 MG/2ML IJ SOLN
INTRAMUSCULAR | Status: DC | PRN
  Administered 2022-07-29: 4 mg via INTRAVENOUS

## 2022-07-29 MED ORDER — CELECOXIB 200 MG PO CAPS
200.0000 mg | ORAL_CAPSULE | Freq: Once | ORAL | Status: AC
  Administered 2022-07-29: 200 mg via ORAL
  Filled 2022-07-29: qty 1

## 2022-07-29 SURGICAL SUPPLY — 72 items
ADH SKN CLS APL DERMABOND .7 (GAUZE/BANDAGES/DRESSINGS) ×1
APL PRP STRL LF DISP 70% ISPRP (MISCELLANEOUS) ×2
BAG COUNTER SPONGE SURGICOUNT (BAG) IMPLANT
BAG DECANTER FOR FLEXI CONT (MISCELLANEOUS) ×2 IMPLANT
BAG SPEC THK2 15X12 ZIP CLS (MISCELLANEOUS) ×1
BAG SPNG CNTER NS LX DISP (BAG)
BAG ZIPLOCK 12X15 (MISCELLANEOUS) ×2 IMPLANT
BLADE SAW SAG 25X90X1.19 (BLADE) ×2 IMPLANT
CHLORAPREP W/TINT 26 (MISCELLANEOUS) ×4 IMPLANT
COVER SURGICAL LIGHT HANDLE (MISCELLANEOUS) ×2 IMPLANT
DERMABOND ADVANCED .7 DNX12 (GAUZE/BANDAGES/DRESSINGS) ×2 IMPLANT
DRAPE HIP W/POCKET STRL (MISCELLANEOUS) ×2 IMPLANT
DRAPE INCISE IOBAN 66X45 STRL (DRAPES) ×2 IMPLANT
DRAPE INCISE IOBAN 85X60 (DRAPES) ×2 IMPLANT
DRAPE POUCH INSTRU U-SHP 10X18 (DRAPES) ×2 IMPLANT
DRAPE SHEET LG 3/4 BI-LAMINATE (DRAPES) ×6 IMPLANT
DRAPE SURG 17X11 SM STRL (DRAPES) ×2 IMPLANT
DRAPE U-SHAPE 47X51 STRL (DRAPES) ×4 IMPLANT
DRESSING AQUACEL AG SP 3.5X10 (GAUZE/BANDAGES/DRESSINGS) ×2 IMPLANT
DRSG AQUACEL AG ADV 3.5X10 (GAUZE/BANDAGES/DRESSINGS) IMPLANT
DRSG AQUACEL AG SP 3.5X10 (GAUZE/BANDAGES/DRESSINGS) ×1
ELECT BLADE TIP CTD 4 INCH (ELECTRODE) ×2 IMPLANT
ELECT REM PT RETURN 15FT ADLT (MISCELLANEOUS) ×2 IMPLANT
GLOVE BIO SURGEON STRL SZ 6.5 (GLOVE) ×4 IMPLANT
GLOVE BIOGEL PI IND STRL 6.5 (GLOVE) ×2 IMPLANT
GLOVE BIOGEL PI IND STRL 8 (GLOVE) ×2 IMPLANT
GLOVE SURG ORTHO 8.0 STRL STRW (GLOVE) ×4 IMPLANT
GOWN STRL REUS W/ TWL XL LVL3 (GOWN DISPOSABLE) ×4 IMPLANT
GOWN STRL REUS W/TWL XL LVL3 (GOWN DISPOSABLE) ×2
HANDPIECE INTERPULSE COAX TIP (DISPOSABLE) ×1
HEAD FEMORAL HIP (Head) IMPLANT
HOLDER FOLEY CATH W/STRAP (MISCELLANEOUS) ×2 IMPLANT
HOOD PEEL AWAY T7 (MISCELLANEOUS) ×6 IMPLANT
INSERT 0 DEG POLY  36 F (Miscellaneous) ×1 IMPLANT
INSERT 0 DEG POLY 36 F (Miscellaneous) IMPLANT
JET LAVAGE IRRISEPT WOUND (IRRIGATION / IRRIGATOR)
KIT BASIN OR (CUSTOM PROCEDURE TRAY) ×2 IMPLANT
KIT TURNOVER KIT A (KITS) IMPLANT
LAVAGE JET IRRISEPT WOUND (IRRIGATION / IRRIGATOR) IMPLANT
MANIFOLD NEPTUNE II (INSTRUMENTS) ×2 IMPLANT
MARKER SKIN DUAL TIP RULER LAB (MISCELLANEOUS) ×2 IMPLANT
NDL HYPO 22X1.5 SAFETY MO (MISCELLANEOUS) IMPLANT
NEEDLE HYPO 22X1.5 SAFETY MO (MISCELLANEOUS) ×1 IMPLANT
NS IRRIG 1000ML POUR BTL (IV SOLUTION) ×2 IMPLANT
PACK TOTAL JOINT (CUSTOM PROCEDURE TRAY) ×2 IMPLANT
PRESSURIZER FEMORAL UNIV (MISCELLANEOUS) IMPLANT
PROTECTOR NERVE ULNAR (MISCELLANEOUS) ×2 IMPLANT
RETRIEVER SUT HEWSON (MISCELLANEOUS) ×2 IMPLANT
SCREW HEX LP 6.5X25 (Screw) IMPLANT
SCREW HEX LP 6.5X30 (Screw) IMPLANT
SEALER BIPOLAR AQUA 6.0 (INSTRUMENTS) IMPLANT
SET HNDPC FAN SPRY TIP SCT (DISPOSABLE) IMPLANT
SHELL TRIDENT II CLUST SZ 56MM (Shell) IMPLANT
SPIKE FLUID TRANSFER (MISCELLANEOUS) ×6 IMPLANT
STEM HIP 127 DEG (Stem) IMPLANT
SUCTION FRAZIER HANDLE 12FR (TUBING) ×1
SUCTION TUBE FRAZIER 12FR DISP (TUBING) ×2 IMPLANT
SUT BONE WAX W31G (SUTURE) ×2 IMPLANT
SUT ETHIBOND #5 BRAIDED 30INL (SUTURE) ×2 IMPLANT
SUT MNCRL AB 3-0 PS2 18 (SUTURE) ×2 IMPLANT
SUT STRATAFIX 0 PDS 27 VIOLET (SUTURE) ×1
SUT STRATAFIX PDO 1 14 VIOLET (SUTURE) ×1
SUT STRATFX PDO 1 14 VIOLET (SUTURE) ×1
SUT VIC AB 2-0 CT2 27 (SUTURE) ×4 IMPLANT
SUTURE STRATFX 0 PDS 27 VIOLET (SUTURE) ×2 IMPLANT
SUTURE STRATFX PDO 1 14 VIOLET (SUTURE) ×2 IMPLANT
SYR 20ML LL LF (SYRINGE) ×4 IMPLANT
TOWEL OR 17X26 10 PK STRL BLUE (TOWEL DISPOSABLE) ×2 IMPLANT
TRAY FOLEY MTR SLVR 16FR STAT (SET/KITS/TRAYS/PACK) ×2 IMPLANT
TUBE SUCTION HIGH CAP CLEAR NV (SUCTIONS) ×2 IMPLANT
UNDERPAD 30X36 HEAVY ABSORB (UNDERPADS AND DIAPERS) ×2 IMPLANT
WATER STERILE IRR 1000ML POUR (IV SOLUTION) ×4 IMPLANT

## 2022-07-29 NOTE — Transfer of Care (Signed)
Immediate Anesthesia Transfer of Care Note  Patient: Jonathan Carney  Procedure(s) Performed: TOTAL HIP ARTHROPLASTY (Right: Hip)  Patient Location: PACU  Anesthesia Type:Spinal  Level of Consciousness: awake, alert , oriented, and patient cooperative  Airway & Oxygen Therapy: Patient Spontanous Breathing and Patient connected to face mask oxygen  Post-op Assessment: Report given to RN, Post -op Vital signs reviewed and stable, and Patient moving all extremities X 4  Post vital signs: Reviewed and stable  Last Vitals:  Vitals Value Taken Time  BP 136/84 07/29/22 0946  Temp    Pulse 77 07/29/22 0949  Resp    SpO2 99 % 07/29/22 0949  Vitals shown include unvalidated device data.  Last Pain:  Vitals:   07/29/22 0631  TempSrc: Oral  PainSc:          Complications: No notable events documented.

## 2022-07-29 NOTE — Discharge Instructions (Signed)

## 2022-07-29 NOTE — Interval H&P Note (Signed)
The patient has been re-examined, and the chart reviewed, and there have been no interval changes to the documented history and physical.    Plan for R THA for R hip OA  The operative side was examined and the patient was confirmed to have sensation to DPN, SPN, TN intact, Motor EHL, ext, flex 5/5, and DP 2+, PT 2+, No significant edema.   The risks, benefits, and alternatives have been discussed at length with patient, and the patient is willing to proceed.  Right hip marked. Consent has been signed.  

## 2022-07-29 NOTE — Anesthesia Postprocedure Evaluation (Signed)
Anesthesia Post Note  Patient: Jonathan Carney  Procedure(s) Performed: TOTAL HIP ARTHROPLASTY (Right: Hip)     Patient location during evaluation: PACU Anesthesia Type: Spinal Level of consciousness: awake and alert Pain management: pain level controlled Vital Signs Assessment: post-procedure vital signs reviewed and stable Respiratory status: spontaneous breathing and respiratory function stable Cardiovascular status: blood pressure returned to baseline and stable Postop Assessment: spinal receding and no apparent nausea or vomiting Anesthetic complications: no   No notable events documented.  Last Vitals:  Vitals:   07/29/22 1015 07/29/22 1030  BP: 131/86 (!) 129/96  Pulse: 64 76  Resp: 20 20  Temp:    SpO2: 95% 95%    Last Pain:  Vitals:   07/29/22 1030  TempSrc:   PainSc: 0-No pain                 Beryle Lathe

## 2022-07-29 NOTE — Op Note (Addendum)
07/29/2022  9:08 AM  PATIENT:  Jonathan Carney   MRN: [id number]  PRE-OPERATIVE DIAGNOSIS: End-stage right hip osteoarthritis  POST-OPERATIVE DIAGNOSIS:  same  PROCEDURE:  Procedure(s): TOTAL HIP ARTHROPLASTY  PREOPERATIVE INDICATIONS:    [name] is an 62 y.o. male who has a diagnosis of end-stage right hip osteoarthritis and elected for surgical management after failing conservative treatment.  The risks benefits and alternatives were discussed with the patient including but not limited to the risks of nonoperative treatment, versus surgical intervention including infection, bleeding, nerve injury, periprosthetic fracture, the need for revision surgery, dislocation, leg length discrepancy, blood clots, cardiopulmonary complications, morbidity, mortality, among others, and they were willing to proceed.     OPERATIVE REPORT     SURGEON:  Weber Cooks, MD    ASSISTANT: Kathie Dike, PA-C, (Present throughout the entire procedure,  necessary for completion of procedure in a timely manner, assisting with retraction, instrumentation, and closure)     ANESTHESIA: Spinal  ESTIMATED BLOOD LOSS: 250cc    COMPLICATIONS:  None.     COMPONENTS:   Stryker Trident 256 mm acetabular shell, 6.5 hex screws x 2, Trident X3 neutral polyethylene liner, Accolade 2 with 127 neck angle size #5 stem, 36+72mm ceramic head Implant Name Type Inv. Item Serial No. Manufacturer Lot No. LRB No. Used Action  SHELL TRIDENT II CLUST SZ - [id number] Shell SHELL TRIDENT II CLUST SZ  [employer] [id number] A Right 1 Implanted  SCREW HEX LP 6.5X30 - [id number] Screw SCREW HEX LP 6.5X30  [employer] HBCA Right 1 Implanted  SCREW HEX LP 6.5X25 - [id number] Screw SCREW HEX LP 6.5X25  [employer] GPWA Right 1 Implanted  INSERT 0 DEG POLY  36 F - [id number] Miscellaneous INSERT 0 DEG POLY  36 F  [employer] [id number] Right 1 Implanted  STEM HIP 127 DEG - [id number] Stem STEM  HIP 127 DEG  [employer] [id number] A Right 1 Implanted  HEAD FEMORAL HIP - [id number] Head HEAD FEMORAL HIP  [employer] [id number] Right 1 Implanted    The aquamantis was utilized for this case to help facilitate better hemostasis as patient was felt to be at increased risk of bleeding because of obesity.        PROCEDURE IN DETAIL:   The patient was met in the holding area and  identified.  The appropriate hip was identified and marked at the operative site.  The patient was then transported to the OR  and  placed under anesthesia.  At that point, the patient was  placed in the lateral decubitus position with the operative side up and  secured to the operating room table  and all bony prominences padded. A subaxillary role was also placed.    The operative lower extremity was prepped from the iliac crest to the distal leg.  Sterile draping was performed.  Preoperative antibiotics, 2 gm of ancef,1 gm of Tranexamic Acid, and 8 mg of Decadron administered. Time out was performed prior to incision.      A routine posterolateral approach was utilized via sharp dissection  carried down to the subcutaneous tissue.  Gross bleeders were Bovie coagulated.  The iliotibial band was identified and incised along the length of the skin incision through the glute max fascia.  Charnley retractor was placed with care to protect the sciatic nerve posteriorly.  With the hip internally rotated, the piriformis tendon was identified and released from the femoral insertion and tagged with a #5 Ethibond.  A capsulotomy was  then performed off the femoral insertion and also tagged with a #5 Ethibond.    The femoral neck was exposed, and I resected the femoral neck based on preoperative templating relative to the lesser trochanter.    I then exposed the deep acetabulum, cleared out any tissue including the ligamentum teres.  After adequate visualization, I excised the labrum.  I then started reaming with a 50 mm  reamer, first medializing to the floor of the cotyloid fossa, and then in the position of the cup aiming towards the greater sciatic notch, matching the version of the transverse acetabular ligament and tucked under the anterior wall. I reamed up to 56 mm reamer with good bony bed preparation and a 56 mm cup was chosen.  The real cup was then impacted into place.  Appropriate version and inclination was confirmed clinically matching their bony anatomy, and also with the use of the jig.  I placed 2 screws in the posterior superior quadrant to augment fixation.  A neutral liner was placed and impacted. It was confirmed to be appropriately seated and the acetabular retractors were removed.    I then prepared the proximal femur using the box cutter, Charnley awl, and then sequentially broached starting with 0 up to a size 5.  A trial broach, neck, and head was utilized, and I reduced the hip and it was found to have excellent stability.  There was no impingement with full extension and 90 degrees external rotation.  The hip was stable at the position of sleep and with 90 degrees flexion and 80 degrees of internal rotation.  Leg lengths were also clinically assessed in the lateral position and felt to be equal. Intra-Op flatplate was obtained and confirmed appropriate component positions.  Good fill of the femur with the size 5 broach.  And restoration of leg length and offset. No evidence or concern for fracture.  A final femoral prosthesis size 5 was selected. I then impacted the real femoral prosthesis into place.I again trialed and selected a 36+ 5mm ball. The hip was then reduced and taken through a range of motion. There was no impingement with full extension and 90 degrees external rotation.  The hip was stable at the position of sleep and with 90 degrees flexion and 80 degrees of internal rotation. Leg lengths were  again assessed and felt to be restored.  We then opened, and I impacted the real head ball  into place.  The posterior capsule was then closed with #5 Ethibond.  The piriformis was repaired through the base of the abductor tendon using a [place] suture passer.  I then irrigated the hip copiously with dilute Betadine and with normal saline pulse lavage. Periarticular injection was then performed with Exparel.   We repaired the fascia #1 barbed suture, followed by 0 barbed suture for the subcutaneous fat.  Skin was closed with 2-0 Vicryl and 3-0 Monocryl.  Dermabond and Aquacel dressing were applied. The patient was then awakened and returned to PACU in stable and satisfactory condition.  Leg lengths in the supine position were assessed and felt to be clinically equal. There were no complications.  Post op recs: WB: WBAT RLE, No formal hip precautions Abx: ancef Imaging: PACU pelvis Xray Dressing: Aquacell, keep intact until follow up DVT prophylaxis: Aspirin 81BID starting POD1 Follow up: 2 weeks after surgery for a wound check with Dr. Blanchie Dessert at [facility].  Address: [address], [place], [place] [address]  Office Phone: [phone])  [phone]   Weber Cooks, MD Orthopedic Surgeon

## 2022-07-29 NOTE — Anesthesia Procedure Notes (Signed)
Spinal  Patient location during procedure: OR Start time: 07/29/2022 7:31 AM End time: 07/29/2022 7:34 AM Reason for block: surgical anesthesia Staffing Performed: anesthesiologist  Anesthesiologist: Beryle Lathe, MD Performed by: Beryle Lathe, MD Authorized by: Beryle Lathe, MD   Preanesthetic Checklist Completed: patient identified, IV checked, risks and benefits discussed, surgical consent, monitors and equipment checked, pre-op evaluation and timeout performed Spinal Block Patient position: sitting Prep: DuraPrep Patient monitoring: heart rate, cardiac monitor, continuous pulse ox and blood pressure Approach: midline Location: L3-4 Injection technique: single-shot Needle Needle type: Pencan  Needle gauge: 24 G Additional Notes Consent was obtained prior to the procedure with all questions answered and concerns addressed. Risks including, but not limited to, bleeding, infection, nerve damage, paralysis, failed block, inadequate analgesia, allergic reaction, high spinal, itching, and headache were discussed and the patient wished to proceed. Functioning IV was confirmed and monitors were applied. Sterile prep and drape, including hand hygiene, mask, and sterile gloves were used. The patient was positioned and the spine was prepped. The skin was anesthetized with lidocaine. Free flow of clear CSF was obtained prior to injecting local anesthetic into the CSF. The spinal needle aspirated freely following injection. The needle was carefully withdrawn. The patient tolerated the procedure well.   Leslye Peer, MD

## 2022-07-29 NOTE — Evaluation (Signed)
Physical Therapy Evaluation Patient Details Name: Jonathan Carney MRN: [id number] DOB: 12-16-1960 Today's Date: 07/29/2022  History of Present Illness  62 yo male presents to therapy s/p R THA, posterior lateral approach on 07/29/2022 due to failure of conservative measures. Pt is R LE WBAT and no formal hip precautions. Pt PMH includes but is not limited to: cervical spine fusion, HTN, and tobacco abuse.  Clinical Impression    Jonathan Carney is a 62 y.o. male POD 0 s/p T THA. Patient reports mod I with SPC for mobility at baseline. Patient is now limited by functional impairments (see PT problem list below) and requires min guard and cues for transfers and gait with RW. Patient was able to ambulate 50 cx 2 feet with RW and min guard and progressing to S and cues for safe walker management. Patient educated on safe sequencing for stair mobility, car transfers, pain management, use of CP/Ice, fall risk prevention, R LE placement and use of RW pt verbalized safe guarding position for people assisting with mobility. Patient instructed in exercises to facilitate ROM and circulation reviewed and HO provided. Patient will benefit from continued skilled PT interventions to address impairments and progress towards PLOF. Patient has met mobility goals at adequate level for discharge home; will continue to follow if pt continues acute stay to progress towards Mod I goals.      Recommendations for follow up therapy are one component of a multi-disciplinary discharge planning process, led by the attending physician.  Recommendations may be updated based on patient status, additional functional criteria and insurance authorization.  Follow Up Recommendations       Assistance Recommended at Discharge Intermittent Supervision/Assistance  Patient can return home with the following  A little help with walking and/or transfers;A little help with bathing/dressing/bathroom;Assistance with cooking/housework;Assist for  transportation;Help with stairs or ramp for entrance    Equipment Recommendations Rolling walker (2 wheels) (provided and adjusted at eval)  Recommendations for Other Services       Functional Status Assessment Patient has had a recent decline in their functional status and demonstrates the ability to make significant improvements in function in a reasonable and predictable amount of time.     Precautions / Restrictions Precautions Precautions: Fall Restrictions Weight Bearing Restrictions: No      Mobility  Bed Mobility Overal bed mobility: Needs Assistance Bed Mobility: Supine to Sit     Supine to sit: Supervision     General bed mobility comments: min cues and HOB elevated    Transfers Overall transfer level: Needs assistance Equipment used: Rolling walker (2 wheels) Transfers: Sit to/from Stand Sit to Stand: Min guard           General transfer comment: cues for safety and proper UE and AD placement    Ambulation/Gait Ambulation/Gait assistance: Min guard Gait Distance (Feet): 50 Feet Assistive device: Rolling walker (2 wheels) Gait Pattern/deviations: Step-to pattern, Trunk flexed Gait velocity: decreased     General Gait Details: step almost through pattern, cues for proper body placement inside RW, cues for proper sequuencing especially wtih retrograde stepping patterns. significant R toe in cues for attention to foot placement and improved anataomical positioning  Stairs Stairs: Yes Stairs assistance: Min guard Stair Management: Two rails Number of Stairs: 3 General stair comments: cues for safety, sequencing and technique pt making attepts for reciprocal pattern and encouraged for step to at this time  Wheelchair Mobility    Modified Rankin (Stroke Patients Only)       Balance  Overall balance assessment: Needs assistance Sitting-balance support: Feet supported Sitting balance-Leahy Scale: Good     Standing balance support: Bilateral  upper extremity supported, During functional activity, Reliant on assistive device for balance (no UE support with static standing) Standing balance-Leahy Scale: Fair                               Pertinent Vitals/Pain Pain Assessment Pain Assessment: 0-10 Pain Score: 3  Pain Location: R hip Pain Descriptors / Indicators: Aching, Constant, Discomfort, Operative site guarding Pain Intervention(s): Limited activity within patient's tolerance, Monitored during session, Premedicated before session, Repositioned, Ice applied    Home Living Family/patient expects to be discharged to:: Private residence Living Arrangements: Spouse/significant other Available Help at Discharge: Family Type of Home: House Home Access: Stairs to enter Entrance Stairs-Rails: Right;Left;Can reach both Secretary/administrator of Steps: 6   Home Layout: One level Home Equipment: Cane - single point      Prior Function Prior Level of Function : Independent/Modified Independent             Mobility Comments: mod I with SPC for all functional mobility tasks, ADLs, self care tasks, IADLs,       Hand Dominance        Extremity/Trunk Assessment        Lower Extremity Assessment Lower Extremity Assessment: RLE deficits/detail RLE Deficits / Details: ankle DF/PF 5/5 RLE Sensation: WNL    Cervical / Trunk Assessment Cervical / Trunk Assessment:  (wfl)  Communication   Communication: No difficulties  Cognition Arousal/Alertness: Awake/alert Behavior During Therapy: WFL for tasks assessed/performed Overall Cognitive Status: Within Functional Limits for tasks assessed                                          General Comments      Exercises Total Joint Exercises Ankle Circles/Pumps: AROM, Both, 20 reps Quad Sets: AROM, Right, 5 reps Heel Slides: AROM, Right, 5 reps Hip ABduction/ADduction: AROM, Right, 5 reps, Supine, Standing Long Arc Quad: AROM, Right, 5  reps Knee Flexion: AROM, Right, 5 reps, Standing Standing Hip Extension: AROM, Right, 5 reps   Assessment/Plan    PT Assessment Patient needs continued PT services  PT Problem List Decreased strength;Decreased range of motion;Decreased activity tolerance;Decreased balance;Decreased mobility;Decreased coordination;Pain       PT Treatment Interventions DME instruction;Gait training;Stair training;Functional mobility training;Therapeutic activities;Therapeutic exercise;Balance training;Neuromuscular re-education;Patient/family education;Modalities    PT Goals (Current goals can be found in the Care Plan section)  Acute Rehab PT Goals Patient Stated Goal: to be able to get in and out of the [place] [place] and go fishing PT Goal Formulation: With patient Time For Goal Achievement: 08/11/22 Potential to Achieve Goals: Good    Frequency 7X/week     Co-evaluation               AM-PAC PT "6 Clicks" Mobility  Outcome Measure Help needed turning from your back to your side while in a flat bed without using bedrails?: None Help needed moving from lying on your back to sitting on the side of a flat bed without using bedrails?: A Little Help needed moving to and from a bed to a chair (including a wheelchair)?: A Little Help needed standing up from a chair using your arms (e.g., wheelchair or bedside chair)?: A Little Help needed to  walk in hospital room?: A Little Help needed climbing 3-5 steps with a railing? : A Little 6 Click Score: 19    End of Session Equipment Utilized During Treatment: Gait belt Activity Tolerance: Patient tolerated treatment well;No increased pain Patient left: in chair;with call bell/phone within reach Nurse Communication: Mobility status;Other (comment) (readiness for d/c) PT Visit Diagnosis: Unsteadiness on feet (R26.81);Other abnormalities of gait and mobility (R26.89);Muscle weakness (generalized) (M62.81);Difficulty in walking, not elsewhere classified  (R26.2);Pain Pain - Right/Left: Right Pain - part of body: Hip    Time: 1350-1444 PT Time Calculation (min) (ACUTE ONLY): 54 min   Charges:   PT Evaluation $PT Eval Low Complexity: 1 Low PT Treatments $Gait Training: 8-22 mins $Therapeutic Exercise: 8-22 mins $Therapeutic Activity: 8-22 mins        Johnny Bridge, PT Acute Rehab   Jacqualyn Posey 07/29/2022, 2:56 PM

## 2022-07-31 ENCOUNTER — Encounter (HOSPITAL_COMMUNITY): Payer: Self-pay | Admitting: Orthopedic Surgery

## 2022-08-04 ENCOUNTER — Ambulatory Visit: Payer: Medicaid Other | Attending: Orthopedic Surgery | Admitting: Physical Therapy

## 2022-08-04 ENCOUNTER — Other Ambulatory Visit: Payer: Self-pay

## 2022-08-04 ENCOUNTER — Encounter: Payer: Self-pay | Admitting: Physical Therapy

## 2022-08-04 DIAGNOSIS — M6281 Muscle weakness (generalized): Secondary | ICD-10-CM | POA: Diagnosis present

## 2022-08-04 DIAGNOSIS — R2689 Other abnormalities of gait and mobility: Secondary | ICD-10-CM | POA: Insufficient documentation

## 2022-08-04 DIAGNOSIS — M25551 Pain in right hip: Secondary | ICD-10-CM | POA: Diagnosis present

## 2022-08-04 NOTE — Therapy (Signed)
OUTPATIENT PHYSICAL THERAPY LOWER EXTREMITY EVALUATION  Patient Name: Jonathan Carney MRN: [id number] DOB:11-27-1960, 62 y.o., male Today's Date: 08/04/2022   PT End of Session - 08/04/22 1212     Visit Number 1    Number of Visits --   1-2x/week   Date for PT Re-Evaluation 09/29/22    Authorization Type [employer] MCD    PT Start Time 1125    PT Stop Time 1205    PT Time Calculation (min) 40 min             Past Medical History:  Diagnosis Date   Arthritis    Borderline high cholesterol    GERD (gastroesophageal reflux disease)    occais, no meds   Hypertension    Smoker    Past Surgical History:  Procedure Laterality Date   ANTERIOR CERVICAL DECOMP/DISCECTOMY FUSION N/A 04/08/2018   Procedure: Cervical Five-Six, Cervical Six-Seven Anterior cervical decompression/discectomy/fusion;  Surgeon: Tia Alert, MD;  Location: [facility] OR;  Service: Neurosurgery;  Laterality: N/A;  anterior   EYE SURGERY  to straighten it out   Right   I & D EXTREMITY Right 05/30/2020   Procedure: IRRIGATION AND DEBRIDEMENT OF RIGHT SMALL  FINGER;  Surgeon: Ernest Mallick, MD;  Location: [facility] OR;  Service: Orthopedics;  Laterality: Right;   INCISION AND DRAINAGE OF WOUND Right 06/17/2020   Procedure: Right small finger irrigation and debridement, application of integra;  Surgeon: Ernest Mallick, MD;  Location: [facility];  Service: Orthopedics;  Laterality: Right;  5TH   TOOTH EXTRACTION N/A 06/05/2022   Procedure: EXTRACTION TEETH NUMBER THREE, FOUR, FIVE, SIX, SEVEN, EIGHT, NINE, TEN, ELEVEN, FIFTEEN, SIXTEEN, TWENTY, TWENTY-ONE, TWENTY-TWO, TWENTY-THREE, TWENTY-SEVEN, THIRTY TWO, ALVEOLOPLASTY, AND REMOVAL OF BILATERAL LINGUAL TORI;  Surgeon: Ocie Doyne, DMD;  Location: [facility] OR;  Service: Oral Surgery;  Laterality: N/A;   TOTAL HIP ARTHROPLASTY Right 07/29/2022   Procedure: TOTAL HIP ARTHROPLASTY;  Surgeon: Joen Laura, MD;  Location: [facility] ORS;  Service: Orthopedics;   Laterality: Right;   Patient Active Problem List   Diagnosis Date Noted   Cellulitis of hand 05/29/2020   S/P cervical spinal fusion 04/08/2018    PCP: Fleet Contras, MD  REFERRING PROVIDER: Joen Laura, MD  THERAPY DIAG:  Pain in right hip - Plan: PT plan of care cert/re-cert  Muscle weakness - Plan: PT plan of care cert/re-cert  Other abnormalities of gait and mobility - Plan: PT plan of care cert/re-cert  REFERRING DIAG: R hip replacement  Rationale for Evaluation and Treatment:  Rehabilitation  SUBJECTIVE:  PERTINENT PAST HISTORY:  R Posterior hip replacement 5/22, S/P cervical fusion      PRECAUTIONS: Other: none official - posterior hip replacement  WEIGHT BEARING RESTRICTIONS No  FALLS:  Has patient fallen in last 6 months? No, Number of falls: 0  MOI/History of condition:  Onset date: 5/22  SUBJECTIVE STATEMENT  Jonathan Carney is a 62 y.o. male who presents to clinic with chief complaint of R hip pain and stiffness following posterior hip replacement on 5/22.  He initially had significant pain, but this is improving. His L hip is significantly painful and limiting.   Red flags:  denies malaise, erythema / streaking, and chills / night sweats  Pain:  Are you having pain? Yes Pain location: L posterior lateral hip pain NPRS scale:  0/10 to 5/10 Aggravating factors: leaning while driving, walking, standing Relieving factors: resting Pain description: sharp, burning, and aching Stage: Acute Stability: getting  better 24 hour pattern: NA   Occupation: NA  Assistive Device: SPC, FWW   Hand Dominance: NA  Patient Goals/Specific Activities: fishing, walking, drive car   OBJECTIVE:   DIAGNOSTIC FINDINGS:  NA   GENERAL OBSERVATION/GAIT:   Highly antalgic gait - SPC in R hand - reduced time in stance on L  SENSATION:  Light touch: Appears intact  MUSCLE LENGTH: Hamstrings: Right no restriction; Left no restriction ASLR: Right ASLR  significantly reduced compared to PSLR ; Left ASLR significantly reduced compared to PSLR  Thomas test: Right no restriction; Left no restriction   LE MMT:  MMT Right 08/04/2022 Left 08/04/2022  Hip flexion (L2, L3) 3+ limited range 4*  Knee extension (L3) 4 4  Knee flexion 4 4  Hip abduction 3+ limited range   Hip extension Limited range bridge d/t weakness  Hip external rotation    Hip internal rotation    Hip adduction    Ankle dorsiflexion (L4)    Ankle plantarflexion (S1)    Ankle inversion    Ankle eversion    Great Toe ext (L5)    Grossly     (Blank rows = not tested, score listed is out of 5 possible points.  N = WNL, D = diminished, C = clear for gross weakness with myotome testing, * = concordant pain with testing)  LE ROM:  ROM Right 08/04/2022 Left 08/04/2022  Hip flexion 90 90  Hip extension    Hip abduction    Hip adduction    Hip internal rotation WNL Limited *  Hip external rotation    Knee flexion    Knee extension    Ankle dorsiflexion    Ankle plantarflexion    Ankle inversion    Ankle eversion     (Blank rows = not tested, N = WNL, * = concordant pain with testing)  Functional Tests  Eval (08/04/2022)    10 m max gait speed: 11'', .91 m/s, AD: SPC    30'' STS: 14x  UE used? Y                                                     PATIENT SURVEYS:  FOTO 34 ->73   TODAY'S TREATMENT: Creating, reviewing, and completing below HEP   PATIENT EDUCATION:  POC, diagnosis, prognosis, HEP, and outcome measures.  Pt educated via explanation, demonstration, and handout (HEP).  Pt confirms understanding verbally.   HOME EXERCISE PROGRAM: Access Code: [email or web address] URL: [email or web address] Date: 08/04/2022 Prepared by: Alphonzo Severance  Exercises - Supine Hip Adduction Isometric with Ball  - 1 x daily - 7 x weekly - 2 sets - 10 reps - 10'' hold - Hooklying Isometric Clamshell  - 1 x daily - 7 x weekly - 3 sets - 10 reps -  Bridge  - 1 x daily - 7 x weekly - 3 sets - 10 reps - 5-10'' hold - Sit to Stand with Armchair  - 1 x daily - 7 x weekly - 3 sets - 10 reps  Treatment priorities   Eval (08/04/2022)        Hip ER ROM        Hip abd/flexion/ext strength        gait  ASSESSMENT:  CLINICAL IMPRESSION: Jonathan Carney is a 62 y.o. male who presents to clinic with signs and sxs consistent with R hip pain and stiffness following R posterior THA.  Overall, he is progressing as expected post-op   OBJECTIVE IMPAIRMENTS: Pain, hip strength, hip ROM, gait, balance  ACTIVITY LIMITATIONS: bending, squatting, walking, fishing  PERSONAL FACTORS: See medical history and pertinent history   REHAB POTENTIAL: Good  CLINICAL DECISION MAKING: Stable/uncomplicated  EVALUATION COMPLEXITY: Low   GOALS:   SHORT TERM GOALS: Target date: 09/01/2022   Jonathan Carney will be >75% HEP compliant to improve carryover between sessions and facilitate independent management of condition  Evaluation: ongoing Goal status: INITIAL   LONG TERM GOALS: Target date: 09/29/2022   Jonathan Carney will improve FOTO score to 73 as a proxy for functional improvement  Evaluation/Baseline: 72 Goal status: INITIAL   2.  Jonathan Carney will be able to return to fishing on level surfaces  Evaluation/Baseline: limited Goal status: INITIAL   3.  Jonathan Carney will improve the following MMTs to >/= 4/5 to show improvement in strength:     Evaluation/Baseline: see chart in note  LE MMT:  MMT Right 08/04/2022 Left 08/04/2022  Hip flexion (L2, L3) 3+ limited range 4*  Knee extension (L3) 4 4  Knee flexion 4 4  Hip abduction 3+ limited range   Hip extension Limited range bridge d/t weakness  Hip external rotation    Hip internal rotation    Hip adduction    Ankle dorsiflexion (L4)    Ankle plantarflexion (S1)    Ankle inversion    Ankle eversion    Great Toe ext (L5)    Grossly     (Blank rows = not tested, score listed is out  of 5 possible points.  N = WNL, D = diminished, C = clear for gross weakness with myotome testing, * = concordant pain with testing)  Goal status: INITIAL   4.  Jonathan Carney will improve 10 meter max gait speed to 1.1 m/s (.1 m/s MCID) to show functional improvement in ambulation   Evaluation/Baseline: .91 m/s Goal status: INITIAL   [place]:       PLAN: PT FREQUENCY: 1-2x/week  PT DURATION: 8 weeks  PLANNED INTERVENTIONS: Therapeutic exercises, Aquatic therapy, Therapeutic activity, Neuro Muscular re-education, Gait training, Patient/Family education, Joint mobilization, Dry Needling, Electrical stimulation, Spinal mobilization and/or manipulation, Moist heat, Taping, Vasopneumatic device, Ionotophoresis 4mg /ml Dexamethasone, and Manual therapy   Alphonzo Severance PT, DPT 08/04/2022, 1:32 PM

## 2022-08-11 ENCOUNTER — Ambulatory Visit: Payer: Medicaid Other | Attending: Orthopedic Surgery | Admitting: Physical Therapy

## 2022-08-11 ENCOUNTER — Encounter: Payer: Self-pay | Admitting: Physical Therapy

## 2022-08-11 DIAGNOSIS — M25551 Pain in right hip: Secondary | ICD-10-CM | POA: Insufficient documentation

## 2022-08-11 DIAGNOSIS — R2689 Other abnormalities of gait and mobility: Secondary | ICD-10-CM | POA: Insufficient documentation

## 2022-08-11 DIAGNOSIS — M6281 Muscle weakness (generalized): Secondary | ICD-10-CM | POA: Insufficient documentation

## 2022-08-11 NOTE — Therapy (Signed)
OUTPATIENT PHYSICAL THERAPY LOWER EXTREMITY EVALUATION  Patient Name: Jonathan Carney MRN: [id number] DOB:05-Oct-1960, 62 y.o., male Today's Date: 08/11/2022   PT End of Session - 08/11/22 1043     Visit Number 2    Number of Visits --   1-2x/week   Date for PT Re-Evaluation 09/29/22    Authorization Type [employer] MCD    PT Start Time 1045    PT Stop Time 1126    PT Time Calculation (min) 41 min             Past Medical History:  Diagnosis Date   Arthritis    Borderline high cholesterol    GERD (gastroesophageal reflux disease)    occais, no meds   Hypertension    Smoker    Past Surgical History:  Procedure Laterality Date   ANTERIOR CERVICAL DECOMP/DISCECTOMY FUSION N/A 04/08/2018   Procedure: Cervical Five-Six, Cervical Six-Seven Anterior cervical decompression/discectomy/fusion;  Surgeon: Tia Alert, MD;  Location: [facility] OR;  Service: Neurosurgery;  Laterality: N/A;  anterior   EYE SURGERY  to straighten it out   Right   I & D EXTREMITY Right 05/30/2020   Procedure: IRRIGATION AND DEBRIDEMENT OF RIGHT SMALL  FINGER;  Surgeon: Ernest Mallick, MD;  Location: [facility] OR;  Service: Orthopedics;  Laterality: Right;   INCISION AND DRAINAGE OF WOUND Right 06/17/2020   Procedure: Right small finger irrigation and debridement, application of integra;  Surgeon: Ernest Mallick, MD;  Location: [facility];  Service: Orthopedics;  Laterality: Right;  5TH   TOOTH EXTRACTION N/A 06/05/2022   Procedure: EXTRACTION TEETH NUMBER THREE, FOUR, FIVE, SIX, SEVEN, EIGHT, NINE, TEN, ELEVEN, FIFTEEN, SIXTEEN, TWENTY, TWENTY-ONE, TWENTY-TWO, TWENTY-THREE, TWENTY-SEVEN, THIRTY TWO, ALVEOLOPLASTY, AND REMOVAL OF BILATERAL LINGUAL TORI;  Surgeon: Ocie Doyne, DMD;  Location: [facility] OR;  Service: Oral Surgery;  Laterality: N/A;   TOTAL HIP ARTHROPLASTY Right 07/29/2022   Procedure: TOTAL HIP ARTHROPLASTY;  Surgeon: Joen Laura, MD;  Location: [facility] ORS;  Service: Orthopedics;   Laterality: Right;   Patient Active Problem List   Diagnosis Date Noted   Cellulitis of hand 05/29/2020   S/P cervical spinal fusion 04/08/2018    PCP: Fleet Contras, MD  REFERRING PROVIDER: Joen Laura, MD  THERAPY DIAG:  Pain in right hip  Muscle weakness  Other abnormalities of gait and mobility  REFERRING DIAG: R hip replacement  Rationale for Evaluation and Treatment:  Rehabilitation  SUBJECTIVE:  PERTINENT PAST HISTORY:  R Posterior hip replacement 5/22, S/P cervical fusion      PRECAUTIONS: Other: none official - posterior hip replacement  WEIGHT BEARING RESTRICTIONS No  FALLS:  Has patient fallen in last 6 months? No, Number of falls: 0  MOI/History of condition:  Onset date: 5/22  SUBJECTIVE STATEMENT  Pt reports his L hip continues to improve.  He has a sharp pain in his hip when he coughs.     Pain:  Are you having pain? Yes Pain location: L posterior lateral hip pain NPRS scale:  0/10 to 5/10 Aggravating factors: leaning while driving, walking, standing Relieving factors: resting Pain description: sharp, burning, and aching Stage: Acute Stability: getting better 24 hour pattern: NA    OBJECTIVE:   DIAGNOSTIC FINDINGS:  NA   GENERAL OBSERVATION/GAIT:   Highly antalgic gait - SPC in R hand - reduced time in stance on L  SENSATION:  Light touch: Appears intact  MUSCLE LENGTH: Hamstrings: Right no restriction; Left no restriction ASLR: Right ASLR significantly  reduced compared to PSLR ; Left ASLR significantly reduced compared to PSLR  Thomas test: Right no restriction; Left no restriction   LE MMT:  MMT Right  Left   Hip flexion (L2, L3) 3+ limited range 4*  Knee extension (L3) 4 4  Knee flexion 4 4  Hip abduction 3+ limited range   Hip extension Limited range bridge d/t weakness  Hip external rotation    Hip internal rotation    Hip adduction    Ankle dorsiflexion (L4)    Ankle plantarflexion (S1)    Ankle  inversion    Ankle eversion    Great Toe ext (L5)    Grossly     (Blank rows = not tested, score listed is out of 5 possible points.  N = WNL, D = diminished, C = clear for gross weakness with myotome testing, * = concordant pain with testing)  LE ROM:  ROM Right  Left   Hip flexion 90 90  Hip extension    Hip abduction    Hip adduction    Hip internal rotation WNL Limited *  Hip external rotation    Knee flexion    Knee extension    Ankle dorsiflexion    Ankle plantarflexion    Ankle inversion    Ankle eversion     (Blank rows = not tested, N = WNL, * = concordant pain with testing)  Functional Tests  Eval     10 m max gait speed: 11'', .91 m/s, AD: SPC    30'' STS: 14x  UE used? Y                                                     PATIENT SURVEYS:  FOTO 68 ->73   PATIENT EDUCATION:  POC, diagnosis, prognosis, HEP, and outcome measures.  Pt educated via explanation, demonstration, and handout (HEP).  Pt confirms understanding verbally.   HOME EXERCISE PROGRAM: Access Code: [email or web address] URL: [email or web address] Date: 08/04/2022 Prepared by: Alphonzo Severance  Exercises - Supine Hip Adduction Isometric with Ball  - 1 x daily - 7 x weekly - 2 sets - 10 reps - 10'' hold - Hooklying Isometric Clamshell  - 1 x daily - 7 x weekly - 3 sets - 10 reps - Bridge  - 1 x daily - 7 x weekly - 3 sets - 10 reps - 5-10'' hold - Sit to Stand with Armchair  - 1 x daily - 7 x weekly - 3 sets - 10 reps  Treatment priorities   Eval        Hip ER ROM        Hip abd/flexion/ext strength        gait                          [facility] Adult PT Treatment:                                                DATE: 08/11/22  Therapeutic Exercise: nu-step L5 49m while taking subjective and planning session with patient Modified thomas EOM with PT OP Bridge with ball - short arc - 3x10 Hip adduction  ball squeeze - 5'' - 2x10 SLR - 3x10 ea  Neuromuscular  re-ed: Semi tandem (75%) on foam Blue rocker board DF/PF  ASSESSMENT:  CLINICAL IMPRESSION: [name] tolerated session well with no adverse reaction.  Pt progressing as expected.  Worked on basic hip ROM followed by strengthening.  Introduced proprioceptive exercises to good effect.  Continue per POC.  OBJECTIVE IMPAIRMENTS: Pain, hip strength, hip ROM, gait, balance  ACTIVITY LIMITATIONS: bending, squatting, walking, fishing  PERSONAL FACTORS: See medical history and pertinent history   REHAB POTENTIAL: Good  CLINICAL DECISION MAKING: Stable/uncomplicated  EVALUATION COMPLEXITY: Low   GOALS:   SHORT TERM GOALS: Target date: 09/01/2022   [place] will be >75% HEP compliant to improve carryover between sessions and facilitate independent management of condition  Evaluation: ongoing Goal status: INITIAL   LONG TERM GOALS: Target date: 09/29/2022   Daveon will improve FOTO score to 73 as a proxy for functional improvement  Evaluation/Baseline: 72 Goal status: INITIAL   2.  Ines will be able to return to fishing on level surfaces  Evaluation/Baseline: limited Goal status: INITIAL   3.  Ollis will improve the following MMTs to >/= 4/5 to show improvement in strength:     Evaluation/Baseline: see chart in note  LE MMT:  MMT Right 08/04/2022 Left 08/04/2022  Hip flexion (L2, L3) 3+ limited range 4*  Knee extension (L3) 4 4  Knee flexion 4 4  Hip abduction 3+ limited range   Hip extension Limited range bridge d/t weakness  Hip external rotation    Hip internal rotation    Hip adduction    Ankle dorsiflexion (L4)    Ankle plantarflexion (S1)    Ankle inversion    Ankle eversion    Great Toe ext (L5)    Grossly     (Blank rows = not tested, score listed is out of 5 possible points.  N = WNL, D = diminished, C = clear for gross weakness with myotome testing, * = concordant pain with testing)  Goal status: INITIAL   4.  Jacobs will improve 10 meter max  gait speed to 1.1 m/s (.1 m/s MCID) to show functional improvement in ambulation   Evaluation/Baseline: .91 m/s Goal status: INITIAL   [place]orms:       PLAN: PT FREQUENCY: 1-2x/week  PT DURATION: 8 weeks  PLANNED INTERVENTIONS: Therapeutic exercises, Aquatic therapy, Therapeutic activity, Neuro Muscular re-education, Gait training, Patient/Family education, Joint mobilization, Dry Needling, Electrical stimulation, Spinal mobilization and/or manipulation, Moist heat, Taping, Vasopneumatic device, Ionotophoresis 4mg /ml Dexamethasone, and Manual therapy   Alphonzo Severance PT, DPT 08/11/2022, 11:36 AM

## 2022-08-13 ENCOUNTER — Ambulatory Visit: Payer: Medicaid Other | Admitting: Physical Therapy

## 2022-08-18 ENCOUNTER — Ambulatory Visit: Payer: Medicaid Other | Admitting: Physical Therapy

## 2022-08-18 ENCOUNTER — Encounter (HOSPITAL_COMMUNITY): Payer: Self-pay

## 2022-08-20 ENCOUNTER — Encounter: Payer: Medicaid Other | Admitting: Physical Therapy

## 2022-08-25 ENCOUNTER — Ambulatory Visit: Payer: Medicaid Other | Admitting: Physical Therapy

## 2022-08-25 ENCOUNTER — Telehealth: Payer: Self-pay | Admitting: Physical Therapy

## 2022-08-25 NOTE — Telephone Encounter (Signed)
Called and informed patient of missed visit and provided reminder of next appt and attendance policy via VM.  1 late cancel and 1 n/s; if additional n/s or late cancel will D/C.

## 2022-09-01 ENCOUNTER — Ambulatory Visit: Payer: Medicaid Other

## 2022-09-01 NOTE — Progress Notes (Signed)
Surgery orders requested via Epic inbox. °

## 2022-09-07 ENCOUNTER — Ambulatory Visit: Payer: Self-pay | Admitting: Emergency Medicine

## 2022-09-07 DIAGNOSIS — G8929 Other chronic pain: Secondary | ICD-10-CM

## 2022-09-07 NOTE — Patient Instructions (Signed)
SURGICAL WAITING ROOM VISITATION  Patients having surgery or a procedure may have no more than 2 support people in the waiting area - these visitors may rotate.    Children under the age of 96 must have an adult with them who is not the patient.  Due to an increase in RSV and influenza rates and associated hospitalizations, children ages 21 and under may not visit patients in [facility] hospitals.  If the patient needs to stay at the hospital during part of their recovery, the visitor guidelines for inpatient rooms apply. Pre-op nurse will coordinate an appropriate time for 1 support person to accompany patient in pre-op.  This support person may not rotate.    Please refer to the [facility] website for the visitor guidelines for Inpatients (after your surgery is over and you are in a regular room).    Your procedure is scheduled on: 09/21/22   Report to [facility] Main Entrance    Report to admitting at 5:15 AM   Call this number if you have problems the morning of surgery [phone]   Do not eat food :After Midnight.   After Midnight you may have the following liquids until 4:30 AM DAY OF SURGERY  Water Non-Citrus Juices (without pulp, NO RED-Apple, White grape, White cranberry) Black Coffee (NO MILK/CREAM OR CREAMERS, sugar ok)  Clear Tea (NO MILK/CREAM OR CREAMERS, sugar ok) regular and decaf                             Plain Jell-O (NO RED)                                           Fruit ices (not with fruit pulp, NO RED)                                     Popsicles (NO RED)                                                               Sports drinks like Gatorade (NO RED)                 The day of surgery:  Drink ONE (1) Pre-Surgery Clear Ensure or G2 at AM the morning of surgery. Drink in one sitting. Do not sip.  This drink was given to you during your hospital  pre-op appointment visit. Nothing else to drink after completing the  Pre-Surgery Clear Ensure  or G2.          If you have questions, please contact your surgeon's office.   FOLLOW BOWEL PREP AND ANY ADDITIONAL PRE OP INSTRUCTIONS YOU RECEIVED FROM YOUR SURGEON'S OFFICE!!!     Oral Hygiene is also important to reduce your risk of infection.                                    Remember - BRUSH YOUR TEETH THE MORNING OF SURGERY WITH YOUR REGULAR TOOTHPASTE  DENTURES WILL BE REMOVED PRIOR TO SURGERY PLEASE DO NOT APPLY "Poly grip" OR ADHESIVES!!!   Do NOT smoke after Midnight   Take these medicines the morning of surgery with A SIP OF WATER: Tylenol, Amlodipine, famotidine, Omeprazole, Oxybutynin   DO NOT TAKE ANY ORAL DIABETIC MEDICATIONS DAY OF YOUR SURGERY  Bring CPAP mask and tubing day of surgery.                              You may not have any metal on your body including, jewelry, and body piercing             Do not wear lotions, powders, cologne, or deodorant              Men may shave face and neck.   Do not bring valuables to the hospital. [place] IS NOT             RESPONSIBLE   FOR VALUABLES.   Contacts, glasses, dentures or bridgework may not be worn into surgery.  DO NOT BRING YOUR HOME MEDICATIONS TO THE HOSPITAL. PHARMACY WILL DISPENSE MEDICATIONS LISTED ON YOUR MEDICATION LIST TO YOU DURING YOUR ADMISSION IN THE HOSPITAL!    Patients discharged on the day of surgery will not be allowed to drive home.  Someone NEEDS to stay with you for the first 24 hours after anesthesia.   Special Instructions: Bring a copy of your healthcare power of attorney and living will documents the day of surgery if you haven't scanned them before.              Please read over the following fact sheets you were given: IF YOU HAVE QUESTIONS ABOUT YOUR PRE-OP INSTRUCTIONS PLEASE CALL [phone]- Jonathan Carney    If you received a COVID test during your pre-op visit  it is requested that you wear a mask when out in public, stay away from anyone that may not be feeling well and  notify your surgeon if you develop symptoms. If you test positive for Covid or have been in contact with anyone that has tested positive in the last 10 days please notify you surgeon.      Pre-operative 5 CHG Bath Instructions   You can play a key role in reducing the risk of infection after surgery. Your skin needs to be as free of germs as possible. You can reduce the number of germs on your skin by washing with CHG (chlorhexidine gluconate) soap before surgery. CHG is an antiseptic soap that kills germs and continues to kill germs even after washing.   DO NOT use if you have an allergy to chlorhexidine/CHG or antibacterial soaps. If your skin becomes reddened or irritated, stop using the CHG and notify one of our RNs at [phone].   Please shower with the CHG soap starting 4 days before surgery using the following schedule:     Please keep in mind the following:  DO NOT shave, including legs and underarms, starting the day of your first shower.   You may shave your face at any point before/day of surgery.  Place clean sheets on your bed the day you start using CHG soap. Use a clean washcloth (not used since being washed) for each shower. DO NOT sleep with pets once you start using the CHG.   CHG Shower Instructions:  If you choose to wash your hair and private area, wash first with your normal shampoo/soap.  After you use shampoo/soap, rinse your hair and body thoroughly to remove shampoo/soap residue.  Turn the water OFF and apply about 3 tablespoons (45 ml) of CHG soap to a CLEAN washcloth.  Apply CHG soap ONLY FROM YOUR NECK DOWN TO YOUR TOES (washing for 3-5 minutes)  DO NOT use CHG soap on face, private areas, open wounds, or sores.  Pay special attention to the area where your surgery is being performed.  If you are having back surgery, having someone wash your back for you may be helpful. Wait 2 minutes after CHG soap is applied, then you may rinse off the CHG soap.  Pat  dry with a clean towel  Put on clean clothes/pajamas   If you choose to wear lotion, please use ONLY the CHG-compatible lotions on the back of this paper.     Additional instructions for the day of surgery: DO NOT APPLY any lotions, deodorants, cologne, or perfumes.   Put on clean/comfortable clothes.  Brush your teeth.  Ask your nurse before applying any prescription medications to the skin.      CHG Compatible Lotions   Aveeno Moisturizing lotion  Cetaphil Moisturizing Cream  Cetaphil Moisturizing Lotion  Clairol Herbal Essence Moisturizing Lotion, Dry Skin  Clairol Herbal Essence Moisturizing Lotion, Extra Dry Skin  Clairol Herbal Essence Moisturizing Lotion, Normal Skin  Curel Age Defying Therapeutic Moisturizing Lotion with Alpha Hydroxy  Curel Extreme Care Body Lotion  Curel Soothing Hands Moisturizing Hand Lotion  Curel Therapeutic Moisturizing Cream, Fragrance-Free  Curel Therapeutic Moisturizing Lotion, Fragrance-Free  Curel Therapeutic Moisturizing Lotion, Original Formula  Eucerin Daily Replenishing Lotion  Eucerin Dry Skin Therapy Plus Alpha Hydroxy Crme  Eucerin Dry Skin Therapy Plus Alpha Hydroxy Lotion  Eucerin Original Crme  Eucerin Original Lotion  Eucerin Plus Crme Eucerin Plus Lotion  Eucerin TriLipid Replenishing Lotion  Keri Anti-Bacterial Hand Lotion  Keri Deep Conditioning Original Lotion Dry Skin Formula Softly Scented  Keri Deep Conditioning Original Lotion, Fragrance Free Sensitive Skin Formula  Keri Lotion Fast Absorbing Fragrance Free Sensitive Skin Formula  Keri Lotion Fast Absorbing Softly Scented Dry Skin Formula  Keri Original Lotion  Keri Skin Renewal Lotion Keri Silky Smooth Lotion  Keri Silky Smooth Sensitive Skin Lotion  Nivea Body Creamy Conditioning Oil  Nivea Body Extra Enriched Teacher, adult education Moisturizing Lotion Nivea Crme  Nivea Skin Firming Lotion  NutraDerm 30 Skin Lotion  NutraDerm  Skin Lotion  NutraDerm Therapeutic Skin Cream  NutraDerm Therapeutic Skin Lotion  ProShield Protective Hand Cream  Provon moisturizing lotion  WHAT IS A BLOOD TRANSFUSION? Blood Transfusion Information  A transfusion is the replacement of blood or some of its parts. Blood is made up of multiple cells which provide different functions. Red blood cells carry oxygen and are used for blood loss replacement. White blood cells fight against infection. Platelets control bleeding. Plasma helps clot blood. Other blood products are available for specialized needs, such as hemophilia or other clotting disorders. BEFORE THE TRANSFUSION  Who gives blood for transfusions?  Healthy volunteers who are fully evaluated to make sure their blood is safe. This is blood bank blood. Transfusion therapy is the safest it has ever been in the practice of medicine. Before blood is taken from a donor, a complete history is taken to make sure that person has no history of diseases nor engages in risky social behavior (examples are intravenous drug use or sexual activity with multiple partners). The donor's travel history is  screened to minimize risk of transmitting infections, such as malaria. The donated blood is tested for signs of infectious diseases, such as HIV and hepatitis. The blood is then tested to be sure it is compatible with you in order to minimize the chance of a transfusion reaction. If you or a relative donates blood, this is often done in anticipation of surgery and is not appropriate for emergency situations. It takes many days to process the donated blood. RISKS AND COMPLICATIONS Although transfusion therapy is very safe and saves many lives, the main dangers of transfusion include:  Getting an infectious disease. Developing a transfusion reaction. This is an allergic reaction to something in the blood you were given. Every precaution is taken to prevent this. The decision to have a blood transfusion has  been considered carefully by your caregiver before blood is given. Blood is not given unless the benefits outweigh the risks. AFTER THE TRANSFUSION Right after receiving a blood transfusion, you will usually feel much better and more energetic. This is especially true if your red blood cells have gotten low (anemic). The transfusion raises the level of the red blood cells which carry oxygen, and this usually causes an energy increase. The nurse administering the transfusion will monitor you carefully for complications. HOME CARE INSTRUCTIONS  No special instructions are needed after a transfusion. You may find your energy is better. Speak with your caregiver about any limitations on activity for underlying diseases you may have. SEEK MEDICAL CARE IF:  Your condition is not improving after your transfusion. You develop redness or irritation at the intravenous (IV) site. SEEK IMMEDIATE MEDICAL CARE IF:  Any of the following symptoms occur over the next 12 hours: Shaking chills. You have a temperature by mouth above 102 F (38.9 C), not controlled by medicine. Chest, back, or muscle pain. People around you feel you are not acting correctly or are confused. Shortness of breath or difficulty breathing. Dizziness and fainting. You get a rash or develop hives. You have a decrease in urine output. Your urine turns a dark color or changes to pink, red, or brown. Any of the following symptoms occur over the next 10 days: You have a temperature by mouth above 102 F (38.9 C), not controlled by medicine. Shortness of breath. Weakness after normal activity. The white part of the eye turns yellow (jaundice). You have a decrease in the amount of urine or are urinating less often. Your urine turns a dark color or changes to pink, red, or brown. Document Released: 02/21/2000 Document Revised: 05/18/2011 Document Reviewed: 10/10/2007 [facility] Patient Information 2014 [place],  [place].  _______________________________________________________________________

## 2022-09-07 NOTE — H&P (View-Only) (Signed)
TOTAL HIP ADMISSION H&P  Patient is admitted for left total hip arthroplasty.  Subjective:  Chief Complaint: left hip pain  HPI: Jonathan Carney, 62 y.o. male, has a history of pain and functional disability in the left hip(s) due to arthritis and patient has failed non-surgical conservative treatments for greater than 12 weeks to include NSAID's and/or analgesics and activity modification.  Onset of symptoms was gradual starting  >1  years ago with gradually worsening course since that time.The patient noted no past surgery on the left hip(s).  Patient currently rates pain in the left hip at 10 out of 10 with activity. Patient has night pain, worsening of pain with activity and weight bearing, pain that interfers with activities of daily living, and pain with passive range of motion. Patient has evidence of periarticular osteophytes and joint space narrowing by imaging studies. This condition presents safety issues increasing the risk of falls.  There is no current active infection.  Patient Active Problem List   Diagnosis Date Noted   Cellulitis of hand 05/29/2020   S/P cervical spinal fusion 04/08/2018   Past Medical History:  Diagnosis Date   Arthritis    Borderline high cholesterol    GERD (gastroesophageal reflux disease)    occais, no meds   Hypertension    Smoker     Past Surgical History:  Procedure Laterality Date   ANTERIOR CERVICAL DECOMP/DISCECTOMY FUSION N/A 04/08/2018   Procedure: Cervical Five-Six, Cervical Six-Seven Anterior cervical decompression/discectomy/fusion;  Surgeon: Tia Alert, MD;  Location: [facility] OR;  Service: Neurosurgery;  Laterality: N/A;  anterior   EYE SURGERY  to straighten it out   Right   I & D EXTREMITY Right 05/30/2020   Procedure: IRRIGATION AND DEBRIDEMENT OF RIGHT SMALL  FINGER;  Surgeon: Ernest Mallick, MD;  Location: [facility] OR;  Service: Orthopedics;  Laterality: Right;   INCISION AND DRAINAGE OF WOUND Right 06/17/2020   Procedure: Right  small finger irrigation and debridement, application of integra;  Surgeon: Ernest Mallick, MD;  Location: [facility];  Service: Orthopedics;  Laterality: Right;  5TH   TOOTH EXTRACTION N/A 06/05/2022   Procedure: EXTRACTION TEETH NUMBER THREE, FOUR, FIVE, SIX, SEVEN, EIGHT, NINE, TEN, ELEVEN, FIFTEEN, SIXTEEN, TWENTY, TWENTY-ONE, TWENTY-TWO, TWENTY-THREE, TWENTY-SEVEN, THIRTY TWO, ALVEOLOPLASTY, AND REMOVAL OF BILATERAL LINGUAL TORI;  Surgeon: Ocie Doyne, DMD;  Location: [facility] OR;  Service: Oral Surgery;  Laterality: N/A;   TOTAL HIP ARTHROPLASTY Right 07/29/2022   Procedure: TOTAL HIP ARTHROPLASTY;  Surgeon: Joen Laura, MD;  Location: [facility] ORS;  Service: Orthopedics;  Laterality: Right;    Current Outpatient Medications  Medication Sig Dispense Refill Last Dose   acetaminophen (TYLENOL) 500 MG tablet Take 1,000 mg by mouth every 6 (six) hours as needed for moderate pain or headache.      amLODipine (NORVASC) 5 MG tablet Take 5 mg by mouth daily.      atorvastatin (LIPITOR) 40 MG tablet Take 40 mg by mouth at bedtime.      famotidine (PEPCID) 20 MG tablet Take 20 mg by mouth every morning.      meloxicam (MOBIC) 7.5 MG tablet Take 1 tablet (7.5 mg total) by mouth daily. (Patient taking differently: Take 7.5 mg by mouth daily as needed for pain.) 30 tablet 1    omeprazole (PRILOSEC) 40 MG capsule Take 40 mg by mouth daily as needed (Heartburn).      oxybutynin (DITROPAN) 5 MG tablet Take 5 mg by mouth 2 (two) times  daily as needed for bladder spasms.      No current facility-administered medications for this visit.   No Known Allergies  Social History   Tobacco Use   Smoking status: Every Day    Packs/day: .5    Types: Cigarettes   Smokeless tobacco: Never  Substance Use Topics   Alcohol use: No    Family History  Problem Relation Age of Onset   Healthy Mother    Cancer Father      Review of Systems  Musculoskeletal:  Positive for arthralgias.  All  other systems reviewed and are negative.   Objective:  Physical Exam Constitutional:      General: He is not in acute distress.    Appearance: Normal appearance. He is normal weight.  HENT:     Head: Normocephalic and atraumatic.  Eyes:     Extraocular Movements: Extraocular movements intact.     Conjunctiva/sclera: Conjunctivae normal.     Pupils: Pupils are equal, round, and reactive to light.  Cardiovascular:     Rate and Rhythm: Normal rate and regular rhythm.     Pulses: Normal pulses.     Heart sounds: Normal heart sounds.  Pulmonary:     Effort: Pulmonary effort is normal. No respiratory distress.     Breath sounds: Normal breath sounds.  Abdominal:     General: Bowel sounds are normal. There is no distension.     Palpations: Abdomen is soft.     Tenderness: There is no abdominal tenderness.  Musculoskeletal:        General: Tenderness present.     Cervical back: Normal range of motion and neck supple.     Comments: LEFT hip: TTP over groin, lateral aspect, greater trochanter.  Mild IT band tenderness.  No significant swelling.  No overlying lesions of area of chief complaint.  Decreased strength and ROM due to elicited pain.  Dorsiflexion and plantarflexion intact.  BLE appear grossly neurovascularly intact.  Gait mildly antalgic.   Lymphadenopathy:     Cervical: No cervical adenopathy.  Skin:    General: Skin is warm and dry.     Capillary Refill: Capillary refill takes less than 2 seconds.     Findings: No erythema or rash.  Neurological:     General: No focal deficit present.     Mental Status: He is alert and oriented to person, place, and time.  Psychiatric:        Mood and Affect: Mood normal.        Behavior: Behavior normal.     Vital signs in last 24 hours: @VSRANGES @  Labs:   Estimated body mass index is 35.67 kg/m as calculated from the following:   Height as of 07/29/22: 6' (1.829 m).   Weight as of 07/29/22: 119.3 kg.   Imaging  Review Plain radiographs demonstrate severe degenerative joint disease of the left hip(s). The bone quality appears to be good for age and reported activity level.      Assessment/Plan:  End stage arthritis, left hip(s)  The patient history, physical examination, clinical judgement of the provider and imaging studies are consistent with end stage degenerative joint disease of the left hip(s) and total hip arthroplasty is deemed medically necessary. The treatment options including medical management, injection therapy, arthroscopy and arthroplasty were discussed at length. The risks and benefits of total hip arthroplasty were presented and reviewed. The risks due to aseptic loosening, infection, stiffness, dislocation/subluxation,  thromboembolic complications and other imponderables were discussed.  The  patient acknowledged the explanation, agreed to proceed with the plan and consent was signed. Patient is being admitted for inpatient treatment for surgery, pain control, PT, OT, prophylactic antibiotics, VTE prophylaxis, progressive ambulation and ADL's and discharge planning.The patient is planning to be discharged home with outpatient PT    Patient's anticipated LOS is less than 2 midnights, meeting these requirements: - Younger than 55 - Lives within 1 hour of care - Has a competent adult at home to recover with post-op recover - NO history of  - Chronic pain requiring opiods  - Diabetes  - Coronary Artery Disease  - Heart failure  - Heart attack  - Stroke  - DVT/VTE  - Cardiac arrhythmia  - Respiratory Failure/COPD  - Renal failure  - Anemia  - Advanced Liver disease

## 2022-09-07 NOTE — Progress Notes (Signed)
Please place orders for PAT appointment scheduled 09/08/22.

## 2022-09-07 NOTE — H&P (Signed)
TOTAL HIP ADMISSION H&P  Patient is admitted for left total hip arthroplasty.  Subjective:  Chief Complaint: left hip pain  HPI: Jonathan Carney, 62 y.o. male, has a history of pain and functional disability in the left hip(s) due to arthritis and patient has failed non-surgical conservative treatments for greater than 12 weeks to include NSAID's and/or analgesics and activity modification.  Onset of symptoms was gradual starting  >1  years ago with gradually worsening course since that time.The patient noted no past surgery on the left hip(s).  Patient currently rates pain in the left hip at 10 out of 10 with activity. Patient has night pain, worsening of pain with activity and weight bearing, pain that interfers with activities of daily living, and pain with passive range of motion. Patient has evidence of periarticular osteophytes and joint space narrowing by imaging studies. This condition presents safety issues increasing the risk of falls.  There is no current active infection.  Patient Active Problem List   Diagnosis Date Noted   Cellulitis of hand 05/29/2020   S/P cervical spinal fusion 04/08/2018   Past Medical History:  Diagnosis Date   Arthritis    Borderline high cholesterol    GERD (gastroesophageal reflux disease)    occais, no meds   Hypertension    Smoker     Past Surgical History:  Procedure Laterality Date   ANTERIOR CERVICAL DECOMP/DISCECTOMY FUSION N/A 04/08/2018   Procedure: Cervical Five-Six, Cervical Six-Seven Anterior cervical decompression/discectomy/fusion;  Surgeon: Tia Alert, MD;  Location: [facility] OR;  Service: Neurosurgery;  Laterality: N/A;  anterior   EYE SURGERY  to straighten it out   Right   I & D EXTREMITY Right 05/30/2020   Procedure: IRRIGATION AND DEBRIDEMENT OF RIGHT SMALL  FINGER;  Surgeon: Ernest Mallick, MD;  Location: [facility] OR;  Service: Orthopedics;  Laterality: Right;   INCISION AND DRAINAGE OF WOUND Right 06/17/2020   Procedure: Right  small finger irrigation and debridement, application of integra;  Surgeon: Ernest Mallick, MD;  Location: [facility];  Service: Orthopedics;  Laterality: Right;  5TH   TOOTH EXTRACTION N/A 06/05/2022   Procedure: EXTRACTION TEETH NUMBER THREE, FOUR, FIVE, SIX, SEVEN, EIGHT, NINE, TEN, ELEVEN, FIFTEEN, SIXTEEN, TWENTY, TWENTY-ONE, TWENTY-TWO, TWENTY-THREE, TWENTY-SEVEN, THIRTY TWO, ALVEOLOPLASTY, AND REMOVAL OF BILATERAL LINGUAL TORI;  Surgeon: Ocie Doyne, DMD;  Location: [facility] OR;  Service: Oral Surgery;  Laterality: N/A;   TOTAL HIP ARTHROPLASTY Right 07/29/2022   Procedure: TOTAL HIP ARTHROPLASTY;  Surgeon: Joen Laura, MD;  Location: [facility];  Service: Orthopedics;  Laterality: Right;    Current Outpatient Medications  Medication Sig Dispense Refill Last Dose   acetaminophen (TYLENOL) 500 MG tablet Take 1,000 mg by mouth every 6 (six) hours as needed for moderate pain or headache.      amLODipine (NORVASC) 5 MG tablet Take 5 mg by mouth daily.      atorvastatin (LIPITOR) 40 MG tablet Take 40 mg by mouth at bedtime.      famotidine (PEPCID) 20 MG tablet Take 20 mg by mouth every morning.      meloxicam (MOBIC) 7.5 MG tablet Take 1 tablet (7.5 mg total) by mouth daily. (Patient taking differently: Take 7.5 mg by mouth daily as needed for pain.) 30 tablet 1    omeprazole (PRILOSEC) 40 MG capsule Take 40 mg by mouth daily as needed (Heartburn).      oxybutynin (DITROPAN) 5 MG tablet Take 5 mg by mouth 2 (two) times  daily as needed for bladder spasms.      No current facility-administered medications for this visit.   No Known Allergies  Social History   Tobacco Use   Smoking status: Every Day    Packs/day: .5    Types: Cigarettes   Smokeless tobacco: Never  Substance Use Topics   Alcohol use: No    Family History  Problem Relation Age of Onset   Healthy Mother    Cancer Father      Review of Systems  Musculoskeletal:  Positive for arthralgias.  All  other systems reviewed and are negative.   Objective:  Physical Exam Constitutional:      General: He is not in acute distress.    Appearance: Normal appearance. He is normal weight.  HENT:     Head: Normocephalic and atraumatic.  Eyes:     Extraocular Movements: Extraocular movements intact.     Conjunctiva/sclera: Conjunctivae normal.     Pupils: Pupils are equal, round, and reactive to light.  Cardiovascular:     Rate and Rhythm: Normal rate and regular rhythm.     Pulses: Normal pulses.     Heart sounds: Normal heart sounds.  Pulmonary:     Effort: Pulmonary effort is normal. No respiratory distress.     Breath sounds: Normal breath sounds.  Abdominal:     General: Bowel sounds are normal. There is no distension.     Palpations: Abdomen is soft.     Tenderness: There is no abdominal tenderness.  Musculoskeletal:        General: Tenderness present.     Cervical back: Normal range of motion and neck supple.     Comments: LEFT hip: TTP over groin, lateral aspect, greater trochanter.  Mild IT band tenderness.  No significant swelling.  No overlying lesions of area of chief complaint.  Decreased strength and ROM due to elicited pain.  Dorsiflexion and plantarflexion intact.  BLE appear grossly neurovascularly intact.  Gait mildly antalgic.   Lymphadenopathy:     Cervical: No cervical adenopathy.  Skin:    General: Skin is warm and dry.     Capillary Refill: Capillary refill takes less than 2 seconds.     Findings: No erythema or rash.  Neurological:     General: No focal deficit present.     Mental Status: He is alert and oriented to person, place, and time.  Psychiatric:        Mood and Affect: Mood normal.        Behavior: Behavior normal.     Vital signs in last 24 hours: @VSRANGES @  Labs:   Estimated body mass index is 35.67 kg/m as calculated from the following:   Height as of 07/29/22: 6' (1.829 m).   Weight as of 07/29/22: 119.3 kg.   Imaging  Review Plain radiographs demonstrate severe degenerative joint disease of the left hip(s). The bone quality appears to be good for age and reported activity level.      Assessment/Plan:  End stage arthritis, left hip(s)  The patient history, physical examination, clinical judgement of the provider and imaging studies are consistent with end stage degenerative joint disease of the left hip(s) and total hip arthroplasty is deemed medically necessary. The treatment options including medical management, injection therapy, arthroscopy and arthroplasty were discussed at length. The risks and benefits of total hip arthroplasty were presented and reviewed. The risks due to aseptic loosening, infection, stiffness, dislocation/subluxation,  thromboembolic complications and other imponderables were discussed.  The  patient acknowledged the explanation, agreed to proceed with the plan and consent was signed. Patient is being admitted for inpatient treatment for surgery, pain control, PT, OT, prophylactic antibiotics, VTE prophylaxis, progressive ambulation and ADL's and discharge planning.The patient is planning to be discharged home with outpatient PT    Patient's anticipated LOS is less than 2 midnights, meeting these requirements: - Younger than 55 - Lives within 1 hour of care - Has a competent adult at home to recover with post-op recover - NO history of  - Chronic pain requiring opiods  - Diabetes  - Coronary Artery Disease  - Heart failure  - Heart attack  - Stroke  - DVT/VTE  - Cardiac arrhythmia  - Respiratory Failure/COPD  - Renal failure  - Anemia  - Advanced Liver disease

## 2022-09-07 NOTE — Progress Notes (Addendum)
COVID Vaccine Completed:  Date of COVID positive in last 90 days:  PCP - Fleet Contras, MD Cardiologist -   Chest x-ray -  EKG - 06/05/22 Epic Stress Test -  ECHO -  Cardiac Cath -  Pacemaker/ICD device last checked: Spinal Cord Stimulator:  Bowel Prep -   Sleep Study -  CPAP -   Fasting Blood Sugar -  Checks Blood Sugar _____ times a day  Last dose of GLP1 agonist-  N/A GLP1 instructions:  N/A   Last dose of SGLT-2 inhibitors-  N/A SGLT-2 instructions: N/A   Blood Thinner Instructions:  Time Aspirin Instructions: Last Dose:  Activity level:  Can go up a flight of stairs and perform activities of daily living without stopping and without symptoms of chest pain or shortness of breath.  Able to exercise without symptoms  Unable to go up a flight of stairs without symptoms of     Anesthesia review:   Patient denies shortness of breath, fever, cough and chest pain at PAT appointment  Patient verbalized understanding of instructions that were given to them at the PAT appointment. Patient was also instructed that they will need to review over the PAT instructions again at home before surgery.

## 2022-09-08 ENCOUNTER — Other Ambulatory Visit: Payer: Self-pay

## 2022-09-08 ENCOUNTER — Encounter (HOSPITAL_COMMUNITY)
Admission: RE | Admit: 2022-09-08 | Discharge: 2022-09-08 | Disposition: A | Discharge status: Home / Self Care | Payer: Medicaid Other | Source: Ambulatory Visit | Attending: Orthopedic Surgery | Admitting: Orthopedic Surgery

## 2022-09-08 ENCOUNTER — Encounter (HOSPITAL_COMMUNITY): Payer: Self-pay

## 2022-09-08 ENCOUNTER — Ambulatory Visit: Payer: Medicaid Other

## 2022-09-08 VITALS — BP 146/99 | HR 66 | Temp 98.2°F | Resp 16 | Ht 72.0 in | Wt 243.0 lb

## 2022-09-08 DIAGNOSIS — M25552 Pain in left hip: Secondary | ICD-10-CM | POA: Diagnosis not present

## 2022-09-08 DIAGNOSIS — I10 Essential (primary) hypertension: Secondary | ICD-10-CM

## 2022-09-08 DIAGNOSIS — Z01818 Encounter for other preprocedural examination: Secondary | ICD-10-CM

## 2022-09-08 DIAGNOSIS — G8929 Other chronic pain: Secondary | ICD-10-CM | POA: Insufficient documentation

## 2022-09-08 DIAGNOSIS — Z01812 Encounter for preprocedural laboratory examination: Secondary | ICD-10-CM | POA: Diagnosis present

## 2022-09-08 HISTORY — DX: Depression, unspecified: F32.A

## 2022-09-08 LAB — COMPREHENSIVE METABOLIC PANEL
ALT: 32 U/L (ref 0–44)
AST: 33 U/L (ref 15–41)
Albumin: 3.8 g/dL (ref 3.5–5.0)
Alkaline Phosphatase: 71 U/L (ref 38–126)
Anion gap: 10 (ref 5–15)
BUN: 8 mg/dL (ref 6–20)
CO2: 25 mmol/L (ref 22–32)
Calcium: 9.2 mg/dL (ref 8.9–10.3)
Chloride: 105 mmol/L (ref 98–111)
Creatinine, Ser: 0.95 mg/dL (ref 0.61–1.24)
GFR, Estimated: >60 mL/min (ref >60)
Glucose, Bld: 97 mg/dL (ref 70–99)
Potassium: 3.3 mmol/L — ABNORMAL LOW (ref 3.5–5.1)
Sodium: 140 mmol/L (ref 135–145)
Total Bilirubin: 0.8 mg/dL (ref 0.3–1.2)
Total Protein: 7.2 g/dL (ref 6.5–8.1)

## 2022-09-08 LAB — CBC WITH DIFFERENTIAL/PLATELET
Abs Immature Granulocytes: 0.01 10*3/uL (ref 0.00–0.07)
Basophils Absolute: 0.1 10*3/uL (ref 0.0–0.1)
Basophils Relative: 1 %
Eosinophils Absolute: 0.1 10*3/uL (ref 0.0–0.5)
Eosinophils Relative: 3 %
HCT: 39.8 % (ref 39.0–52.0)
Hemoglobin: 13 g/dL (ref 13.0–17.0)
Immature Granulocytes: 0 %
Lymphocytes Relative: 29 %
Lymphs Abs: 1.5 10*3/uL (ref 0.7–4.0)
MCH: 30.5 pg (ref 26.0–34.0)
MCHC: 32.7 g/dL (ref 30.0–36.0)
MCV: 93.4 fL (ref 80.0–100.0)
Monocytes Absolute: 0.6 10*3/uL (ref 0.1–1.0)
Monocytes Relative: 12 %
Neutro Abs: 2.8 10*3/uL (ref 1.7–7.7)
Neutrophils Relative %: 55 %
Platelets: 308 10*3/uL (ref 150–400)
RBC: 4.26 MIL/uL (ref 4.22–5.81)
RDW: 14.2 % (ref 11.5–15.5)
WBC: 5 10*3/uL (ref 4.0–10.5)
nRBC: 0 % (ref 0.0–0.2)

## 2022-09-08 LAB — TYPE AND SCREEN
ABO/RH(D): O POS
Antibody Screen: NEGATIVE

## 2022-09-08 LAB — SURGICAL PCR SCREEN
MRSA, PCR: NEGATIVE
Staphylococcus aureus: POSITIVE — AB

## 2022-09-08 NOTE — Progress Notes (Signed)
[facility] results routed to Dr. Stevphen Rochester

## 2022-09-19 NOTE — Anesthesia Preprocedure Evaluation (Addendum)
Anesthesia Evaluation  Patient identified by MRN, date of birth, ID band Patient awake    Reviewed: Allergy & Precautions, NPO status , Patient's Chart, lab work & pertinent test results  Airway Mallampati: I  TM Distance: >3 FB Neck ROM: Full    Dental no notable dental hx. (+) Edentulous Upper, Edentulous Lower   Pulmonary Current SmokerPatient did not abstain from smoking.   Pulmonary exam normal breath sounds clear to auscultation       Cardiovascular hypertension, Normal cardiovascular exam Rhythm:Regular Rate:Normal     Neuro/Psych  PSYCHIATRIC DISORDERS  Depression       GI/Hepatic Neg liver ROS,GERD  ,,  Endo/Other    Renal/GU Lab Results      Component                Value               Date                     K                        3.3 (L)             09/08/2022                CREATININE               0.95                09/08/2022                GFRNONAA                 >60                 09/08/2022                CALCIUM                  9.2                 09/08/2022            Musculoskeletal  (+) Arthritis , Osteoarthritis,    Abdominal   Peds  Hematology Lab Results      Component                Value               Date                      WBC                      5.0                 09/08/2022                HGB                      13.0                09/08/2022                HCT                      39.8                09/08/2022  MCV                      93.4                09/08/2022                PLT                      308                 09/08/2022              Anesthesia Other Findings   Reproductive/Obstetrics                             Anesthesia Physical Anesthesia Plan  ASA: 2  Anesthesia Plan: Spinal   Post-op Pain Management: Minimal or no pain anticipated, Regional block* and Ofirmev IV (intra-op)*   Induction:   PONV Risk  Score and Plan: Treatment may vary due to age or medical condition, Propofol infusion and Ondansetron  Airway Management Planned: Natural Airway and Nasal Cannula  Additional Equipment: None  Intra-op Plan:   Post-operative Plan:   Informed Consent: I have reviewed the patients History and Physical, chart, labs and discussed the procedure including the risks, benefits and alternatives for the proposed anesthesia with the patient or authorized representative who has indicated his/her understanding and acceptance.     Dental advisory given  Plan Discussed with: CRNA and Anesthesiologist  Anesthesia Plan Comments:        Anesthesia Quick Evaluation

## 2022-09-21 ENCOUNTER — Ambulatory Visit (HOSPITAL_COMMUNITY)
Admission: RE | Admit: 2022-09-21 | Discharge: 2022-09-21 | Disposition: A | Discharge status: Home / Self Care | Payer: Medicaid Other | Source: Ambulatory Visit | Attending: Orthopedic Surgery | Admitting: Orthopedic Surgery

## 2022-09-21 ENCOUNTER — Encounter (HOSPITAL_COMMUNITY): Payer: Self-pay | Admitting: Orthopedic Surgery

## 2022-09-21 ENCOUNTER — Ambulatory Visit (HOSPITAL_COMMUNITY): Payer: Medicaid Other | Admitting: Anesthesiology

## 2022-09-21 ENCOUNTER — Ambulatory Visit (HOSPITAL_COMMUNITY): Payer: Medicaid Other

## 2022-09-21 ENCOUNTER — Other Ambulatory Visit: Payer: Self-pay

## 2022-09-21 ENCOUNTER — Encounter (HOSPITAL_COMMUNITY)
Admission: RE | Disposition: A | Discharge status: Home / Self Care | Payer: Self-pay | Source: Ambulatory Visit | Attending: Orthopedic Surgery

## 2022-09-21 DIAGNOSIS — K219 Gastro-esophageal reflux disease without esophagitis: Secondary | ICD-10-CM | POA: Insufficient documentation

## 2022-09-21 DIAGNOSIS — I10 Essential (primary) hypertension: Secondary | ICD-10-CM | POA: Insufficient documentation

## 2022-09-21 DIAGNOSIS — M1612 Unilateral primary osteoarthritis, left hip: Secondary | ICD-10-CM | POA: Insufficient documentation

## 2022-09-21 DIAGNOSIS — Z01818 Encounter for other preprocedural examination: Secondary | ICD-10-CM

## 2022-09-21 DIAGNOSIS — F17210 Nicotine dependence, cigarettes, uncomplicated: Secondary | ICD-10-CM | POA: Insufficient documentation

## 2022-09-21 HISTORY — PX: TOTAL HIP ARTHROPLASTY: SHX124

## 2022-09-21 LAB — BASIC METABOLIC PANEL
Anion gap: 4 — ABNORMAL LOW (ref 5–15)
BUN: 15 mg/dL (ref 6–20)
CO2: 26 mmol/L (ref 22–32)
Calcium: 8.5 mg/dL — ABNORMAL LOW (ref 8.9–10.3)
Chloride: 107 mmol/L (ref 98–111)
Creatinine, Ser: 1.09 mg/dL (ref 0.61–1.24)
GFR, Estimated: >60 mL/min (ref >60)
Glucose, Bld: 101 mg/dL — ABNORMAL HIGH (ref 70–99)
Potassium: 3.9 mmol/L (ref 3.5–5.1)
Sodium: 137 mmol/L (ref 135–145)

## 2022-09-21 LAB — CBC
HCT: 38.6 % — ABNORMAL LOW (ref 39.0–52.0)
Hemoglobin: 12.2 g/dL — ABNORMAL LOW (ref 13.0–17.0)
MCH: 30.3 pg (ref 26.0–34.0)
MCHC: 31.6 g/dL (ref 30.0–36.0)
MCV: 96 fL (ref 80.0–100.0)
Platelets: 310 10*3/uL (ref 150–400)
RBC: 4.02 MIL/uL — ABNORMAL LOW (ref 4.22–5.81)
RDW: 14.3 % (ref 11.5–15.5)
WBC: 6.4 10*3/uL (ref 4.0–10.5)
nRBC: 0 % (ref 0.0–0.2)

## 2022-09-21 SURGERY — ARTHROPLASTY, HIP, TOTAL,POSTERIOR APPROACH
Anesthesia: Spinal | Site: Hip | Laterality: Left

## 2022-09-21 MED ORDER — CEFAZOLIN SODIUM-DEXTROSE 2-4 GM/100ML-% IV SOLN
2.0000 g | INTRAVENOUS | Status: AC
  Administered 2022-09-21: 2 g via INTRAVENOUS
  Filled 2022-09-21: qty 100

## 2022-09-21 MED ORDER — PHENYLEPHRINE HCL (PRESSORS) 10 MG/ML IV SOLN
INTRAVENOUS | Status: DC | PRN
  Administered 2022-09-21: 100 ug via INTRAVENOUS

## 2022-09-21 MED ORDER — ISOPROPYL ALCOHOL 70 % SOLN
Status: DC | PRN
  Administered 2022-09-21: 1 via TOPICAL

## 2022-09-21 MED ORDER — LACTATED RINGERS IV BOLUS
500.0000 mL | Freq: Once | INTRAVENOUS | Status: AC
  Administered 2022-09-21: 500 mL via INTRAVENOUS

## 2022-09-21 MED ORDER — BUPIVACAINE LIPOSOME 1.3 % IJ SUSP
INTRAMUSCULAR | Status: AC
  Filled 2022-09-21: qty 20

## 2022-09-21 MED ORDER — PROPOFOL 1000 MG/100ML IV EMUL
INTRAVENOUS | Status: AC
  Filled 2022-09-21: qty 100

## 2022-09-21 MED ORDER — DEXAMETHASONE SODIUM PHOSPHATE 10 MG/ML IJ SOLN
INTRAMUSCULAR | Status: AC
  Filled 2022-09-21: qty 1

## 2022-09-21 MED ORDER — ACETAMINOPHEN 10 MG/ML IV SOLN
1000.0000 mg | Freq: Once | INTRAVENOUS | Status: DC | PRN
  Administered 2022-09-21: 1000 mg via INTRAVENOUS

## 2022-09-21 MED ORDER — OXYCODONE HCL 5 MG PO TABS: 5.0000 mg | ORAL_TABLET | ORAL | 0 refills | Status: AC | PRN

## 2022-09-21 MED ORDER — HYDROMORPHONE HCL 1 MG/ML IJ SOLN: 0.2500 mg | INTRAMUSCULAR | Status: DC | PRN

## 2022-09-21 MED ORDER — ACETAMINOPHEN 325 MG PO TABS: 325.0000 mg | ORAL_TABLET | Freq: Four times a day (QID) | ORAL | Status: DC | PRN

## 2022-09-21 MED ORDER — POLYETHYLENE GLYCOL 3350 17 G PO PACK: 17.0000 g | PACK | Freq: Every day | ORAL | 0 refills | Status: AC

## 2022-09-21 MED ORDER — SODIUM CHLORIDE (PF) 0.9 % IJ SOLN
INTRAMUSCULAR | Status: AC
  Filled 2022-09-21: qty 50

## 2022-09-21 MED ORDER — SODIUM CHLORIDE 0.9 % IR SOLN: Status: DC | PRN

## 2022-09-21 MED ORDER — PROPOFOL 10 MG/ML IV BOLUS
INTRAVENOUS | Status: DC | PRN
  Administered 2022-09-21: 80 ug/kg/min via INTRAVENOUS

## 2022-09-21 MED ORDER — BUPIVACAINE IN DEXTROSE 0.75-8.25 % IT SOLN
INTRATHECAL | Status: DC | PRN
  Administered 2022-09-21: 13.5 mg via INTRATHECAL

## 2022-09-21 MED ORDER — PROPOFOL 500 MG/50ML IV EMUL
INTRAVENOUS | Status: AC
  Filled 2022-09-21: qty 50

## 2022-09-21 MED ORDER — ORAL CARE MOUTH RINSE: 15.0000 mL | Freq: Once | OROMUCOSAL | Status: DC

## 2022-09-21 MED ORDER — ONDANSETRON HCL 4 MG/2ML IJ SOLN
INTRAMUSCULAR | Status: AC
  Filled 2022-09-21: qty 2

## 2022-09-21 MED ORDER — MELOXICAM 7.5 MG PO TABS: 15.0000 mg | ORAL_TABLET | Freq: Every day | ORAL | 0 refills | Status: AC

## 2022-09-21 MED ORDER — MIDAZOLAM HCL 5 MG/5ML IJ SOLN
INTRAMUSCULAR | Status: DC | PRN
  Administered 2022-09-21: 2 mg via INTRAVENOUS

## 2022-09-21 MED ORDER — DEXAMETHASONE SODIUM PHOSPHATE 10 MG/ML IJ SOLN
8.0000 mg | Freq: Once | INTRAMUSCULAR | Status: AC
  Administered 2022-09-21: 10 mg via INTRAVENOUS

## 2022-09-21 MED ORDER — ONDANSETRON HCL 4 MG PO TABS: 4.0000 mg | ORAL_TABLET | Freq: Three times a day (TID) | ORAL | 0 refills | Status: AC | PRN

## 2022-09-21 MED ORDER — ONDANSETRON HCL 4 MG/2ML IJ SOLN: 4.0000 mg | Freq: Once | INTRAMUSCULAR | Status: DC | PRN

## 2022-09-21 MED ORDER — POVIDONE-IODINE 10 % EX SWAB: 2.0000 | Freq: Once | CUTANEOUS | Status: DC

## 2022-09-21 MED ORDER — METHOCARBAMOL 500 MG IVPB - SIMPLE MED
INTRAVENOUS | Status: AC
  Filled 2022-09-21: qty 55

## 2022-09-21 MED ORDER — ACETAMINOPHEN 10 MG/ML IV SOLN
INTRAVENOUS | Status: AC
  Filled 2022-09-21: qty 100

## 2022-09-21 MED ORDER — ACETAMINOPHEN 500 MG PO TABS: 1000.0000 mg | ORAL_TABLET | Freq: Three times a day (TID) | ORAL | Status: AC | PRN

## 2022-09-21 MED ORDER — CEFAZOLIN SODIUM-DEXTROSE 2-4 GM/100ML-% IV SOLN: 2.0000 g | Freq: Four times a day (QID) | INTRAVENOUS | Status: DC

## 2022-09-21 MED ORDER — ONDANSETRON HCL 4 MG/2ML IJ SOLN: 4.0000 mg | Freq: Four times a day (QID) | INTRAMUSCULAR | Status: DC | PRN

## 2022-09-21 MED ORDER — OXYCODONE HCL 5 MG PO TABS: 5.0000 mg | ORAL_TABLET | Freq: Once | ORAL | Status: DC | PRN

## 2022-09-21 MED ORDER — WATER FOR IRRIGATION, STERILE IR SOLN
Status: DC | PRN
  Administered 2022-09-21: 2000 mL

## 2022-09-21 MED ORDER — BUPIVACAINE-EPINEPHRINE 0.25% -1:200000 IJ SOLN
INTRAMUSCULAR | Status: AC
  Filled 2022-09-21: qty 1

## 2022-09-21 MED ORDER — ONDANSETRON HCL 4 MG/2ML IJ SOLN
INTRAMUSCULAR | Status: DC | PRN
  Administered 2022-09-21: 4 mg via INTRAVENOUS

## 2022-09-21 MED ORDER — LIDOCAINE HCL (PF) 2 % IJ SOLN
INTRAMUSCULAR | Status: AC
  Filled 2022-09-21: qty 5

## 2022-09-21 MED ORDER — OXYCODONE HCL 5 MG PO TABS
10.0000 mg | ORAL_TABLET | ORAL | Status: DC | PRN
  Administered 2022-09-21: 10 mg via ORAL

## 2022-09-21 MED ORDER — PHENYLEPHRINE HCL-NACL 20-0.9 MG/250ML-% IV SOLN
INTRAVENOUS | Status: AC
  Filled 2022-09-21: qty 250

## 2022-09-21 MED ORDER — ACETAMINOPHEN 500 MG PO TABS
1000.0000 mg | ORAL_TABLET | Freq: Once | ORAL | Status: AC
  Administered 2022-09-21: 1000 mg via ORAL
  Filled 2022-09-21: qty 2

## 2022-09-21 MED ORDER — BUPIVACAINE LIPOSOME 1.3 % IJ SUSP: 10.0000 mL | Freq: Once | INTRAMUSCULAR | Status: DC

## 2022-09-21 MED ORDER — LIDOCAINE HCL (PF) 2 % IJ SOLN
INTRAMUSCULAR | Status: DC | PRN
  Administered 2022-09-21: 60 mg via INTRADERMAL

## 2022-09-21 MED ORDER — BUPIVACAINE-EPINEPHRINE 0.25% -1:200000 IJ SOLN
INTRAMUSCULAR | Status: DC | PRN
  Administered 2022-09-21: 30 mL

## 2022-09-21 MED ORDER — ONDANSETRON HCL 4 MG PO TABS: 4.0000 mg | ORAL_TABLET | Freq: Four times a day (QID) | ORAL | Status: DC | PRN

## 2022-09-21 MED ORDER — METHOCARBAMOL 500 MG PO TABS: 500.0000 mg | ORAL_TABLET | Freq: Three times a day (TID) | ORAL | 0 refills | Status: AC | PRN

## 2022-09-21 MED ORDER — PHENYLEPHRINE HCL-NACL 20-0.9 MG/250ML-% IV SOLN
INTRAVENOUS | Status: DC | PRN
  Administered 2022-09-21: 30 ug/min via INTRAVENOUS

## 2022-09-21 MED ORDER — ASPIRIN 81 MG PO TBEC: 81.0000 mg | DELAYED_RELEASE_TABLET | Freq: Two times a day (BID) | ORAL | Status: AC

## 2022-09-21 MED ORDER — TRANEXAMIC ACID-NACL 1000-0.7 MG/100ML-% IV SOLN
1000.0000 mg | INTRAVENOUS | Status: AC
  Administered 2022-09-21: 1000 mg via INTRAVENOUS
  Filled 2022-09-21: qty 100

## 2022-09-21 MED ORDER — METHOCARBAMOL 500 MG IVPB - SIMPLE MED
500.0000 mg | Freq: Four times a day (QID) | INTRAVENOUS | Status: DC | PRN
  Administered 2022-09-21: 500 mg via INTRAVENOUS
  Filled 2022-09-21: qty 55

## 2022-09-21 MED ORDER — SODIUM CHLORIDE 0.9 % IR SOLN
Status: DC | PRN
  Administered 2022-09-21: 3000 mL

## 2022-09-21 MED ORDER — CHLORHEXIDINE GLUCONATE 0.12 % MT SOLN: 15.0000 mL | Freq: Once | OROMUCOSAL | Status: DC

## 2022-09-21 MED ORDER — PROPOFOL 500 MG/50ML IV EMUL
INTRAVENOUS | Status: DC | PRN
  Administered 2022-09-21: 60 mg via INTRAVENOUS

## 2022-09-21 MED ORDER — METHOCARBAMOL 500 MG PO TABS: 500.0000 mg | ORAL_TABLET | Freq: Four times a day (QID) | ORAL | Status: DC | PRN

## 2022-09-21 MED ORDER — MIDAZOLAM HCL 2 MG/2ML IJ SOLN
INTRAMUSCULAR | Status: AC
  Filled 2022-09-21: qty 2

## 2022-09-21 MED ORDER — SODIUM CHLORIDE 0.9 % IV SOLN: INTRAVENOUS | Status: DC

## 2022-09-21 MED ORDER — LACTATED RINGERS IV BOLUS
250.0000 mL | Freq: Once | INTRAVENOUS | Status: AC
  Administered 2022-09-21: 250 mL via INTRAVENOUS

## 2022-09-21 MED ORDER — HYDROMORPHONE HCL 1 MG/ML IJ SOLN: 0.5000 mg | INTRAMUSCULAR | Status: DC | PRN

## 2022-09-21 MED ORDER — ACETAMINOPHEN 500 MG PO TABS: 1000.0000 mg | ORAL_TABLET | Freq: Four times a day (QID) | ORAL | Status: DC

## 2022-09-21 MED ORDER — LACTATED RINGERS IV SOLN: INTRAVENOUS | Status: DC

## 2022-09-21 MED ORDER — AMISULPRIDE (ANTIEMETIC) 5 MG/2ML IV SOLN: 10.0000 mg | Freq: Once | INTRAVENOUS | Status: DC | PRN

## 2022-09-21 MED ORDER — SODIUM CHLORIDE (PF) 0.9 % IJ SOLN
INTRAMUSCULAR | Status: DC | PRN
  Administered 2022-09-21: 30 mL

## 2022-09-21 MED ORDER — BUPIVACAINE LIPOSOME 1.3 % IJ SUSP
INTRAMUSCULAR | Status: DC | PRN
  Administered 2022-09-21: 20 mL

## 2022-09-21 MED ORDER — KETOROLAC TROMETHAMINE 15 MG/ML IJ SOLN: 15.0000 mg | Freq: Four times a day (QID) | INTRAMUSCULAR | Status: DC

## 2022-09-21 MED ORDER — OXYCODONE HCL 5 MG/5ML PO SOLN: 5.0000 mg | Freq: Once | ORAL | Status: DC | PRN

## 2022-09-21 MED ORDER — OXYCODONE HCL 5 MG PO TABS
ORAL_TABLET | ORAL | Status: AC
  Filled 2022-09-21: qty 2

## 2022-09-21 MED ORDER — 0.9 % SODIUM CHLORIDE (POUR BTL) OPTIME
TOPICAL | Status: DC | PRN
  Administered 2022-09-21: 1000 mL

## 2022-09-21 SURGICAL SUPPLY — 74 items
ADH SKN CLS APL DERMABOND .7 (GAUZE/BANDAGES/DRESSINGS) ×1
APL PRP STRL LF DISP 70% ISPRP (MISCELLANEOUS) ×2
BAG COUNTER SPONGE SURGICOUNT (BAG) IMPLANT
BAG SPEC THK2 15X12 ZIP CLS (MISCELLANEOUS) ×1
BAG SPNG CNTER NS LX DISP (BAG)
BAG ZIPLOCK 12X15 (MISCELLANEOUS) ×2 IMPLANT
BLADE SAW SAG 25X90X1.19 (BLADE) ×2 IMPLANT
CHLORAPREP W/TINT 26 (MISCELLANEOUS) ×4 IMPLANT
CNTNR URN SCR LID CUP LEK RST (MISCELLANEOUS) ×2 IMPLANT
CONT SPEC 4OZ STRL OR WHT (MISCELLANEOUS) ×1
COVER SURGICAL LIGHT HANDLE (MISCELLANEOUS) ×2 IMPLANT
DERMABOND ADVANCED .7 DNX12 (GAUZE/BANDAGES/DRESSINGS) ×2 IMPLANT
DRAPE HIP W/POCKET STRL (MISCELLANEOUS) ×2 IMPLANT
DRAPE INCISE IOBAN 66X45 STRL (DRAPES) ×2 IMPLANT
DRAPE INCISE IOBAN 85X60 (DRAPES) ×2 IMPLANT
DRAPE POUCH INSTRU U-SHP 10X18 (DRAPES) ×2 IMPLANT
DRAPE SHEET LG 3/4 BI-LAMINATE (DRAPES) ×6 IMPLANT
DRAPE U-SHAPE 47X51 STRL (DRAPES) ×4 IMPLANT
DRESSING AQUACEL AG SP 3.5X10 (GAUZE/BANDAGES/DRESSINGS) ×2 IMPLANT
DRSG AQUACEL AG ADV 3.5X10 (GAUZE/BANDAGES/DRESSINGS) IMPLANT
DRSG AQUACEL AG SP 3.5X10 (GAUZE/BANDAGES/DRESSINGS) ×1
ELECT BLADE TIP CTD 4 INCH (ELECTRODE) ×2 IMPLANT
ELECT REM PT RETURN 15FT ADLT (MISCELLANEOUS) ×2 IMPLANT
GAUZE SPONGE 4X4 12PLY STRL (GAUZE/BANDAGES/DRESSINGS) ×2 IMPLANT
GLOVE BIO SURGEON STRL SZ 6.5 (GLOVE) ×4 IMPLANT
GLOVE BIOGEL PI IND STRL 6.5 (GLOVE) ×2 IMPLANT
GLOVE BIOGEL PI IND STRL 8 (GLOVE) ×2 IMPLANT
GLOVE SURG ORTHO 8.0 STRL STRW (GLOVE) ×4 IMPLANT
GOWN STRL REUS W/ TWL XL LVL3 (GOWN DISPOSABLE) ×4 IMPLANT
GOWN STRL REUS W/TWL XL LVL3 (GOWN DISPOSABLE) ×2
HANDPIECE INTERPULSE COAX TIP (DISPOSABLE)
HEAD BIOLOX HIP 36/+2.5 (Joint) IMPLANT
HIP BIOLOX HD 36/+2.5 (Joint) ×1 IMPLANT
HOLDER FOLEY CATH W/STRAP (MISCELLANEOUS) ×2 IMPLANT
HOOD PEEL AWAY T7 (MISCELLANEOUS) ×6 IMPLANT
INSERT TRIDENT POLY 36 0DEG (Insert) IMPLANT
KIT BASIN OR (CUSTOM PROCEDURE TRAY) ×2 IMPLANT
KIT TURNOVER KIT A (KITS) IMPLANT
MANIFOLD NEPTUNE II (INSTRUMENTS) ×2 IMPLANT
MARKER SKIN DUAL TIP RULER LAB (MISCELLANEOUS) ×2 IMPLANT
NDL SAFETY ECLIP 18X1.5 (MISCELLANEOUS) ×4 IMPLANT
NS IRRIG 1000ML POUR BTL (IV SOLUTION) ×2 IMPLANT
PACK TOTAL JOINT (CUSTOM PROCEDURE TRAY) ×2 IMPLANT
PAD ARMBOARD 7.5X6 YLW CONV (MISCELLANEOUS) ×2 IMPLANT
PRESSURIZER FEMORAL UNIV (MISCELLANEOUS) IMPLANT
PROTECTOR NERVE ULNAR (MISCELLANEOUS) IMPLANT
RETRIEVER SUT HEWSON (MISCELLANEOUS) ×2 IMPLANT
SCREW HEX LP 6.5X20 (Screw) IMPLANT
SCREW HEX LP 6.5X30 (Screw) IMPLANT
SEALER BIPOLAR AQUA 6.0 (INSTRUMENTS) IMPLANT
SET HNDPC FAN SPRY TIP SCT (DISPOSABLE) IMPLANT
SHELL ACETABUL CLUSTER SZ 54 (Shell) IMPLANT
SHIELD FACE FULL FLUID (MISCELLANEOUS) ×2 IMPLANT
SOLUTION IRRIG SURGIPHOR (IV SOLUTION) IMPLANT
SPIKE FLUID TRANSFER (MISCELLANEOUS) ×2 IMPLANT
STEM HIP 127 DEG (Stem) IMPLANT
SUCTION TUBE FRAZIER 12FR DISP (SUCTIONS) ×2 IMPLANT
SUT BONE WAX W31G (SUTURE) ×2 IMPLANT
SUT ETHIBOND #5 BRAIDED 30INL (SUTURE) ×2 IMPLANT
SUT MNCRL AB 3-0 PS2 18 (SUTURE) ×2 IMPLANT
SUT STRATAFIX 0 PDS 27 VIOLET (SUTURE) ×1
SUT STRATAFIX PDO 1 14 VIOLET (SUTURE) ×1
SUT STRATFX PDO 1 14 VIOLET (SUTURE) ×1
SUT VIC AB 2-0 CT2 27 (SUTURE) ×4 IMPLANT
SUTURE STRATFX 0 PDS 27 VIOLET (SUTURE) ×2 IMPLANT
SUTURE STRATFX PDO 1 14 VIOLET (SUTURE) ×2 IMPLANT
SYR 30ML LL (SYRINGE) ×2 IMPLANT
SYR 50ML LL SCALE MARK (SYRINGE) ×2 IMPLANT
TOWEL OR 17X26 10 PK STRL BLUE (TOWEL DISPOSABLE) ×2 IMPLANT
TOWER CARTRIDGE SMART MIX (DISPOSABLE) IMPLANT
TRAY FOLEY MTR SLVR 16FR STAT (SET/KITS/TRAYS/PACK) IMPLANT
TUBE SUCTION HIGH CAP CLEAR NV (SUCTIONS) ×2 IMPLANT
UNDERPAD 30X36 HEAVY ABSORB (UNDERPADS AND DIAPERS) ×2 IMPLANT
WATER STERILE IRR 1000ML POUR (IV SOLUTION) ×4 IMPLANT

## 2022-09-21 NOTE — Interval H&P Note (Signed)
The patient has been re-examined, and the chart reviewed, and there have been no interval changes to the documented history and physical.    Plan for L THA for L hip OA  The operative side was examined and the patient was confirmed to have sensation to DPN, SPN, TN intact, Motor EHL, ext, flex 5/5, and DP 2+, PT 2+, No significant edema.   The risks, benefits, and alternatives have been discussed at length with patient, and the patient is willing to proceed.  Left hip marked. Consent has been signed.

## 2022-09-21 NOTE — Anesthesia Postprocedure Evaluation (Signed)
Anesthesia Post Note  Patient: Jonathan Carney  Procedure(s) Performed: TOTAL HIP ARTHROPLASTY (Left: Hip)     Patient location during evaluation: Nursing Unit Anesthesia Type: Spinal Level of consciousness: oriented and awake and alert Pain management: pain level controlled Vital Signs Assessment: post-procedure vital signs reviewed and stable Respiratory status: spontaneous breathing and respiratory function stable Cardiovascular status: blood pressure returned to baseline and stable Postop Assessment: no headache, no backache, no apparent nausea or vomiting and patient able to bend at knees Anesthetic complications: no   No notable events documented.  Last Vitals:  Vitals:   09/21/22 0614  BP: (!) 158/90  Pulse: 83  Resp: 18  Temp: 36.8 C  SpO2: 96%    Last Pain:  Vitals:   09/21/22 9147  TempSrc: Oral  PainSc:                  Trevor Iha

## 2022-09-21 NOTE — Op Note (Signed)
09/21/2022  9:18 AM  PATIENT:  Jonathan Carney   MRN: [id number]  PRE-OPERATIVE DIAGNOSIS: End-stage left hip osteoarthritis  POST-OPERATIVE DIAGNOSIS:  same  PROCEDURE:  Procedure(s): Left TOTAL HIP ARTHROPLASTY  PREOPERATIVE INDICATIONS:    Collan Carney is an 62 y.o. male who has a diagnosis of end-stage left hip osteoarthritis and elected for surgical management after failing conservative treatment.  The risks benefits and alternatives were discussed with the patient including but not limited to the risks of nonoperative treatment, versus surgical intervention including infection, bleeding, nerve injury, periprosthetic fracture, the need for revision surgery, dislocation, leg length discrepancy, blood clots, cardiopulmonary complications, morbidity, mortality, among others, and they were willing to proceed.     OPERATIVE REPORT     SURGEON:  Weber Cooks, MD    ASSISTANT: Kathie Dike, PA-C, (Present throughout the entire procedure,  necessary for completion of procedure in a timely manner, assisting with retraction, instrumentation, and closure)     ANESTHESIA: Spinal  ESTIMATED BLOOD LOSS: 250cc    COMPLICATIONS:  None.   COMPONENTS:   Stryker Trident 254 mm acetabular shell, 6.5 hex screws x 2, neutral X.3 polyethylene liner, Stryker Accolade 2 with 127 degree neck angle size #5, 36+2.5 mm ceramic head Implant Name Type Inv. Item Serial No. Manufacturer Lot No. LRB No. Used Action  SHELL ACETABUL CLUSTER SZ 54 - [id number] Shell SHELL ACETABUL CLUSTER SZ 54  [employer] [id number] A Left 1 Implanted  SCREW HEX LP 6.5X30 - [id number] Screw SCREW HEX LP 6.5X30  [employer] H6T Left 1 Implanted  SCREW HEX LP 6.5X20 - [id number] Screw SCREW HEX LP 6.5X20  [employer] HGKD Left 1 Implanted  INSERT TRIDENT POLY 36 0DEG - [id number] Insert INSERT TRIDENT POLY 36 0DEG  [employer] [id number] Left 1 Implanted  STEM HIP 127 DEG - [id number] Stem STEM  HIP 127 DEG  [employer] [id number] A Left 1 Implanted  HIP BIOLOX HD 36/+2.5 - [id number] Joint HIP BIOLOX HD 36/+2.5  [employer] [id number] Left 1 Implanted     PROCEDURE IN DETAIL:   The patient was met in the holding area and  identified.  The appropriate hip was identified and marked at the operative site.  The patient was then transported to the OR  and  placed under anesthesia.  At that point, the patient was  placed in the lateral decubitus position with the operative side up and  secured to the operating room table  and all bony prominences padded. A subaxillary role was also placed.    The operative lower extremity was prepped from the iliac crest to the distal leg.  Sterile draping was performed.  Preoperative antibiotics, 2 gm of ancef,1 gm of Tranexamic Acid, and 8 mg of Decadron administered. Time out was performed prior to incision.      A routine posterolateral approach was utilized via sharp dissection  carried down to the subcutaneous tissue.  Gross bleeders were Bovie coagulated.  The iliotibial band was identified and incised along the length of the skin incision through the glute max fascia.  Charnley retractor was placed with care to protect the sciatic nerve posteriorly.  With the hip internally rotated, the piriformis tendon was identified and released from the femoral insertion and tagged with a #5 Ethibond.  A capsulotomy was then performed off the femoral insertion and also tagged with a #5 Ethibond.    The femoral neck was exposed, and I resected the femoral neck based on preoperative templating relative to the lesser trochanter.  I then exposed the deep acetabulum, cleared out any tissue including the ligamentum teres.  After adequate visualization, I excised the labrum.  I then started reaming with a 52 mm reamer, first medializing to the floor of the cotyloid fossa, and then in the position of the cup aiming towards the greater sciatic notch, matching the  version of the transverse acetabular ligament and tucked under the anterior wall. I reamed up to 54 mm reamer with good bony bed preparation and a 54 mm cup was chosen.  The real cup was then impacted into place.  Appropriate version and inclination was confirmed clinically matching their bony anatomy, and also with the use of the jig.  I placed 2 screws in the posterior superior quadrant to augment fixation.  A neutral liner was placed and impacted. It was confirmed to be appropriately seated and the acetabular retractors were removed.    I then prepared the proximal femur using the box cutter, Charnley awl, and then sequentially broached starting with 0 up to a size 5.  A trial broach, neck, and head was utilized, and I reduced the hip and it was found to have excellent stability.  There was no impingement with full extension and 90 degrees external rotation.  The hip was stable at the position of sleep and with 90 degrees flexion and 80 degrees of internal rotation.  Leg lengths were also clinically assessed in the lateral position and felt to be equal. Intra-Op flatplate was obtained and confirmed appropriate component positions.  Good fill of the femur with the size 5 broach.  And restoration of leg length and offset. No evidence or concern for fracture.  A final femoral prosthesis size 5 was selected. I then impacted the real femoral prosthesis into place.I again trialed and selected a 36+ 2.22mm ball. The hip was then reduced and taken through a range of motion. There was no impingement with full extension and 90 degrees external rotation.  The hip was stable at the position of sleep and with 90 degrees flexion and 80degrees of internal rotation. Leg lengths were  again assessed and felt to be restored.  We then opened, and I impacted the real head ball into place.  The posterior capsule was then closed with #5 Ethibond.  The piriformis was repaired through the base of the abductor tendon using a  [place] suture passer.  I then irrigated the hip copiously with dilute Betadine and with normal saline pulse lavage. Periarticular injection was then performed with Exparel.   We repaired the fascia #1 barbed suture, followed by 0 barbed suture for the subcutaneous fat.  Skin was closed with 2-0 Vicryl and 3-0 Monocryl.  Dermabond and Aquacel dressing were applied. The patient was then awakened and returned to PACU in stable and satisfactory condition.  Leg lengths in the supine position were assessed and felt to be clinically equal. There were no complications.  Post op recs: WB: WBAT LLE, No formal hip precautions Abx: ancef Imaging: PACU pelvis Xray Dressing: Aquacell, keep intact until follow up DVT prophylaxis: Aspirin 81BID starting POD1 Follow up: 2 weeks after surgery for a wound check with Dr. Blanchie Dessert at [facility].  Address: [address], [place], [place] [address]  Office Phone: [phone]   Weber Cooks, MD Orthopedic Surgeon

## 2022-09-21 NOTE — Evaluation (Signed)
Physical Therapy Evaluation Patient Details Name: Jonathan Carney MRN: [id number] DOB: Mar 03, 1961 Today's Date: 09/21/2022  History of Present Illness  62 yo male presents to therapy s/p L THA, posterior lateral approach on 08/22/2022 due to failure of conservative measures. Pt is L LE WBAT and no formal hip precautions. Pt PMH includes but is not limited to: cervical spine fusion, cellulitis of hand,HTN, tobacco abuse and R THA, posterior lateral approach on 07/29/2022.   Clinical Impression     Jonathan Carney is a 62 y.o. male POD 0 s/p LTHA. Patient reports mod I with SPC for mobility at baseline. Patient is now limited by functional impairments (see PT problem list below) and requires min guard and cues for transfers and gait with RW. Patient was able to ambulate 50 feet  x 2 feet with RW and min guard and progressing to S and cues for safe walker management. Patient educated on safe sequencing for stair mobility, car transfers, pain management, use of CP/Ice, fall risk prevention, R LE placement and use of RW pt verbalized safe guarding position for people assisting with mobility. Patient instructed in exercises to facilitate ROM and circulation reviewed and HO provided. Patient will benefit from continued skilled PT interventions to address impairments and progress towards PLOF. Patient has met mobility goals at adequate level for discharge home with family and social support and OPPT services; will continue to follow if pt continues acute stay to progress towards Mod I goals.       Assistance Recommended at Discharge Intermittent Supervision/Assistance  If plan is discharge home, recommend the following:  Can travel by private vehicle  A little help with walking and/or transfers;A little help with bathing/dressing/bathroom;Assistance with cooking/housework;Assist for transportation;Help with stairs or ramp for entrance        Equipment Recommendations None recommended by PT (DME in home  setting)  Recommendations for Other Services       Functional Status Assessment Patient has had a recent decline in their functional status and demonstrates the ability to make significant improvements in function in a reasonable and predictable amount of time.     Precautions / Restrictions        Mobility  Bed Mobility Overal bed mobility: Needs Assistance Bed Mobility: Supine to Sit     Supine to sit: Supervision     General bed mobility comments: min cues for safety and controlled movements    Transfers Overall transfer level: Needs assistance Equipment used: Rolling walker (2 wheels) Transfers: Sit to/from Stand Sit to Stand: Min guard           General transfer comment: cues for proper UE and AD placement    Ambulation/Gait Ambulation/Gait assistance: Supervision Gait Distance (Feet): 50 Feet Assistive device: Rolling walker (2 wheels) Gait Pattern/deviations: Step-to pattern, Trunk flexed, Wide base of support (B toe in) Gait velocity: decreased     General Gait Details: cues t/o Eval to use RW, pt making attempts to amb without AD  Stairs Stairs: Yes Stairs assistance: Min guard Stair Management: Two rails Number of Stairs: 3 General stair comments: cues for step to pattern, sequencing and technique. regardless of cues pt performing reciprocal pattern with first trial on step navigation pt able to modify and follow commands  Wheelchair Mobility     Tilt Bed    Modified Rankin (Stroke Patients Only)       Balance Overall balance assessment: Needs assistance Sitting-balance support: Feet supported Sitting balance-Leahy Scale: Good     Standing balance support:  Bilateral upper extremity supported, During functional activity, Reliant on assistive device for balance Standing balance-Leahy Scale: Fair Standing balance comment: static standing no UE support                             Pertinent Vitals/Pain Pain Assessment Pain  Assessment: 0-10 Pain Score: 6  Pain Location: L hip Pain Descriptors / Indicators: Aching, Constant, Discomfort, Operative site guarding Pain Intervention(s): Limited activity within patient's tolerance, Monitored during session, Premedicated before session, Repositioned, Ice applied    Home Living Family/patient expects to be discharged to:: Private residence Living Arrangements: Spouse/significant other Available Help at Discharge: Family;Friend(s) Type of Home: House Home Access: Stairs to enter Entrance Stairs-Rails: Right;Left;Can reach both Secretary/administrator of Steps: 4   Home Layout: One level Home Equipment: Cane - single Librarian, academic (2 wheels)      Prior Function Prior Level of Function : Independent/Modified Independent             Mobility Comments: mod I with SPC for all functional mobility tasks, ADLs, self care tasks, IADLs,       Hand Dominance        Extremity/Trunk Assessment        Lower Extremity Assessment Lower Extremity Assessment: LLE deficits/detail LLE Deficits / Details: ankle DF/PF 5/5 LLE Sensation: WNL    Cervical / Trunk Assessment Cervical / Trunk Assessment:  (wfl)  Communication   Communication: No difficulties  Cognition Arousal/Alertness: Awake/alert Behavior During Therapy: WFL for tasks assessed/performed Overall Cognitive Status: Within Functional Limits for tasks assessed                                          General Comments      Exercises Total Joint Exercises Ankle Circles/Pumps: AROM, Both, 20 reps Quad Sets: AROM, Left, 5 reps Heel Slides: AROM, Left, 5 reps Hip ABduction/ADduction: AROM, Left, 5 reps Long Arc Quad: AROM, Left, 5 reps Knee Flexion: AROM, Left, 5 reps, Standing Marching in Standing: AROM, Left, 5 reps Standing Hip Extension: AROM, Left, 5 reps   Assessment/Plan    PT Assessment Patient needs continued PT services  PT Problem List Decreased  strength;Decreased range of motion;Decreased activity tolerance;Decreased balance;Decreased mobility;Decreased coordination;Decreased safety awareness;Pain       PT Treatment Interventions DME instruction;Gait training;Stair training;Functional mobility training;Therapeutic activities;Therapeutic exercise;Balance training;Neuromuscular re-education;Patient/family education;Modalities    PT Goals (Current goals can be found in the Care Plan section)  Acute Rehab PT Goals Patient Stated Goal: to be able to go fishing and walk normally PT Goal Formulation: With patient Time For Goal Achievement: 10/05/22 Potential to Achieve Goals: Good    Frequency 7X/week     Co-evaluation               AM-PAC PT "6 Clicks" Mobility  Outcome Measure Help needed turning from your back to your side while in a flat bed without using bedrails?: None Help needed moving from lying on your back to sitting on the side of a flat bed without using bedrails?: None Help needed moving to and from a bed to a chair (including a wheelchair)?: A Little Help needed standing up from a chair using your arms (e.g., wheelchair or bedside chair)?: A Little Help needed to walk in hospital room?: A Little Help needed climbing 3-5 steps with a railing? : A Little  6 Click Score: 20    End of Session Equipment Utilized During Treatment: Gait belt Activity Tolerance: Patient tolerated treatment well Patient left: in chair;with call bell/phone within reach Nurse Communication: Mobility status;Other (comment) (pt readiness for d/c from PT standpoint) PT Visit Diagnosis: Unsteadiness on feet (R26.81);Other abnormalities of gait and mobility (R26.89);Muscle weakness (generalized) (M62.81);Pain;Difficulty in walking, not elsewhere classified (R26.2) Pain - Right/Left: Left Pain - part of body: Leg;Ankle and joints of foot;Hip    Time: 1610-9604 PT Time Calculation (min) (ACUTE ONLY): 25 min   Charges:   PT  Evaluation $PT Eval Low Complexity: 1 Low PT Treatments $Gait Training: 8-22 mins PT General Charges $$ ACUTE PT VISIT: 1 Visit         Johnny Bridge, PT Acute Rehab   Jacqualyn Posey 09/21/2022, 1:47 PM

## 2022-09-21 NOTE — Discharge Instructions (Signed)

## 2022-09-21 NOTE — Anesthesia Procedure Notes (Signed)
Spinal  Patient location during procedure: OR Start time: 09/21/2022 7:28 AM End time: 09/21/2022 7:32 AM Reason for block: surgical anesthesia Staffing Performed: anesthesiologist  Anesthesiologist: Trevor Iha, MD Performed by: Trevor Iha, MD Authorized by: Trevor Iha, MD   Preanesthetic Checklist Completed: patient identified, IV checked, risks and benefits discussed, surgical consent, monitors and equipment checked, pre-op evaluation and timeout performed Spinal Block Patient position: sitting Prep: DuraPrep and site prepped and draped Patient monitoring: heart rate, cardiac monitor, continuous pulse ox and blood pressure Approach: midline Location: L3-4 Injection technique: single-shot Needle Needle type: Pencan  Needle gauge: 24 G Needle length: 10 cm Needle insertion depth: 7 cm Assessment Sensory level: T4 Events: CSF return Additional Notes  1 Attempt (s). Pt tolerated procedure well.

## 2022-09-21 NOTE — Transfer of Care (Signed)
Immediate Anesthesia Transfer of Care Note  Patient: Jonathan Carney  Procedure(s) Performed: TOTAL HIP ARTHROPLASTY (Left: Hip)  Patient Location: PACU  Anesthesia Type:Spinal  Level of Consciousness: awake, alert , oriented, and patient cooperative  Airway & Oxygen Therapy: Patient Spontanous Breathing and Patient connected to face mask oxygen  Post-op Assessment: Report given to RN and Post -op Vital signs reviewed and stable  Post vital signs: Reviewed and stable  Last Vitals:  Vitals Value Taken Time  BP 120/76 09/21/22 0937  Temp    Pulse 68 09/21/22 0939  Resp 19 09/21/22 0939  SpO2 95 % 09/21/22 0939  Vitals shown include unfiled device data.  Last Pain:  Vitals:   09/21/22 0614  TempSrc: Oral  PainSc:       Patients Stated Pain Goal: 5 (09/21/22 0551)  Complications: No notable events documented.

## 2022-09-22 ENCOUNTER — Encounter (HOSPITAL_COMMUNITY): Payer: Self-pay | Admitting: Orthopedic Surgery

## 2022-10-01 ENCOUNTER — Ambulatory Visit: Payer: Medicaid Other | Admitting: Physical Therapy

## 2022-10-11 NOTE — Therapy (Unsigned)
OUTPATIENT PHYSICAL THERAPY LOWER EXTREMITY EVALUATION   Patient Name: Jonathan Carney MRN: [id number] DOB:1960-06-05, 62 y.o., male Today's Date: 10/12/2022  END OF SESSION:  PT End of Session - 10/12/22 1059     Visit Number 1    Number of Visits 6    Date for PT Re-Evaluation 12/12/22    Authorization Type [employer] MCD    PT Start Time 1100    PT Stop Time 1140    PT Time Calculation (min) 40 min    Activity Tolerance Patient tolerated treatment well    Behavior During Therapy WFL for tasks assessed/performed             Past Medical History:  Diagnosis Date   Arthritis    Borderline high cholesterol    Depression    GERD (gastroesophageal reflux disease)    occais, no meds   Hypertension    Smoker    Past Surgical History:  Procedure Laterality Date   ANTERIOR CERVICAL DECOMP/DISCECTOMY FUSION N/A 04/08/2018   Procedure: Cervical Five-Six, Cervical Six-Seven Anterior cervical decompression/discectomy/fusion;  Surgeon: Tia Alert, MD;  Location: [facility] OR;  Service: Neurosurgery;  Laterality: N/A;  anterior   EYE SURGERY  to straighten it out   Right   I & D EXTREMITY Right 05/30/2020   Procedure: IRRIGATION AND DEBRIDEMENT OF RIGHT SMALL  FINGER;  Surgeon: Ernest Mallick, MD;  Location: [facility] OR;  Service: Orthopedics;  Laterality: Right;   INCISION AND DRAINAGE OF WOUND Right 06/17/2020   Procedure: Right small finger irrigation and debridement, application of integra;  Surgeon: Ernest Mallick, MD;  Location: [facility];  Service: Orthopedics;  Laterality: Right;  5TH   TOOTH EXTRACTION N/A 06/05/2022   Procedure: EXTRACTION TEETH NUMBER THREE, FOUR, FIVE, SIX, SEVEN, EIGHT, NINE, TEN, ELEVEN, FIFTEEN, SIXTEEN, TWENTY, TWENTY-ONE, TWENTY-TWO, TWENTY-THREE, TWENTY-SEVEN, THIRTY TWO, ALVEOLOPLASTY, AND REMOVAL OF BILATERAL LINGUAL TORI;  Surgeon: Ocie Doyne, DMD;  Location: [facility] OR;  Service: Oral Surgery;  Laterality: N/A;   TOTAL HIP ARTHROPLASTY  Right 07/29/2022   Procedure: TOTAL HIP ARTHROPLASTY;  Surgeon: Joen Laura, MD;  Location: [facility];  Service: Orthopedics;  Laterality: Right;   TOTAL HIP ARTHROPLASTY Left 09/21/2022   Procedure: TOTAL HIP ARTHROPLASTY;  Surgeon: Joen Laura, MD;  Location: [facility];  Service: Orthopedics;  Laterality: Left;   Patient Active Problem List   Diagnosis Date Noted   Cellulitis of hand 05/29/2020   S/P cervical spinal fusion 04/08/2018    PCP: Fleet Contras, MD   REFERRING PROVIDER: Joen Laura, MD  REFERRING DIAG: s/p L THA  7/15  THERAPY DIAG:  Unsteadiness on feet - Plan: PT plan of care cert/re-cert  Decreased strength - Plan: PT plan of care cert/re-cert  H/O total hip arthroplasty, left - Plan: PT plan of care cert/re-cert  Rationale for Evaluation and Treatment: Rehabilitation  ONSET DATE: 09/21/2022  SUBJECTIVE:   SUBJECTIVE STATEMENT: S/P L THA anterior approach, very little pain.  Only reports difficulty when stepping into van leading with LLE  PERTINENT HISTORY: 62 yo male presents to therapy s/p L THA, posterior lateral approach on 08/22/2022 due to failure of conservative measures. Pt is L LE WBAT and no formal hip precautions. Pt PMH includes but is not limited to: cervical spine fusion, cellulitis of hand,HTN, tobacco abuse and R THA, posterior lateral approach on 07/29/2022.  PAIN:  Are you having pain? Yes: NPRS scale: 5/10 Pain location: L hip Pain description: ache Aggravating factors:  stepping Relieving factors: rest  PRECAUTIONS: Anterior hip  RED FLAGS: None   WEIGHT BEARING RESTRICTIONS: No  FALLS:  Has patient fallen in last 6 months? No  OCCUPATION: not working  PLOF: Independent  PATIENT GOALS: To return to fishing  NEXT MD VISIT: 3-4 weeks  OBJECTIVE:   DIAGNOSTIC FINDINGS:  Narrative & Impression  CLINICAL DATA:  Left hip arthroplasty.   EXAM: DG HIP (WITH OR WITHOUT PELVIS) 2-3V LEFT   COMPARISON:   09/21/2022   FINDINGS: Two views of the left hip were obtained. Left total hip arthroplasty is located without a periprosthetic fracture. Right hip arthroplasty appears located. Visualized pelvic bony ring is intact.   IMPRESSION: Left hip arthroplasty without complicating features.     Electronically Signed   By: Richarda Overlie M.D.   On: 09/21/2022 12:15      PATIENT SURVEYS:  FOTO 85(79 predicted)  MUSCLE LENGTH: Hamstrings: Right 90 deg; Left 90 deg  POSTURE:  slightly flexed at hips   LOWER EXTREMITY ROM:  Active ROM Right eval Left eval  Hip flexion  100d  Hip extension  -5d  Hip abduction  30d  Hip adduction    Hip internal rotation    Hip external rotation    Knee flexion    Knee extension    Ankle dorsiflexion    Ankle plantarflexion    Ankle inversion    Ankle eversion     (Blank rows = not tested)  LOWER EXTREMITY MMT:  MMT Right eval Left eval  Hip flexion  3+  Hip extension  3+  Hip abduction  3  Hip adduction    Hip internal rotation    Hip external rotation    Knee flexion    Knee extension    Ankle dorsiflexion    Ankle plantarflexion    Ankle inversion    Ankle eversion     (Blank rows = not tested)  LOWER EXTREMITY SPECIAL TESTS:  deferred  FUNCTIONAL TESTS:  5 times sit to stand: 18s arms crossed  GAIT: Distance walked: 27ftx2 Assistive device utilized: None Level of assistance: Complete Independence Comments: decreased rotational motion at pelvis   TODAY'S TREATMENT:                                                                                                                              DATE: Eval and HEP    PATIENT EDUCATION:  Education details: Discussed eval findings, rehab rationale and POC and patient is in agreement  Person educated: Patient Education method: Explanation Education comprehension: verbalized understanding and needs further education  HOME EXERCISE PROGRAM: Access Code: [email or web address] URL:  [email or web address] Date: 10/12/2022 Prepared by: Gustavus Bryant  Exercises - Bridge  - 2 x daily - 5 x weekly - 1 sets - 10 reps - Sit to Stand with Arms Crossed  - 2 x daily - 5 x weekly - 1 sets - 5 reps - Clamshell  - 2 x  daily - 5 x weekly - 1 sets - 10 reps - Modified Thomas Stretch  - 2 x daily - 5 x weekly - 1 sets - 2 reps - 30s hold  ASSESSMENT:  CLINICAL IMPRESSION: Patient is a 62 y.o. male who was seen today for physical therapy evaluation and treatment for L hip ROM and strength deficits following THA on 09/21/22 via anterior approach.  He has little to no pain to address.  He has returned to I ambulation w/o need of AD.  Primary focus of OPPT will be strengthening and regaining L hip extension ROM.    OBJECTIVE IMPAIRMENTS: Abnormal gait, decreased activity tolerance, decreased endurance, decreased knowledge of condition, decreased mobility, difficulty walking, decreased ROM, decreased strength, improper body mechanics, and obesity.   ACTIVITY LIMITATIONS: carrying, lifting, squatting, stairs, and locomotion level  PERSONAL FACTORS: Age, Fitness, Past/current experiences, and 1 comorbidity: concurrent R THA  are also affecting patient's functional outcome.   REHAB POTENTIAL: Good  CLINICAL DECISION MAKING: Stable/uncomplicated  EVALUATION COMPLEXITY: Low   GOALS: Goals reviewed with patient? No  SHORT TERM GOALS: Target date: 11/23/2022   Patient to demonstrate independence in HEP  Baseline: [id number] Goal status: INITIAL  2.  Increase L hip extension to 5d Baseline: -5d extension Goal status: INITIAL  3.  Increase L hip strength to 4/5 Baseline:  MMT Right eval Left eval  Hip flexion  3+  Hip extension  3+  Hip abduction  3   Goal status: INITIAL  4.  Decrease 5x STS time to <15s arms crossed Baseline: 18s arms crossed Goal status: INITIAL   PLAN:  PT FREQUENCY: 1-2x/week  PT DURATION: 6 weeks  PLANNED INTERVENTIONS:  Therapeutic exercises, Therapeutic activity, Neuromuscular re-education, Balance training, Gait training, Patient/Family education, Self Care, Joint mobilization, Stair training, Dry Needling, Electrical stimulation, Cryotherapy, Moist heat, Manual therapy, and Re-evaluation  PLAN FOR NEXT SESSION: HEP review and update, manual techniques as appropriate, aerobic tasks, ROM and flexibility activities, strengthening and PREs, TPDN, gait and balance training as needed     Hildred Laser, PT 10/12/2022, 11:53 AM   Check all possible CPT codes: 62952 - PT Re-evaluation, 97110- Therapeutic Exercise, [phone]- Neuro Re-education, [phone] - Gait Training, [phone] - Manual Therapy, 97530 - Therapeutic Activities, and 97535 - Self Care    Check all conditions that are expected to impact treatment: {Conditions expected to impact treatment:Musculoskeletal disorders and Contractures, spasticity or fracture relevant to requested treatment   If treatment provided at initial evaluation, no treatment charged due to lack of authorization.

## 2022-10-12 ENCOUNTER — Other Ambulatory Visit: Payer: Self-pay

## 2022-10-12 ENCOUNTER — Ambulatory Visit: Payer: Medicaid Other | Attending: Orthopedic Surgery

## 2022-10-12 DIAGNOSIS — R531 Weakness: Secondary | ICD-10-CM | POA: Diagnosis present

## 2022-10-12 DIAGNOSIS — Z96642 Presence of left artificial hip joint: Secondary | ICD-10-CM | POA: Diagnosis present

## 2022-10-12 DIAGNOSIS — R2681 Unsteadiness on feet: Secondary | ICD-10-CM | POA: Diagnosis not present

## 2022-10-14 ENCOUNTER — Ambulatory Visit: Payer: Medicaid Other

## 2022-10-16 NOTE — Therapy (Deleted)
OUTPATIENT PHYSICAL THERAPY TREATMENT NOTE   Patient Name: Jonathan Carney MRN: [id number] DOB:01-18-1961, 62 y.o., male Today's Date: 10/16/2022  END OF SESSION:    Past Medical History:  Diagnosis Date   Arthritis    Borderline high cholesterol    Depression    GERD (gastroesophageal reflux disease)    occais, no meds   Hypertension    Smoker    Past Surgical History:  Procedure Laterality Date   ANTERIOR CERVICAL DECOMP/DISCECTOMY FUSION N/A 04/08/2018   Procedure: Cervical Five-Six, Cervical Six-Seven Anterior cervical decompression/discectomy/fusion;  Surgeon: Tia Alert, MD;  Location: [facility] OR;  Service: Neurosurgery;  Laterality: N/A;  anterior   EYE SURGERY  to straighten it out   Right   I & D EXTREMITY Right 05/30/2020   Procedure: IRRIGATION AND DEBRIDEMENT OF RIGHT SMALL  FINGER;  Surgeon: Ernest Mallick, MD;  Location: [facility] OR;  Service: Orthopedics;  Laterality: Right;   INCISION AND DRAINAGE OF WOUND Right 06/17/2020   Procedure: Right small finger irrigation and debridement, application of integra;  Surgeon: Ernest Mallick, MD;  Location: [facility];  Service: Orthopedics;  Laterality: Right;  5TH   TOOTH EXTRACTION N/A 06/05/2022   Procedure: EXTRACTION TEETH NUMBER THREE, FOUR, FIVE, SIX, SEVEN, EIGHT, NINE, TEN, ELEVEN, FIFTEEN, SIXTEEN, TWENTY, TWENTY-ONE, TWENTY-TWO, TWENTY-THREE, TWENTY-SEVEN, THIRTY TWO, ALVEOLOPLASTY, AND REMOVAL OF BILATERAL LINGUAL TORI;  Surgeon: Ocie Doyne, DMD;  Location: [facility] OR;  Service: Oral Surgery;  Laterality: N/A;   TOTAL HIP ARTHROPLASTY Right 07/29/2022   Procedure: TOTAL HIP ARTHROPLASTY;  Surgeon: Joen Laura, MD;  Location: [facility];  Service: Orthopedics;  Laterality: Right;   TOTAL HIP ARTHROPLASTY Left 09/21/2022   Procedure: TOTAL HIP ARTHROPLASTY;  Surgeon: Joen Laura, MD;  Location: [facility];  Service: Orthopedics;  Laterality: Left;   Patient Active Problem List   Diagnosis  Date Noted   Cellulitis of hand 05/29/2020   S/P cervical spinal fusion 04/08/2018    PCP: Fleet Contras, MD   REFERRING PROVIDER: Joen Laura, MD  REFERRING DIAG: s/p L THA  7/15  THERAPY DIAG:  No diagnosis found.  Rationale for Evaluation and Treatment: Rehabilitation  ONSET DATE: 09/21/2022  SUBJECTIVE:   SUBJECTIVE STATEMENT: *** S/P L THA anterior approach, very little pain.  Only reports difficulty when stepping into van leading with LLE  PERTINENT HISTORY: 62 yo male presents to therapy s/p L THA, posterior lateral approach on 08/22/2022 due to failure of conservative measures. Pt is L LE WBAT and no formal hip precautions. Pt PMH includes but is not limited to: cervical spine fusion, cellulitis of hand,HTN, tobacco abuse and R THA, posterior lateral approach on 07/29/2022.  PAIN:  Are you having pain? Yes: NPRS scale: *** 5/10 Pain location: L hip Pain description: ache Aggravating factors: stepping Relieving factors: rest  PRECAUTIONS: Anterior hip  RED FLAGS: None   WEIGHT BEARING RESTRICTIONS: No  FALLS:  Has patient fallen in last 6 months? No  OCCUPATION: not working  PLOF: Independent  PATIENT GOALS: To return to fishing  NEXT MD VISIT: 3-4 weeks  OBJECTIVE:   DIAGNOSTIC FINDINGS:  Narrative & Impression  CLINICAL DATA:  Left hip arthroplasty.   EXAM: DG HIP (WITH OR WITHOUT PELVIS) 2-3V LEFT   COMPARISON:  09/21/2022   FINDINGS: Two views of the left hip were obtained. Left total hip arthroplasty is located without a periprosthetic fracture. Right hip arthroplasty appears located. Visualized pelvic bony ring is intact.   IMPRESSION:  Left hip arthroplasty without complicating features.     Electronically Signed   By: Richarda Overlie M.D.   On: 09/21/2022 12:15      PATIENT SURVEYS:  FOTO 85(79 predicted)  MUSCLE LENGTH: Hamstrings: Right 90 deg; Left 90 deg  POSTURE:  slightly flexed at hips   LOWER EXTREMITY  ROM:  Active ROM Right eval Left eval  Hip flexion  100d  Hip extension  -5d  Hip abduction  30d  Hip adduction    Hip internal rotation    Hip external rotation    Knee flexion    Knee extension    Ankle dorsiflexion    Ankle plantarflexion    Ankle inversion    Ankle eversion     (Blank rows = not tested)  LOWER EXTREMITY MMT:  MMT Right eval Left eval  Hip flexion  3+  Hip extension  3+  Hip abduction  3  Hip adduction    Hip internal rotation    Hip external rotation    Knee flexion    Knee extension    Ankle dorsiflexion    Ankle plantarflexion    Ankle inversion    Ankle eversion     (Blank rows = not tested)  LOWER EXTREMITY SPECIAL TESTS:  deferred  FUNCTIONAL TESTS:  5 times sit to stand: 18s arms crossed  GAIT: Distance walked: 69ftx2 Assistive device utilized: None Level of assistance: Complete Independence Comments: decreased rotational motion at pelvis   TODAY'S TREATMENT:         [facility] Adult PT Treatment:                                                DATE: 10/14/22 Therapeutic Exercise: Nustep level 5 x 5 mins while gathering subjective info Sidestepping at counter x4 laps Standing hip abduction/extension 2x10 each BIL Standing marching 3x30" STS arms crossed x10 STS 5# KB x10 Step ups 6" fwd 2x10 BIL Step ups 4" lat 2x10 BIL Mini squats at counter 2x10 Hurdle step over fwd/lat x4 laps                                                                                                                         DATE: Eval and HEP    PATIENT EDUCATION:  Education details: Discussed eval findings, rehab rationale and POC and patient is in agreement  Person educated: Patient Education method: Explanation Education comprehension: verbalized understanding and needs further education  HOME EXERCISE PROGRAM: Access Code: [email or web address] URL: [email or web address] Date: 10/12/2022 Prepared by: Gustavus Bryant  Exercises - Bridge  -  2 x daily - 5 x weekly - 1 sets - 10 reps - Sit to Stand with Arms Crossed  - 2 x daily - 5 x weekly - 1 sets - 5 reps - Clamshell  - 2 x daily - 5 x  weekly - 1 sets - 10 reps - Modified Thomas Stretch  - 2 x daily - 5 x weekly - 1 sets - 2 reps - 30s hold  ASSESSMENT:  CLINICAL IMPRESSION: ***  Patient is a 62 y.o. male who was seen today for physical therapy evaluation and treatment for L hip ROM and strength deficits following THA on 09/21/22 via anterior approach.  He has little to no pain to address.  He has returned to I ambulation w/o need of AD.  Primary focus of OPPT will be strengthening and regaining L hip extension ROM.    OBJECTIVE IMPAIRMENTS: Abnormal gait, decreased activity tolerance, decreased endurance, decreased knowledge of condition, decreased mobility, difficulty walking, decreased ROM, decreased strength, improper body mechanics, and obesity.   ACTIVITY LIMITATIONS: carrying, lifting, squatting, stairs, and locomotion level  PERSONAL FACTORS: Age, Fitness, Past/current experiences, and 1 comorbidity: concurrent R THA  are also affecting patient's functional outcome.   REHAB POTENTIAL: Good  CLINICAL DECISION MAKING: Stable/uncomplicated  EVALUATION COMPLEXITY: Low   GOALS: Goals reviewed with patient? No  SHORT TERM GOALS: Target date: 11/23/2022   Patient to demonstrate independence in HEP  Baseline: [id number] Goal status: INITIAL  2.  Increase L hip extension to 5d Baseline: -5d extension Goal status: INITIAL  3.  Increase L hip strength to 4/5 Baseline:  MMT Right eval Left eval  Hip flexion  3+  Hip extension  3+  Hip abduction  3   Goal status: INITIAL  4.  Decrease 5x STS time to <15s arms crossed Baseline: 18s arms crossed Goal status: INITIAL   PLAN:  PT FREQUENCY: 1-2x/week  PT DURATION: 6 weeks  PLANNED INTERVENTIONS: Therapeutic exercises, Therapeutic activity, Neuromuscular re-education, Balance training, Gait training,  Patient/Family education, Self Care, Joint mobilization, Stair training, Dry Needling, Electrical stimulation, Cryotherapy, Moist heat, Manual therapy, and Re-evaluation  PLAN FOR NEXT SESSION: HEP review and update, manual techniques as appropriate, aerobic tasks, ROM and flexibility activities, strengthening and PREs, TPDN, gait and balance training as needed     Hildred Laser, PT 10/16/2022, 8:06 AM   Check all possible CPT codes: 86578 - PT Re-evaluation, 97110- Therapeutic Exercise, [phone]- Neuro Re-education, [phone] - Gait Training, [phone] - Manual Therapy, 97530 - Therapeutic Activities, and 97535 - Self Care    Check all conditions that are expected to impact treatment: {Conditions expected to impact treatment:Musculoskeletal disorders and Contractures, spasticity or fracture relevant to requested treatment   If treatment provided at initial evaluation, no treatment charged due to lack of authorization.

## 2022-10-19 ENCOUNTER — Ambulatory Visit: Payer: Medicaid Other

## 2022-10-21 ENCOUNTER — Ambulatory Visit: Payer: Medicaid Other

## 2022-10-23 NOTE — Therapy (Deleted)
OUTPATIENT PHYSICAL THERAPY TREATMENT NOTE   Patient Name: Jonathan Carney MRN: [id number] DOB:Jun 10, 1960, 62 y.o., male Today's Date: 10/23/2022  END OF SESSION:    Past Medical History:  Diagnosis Date   Arthritis    Borderline high cholesterol    Depression    GERD (gastroesophageal reflux disease)    occais, no meds   Hypertension    Smoker    Past Surgical History:  Procedure Laterality Date   ANTERIOR CERVICAL DECOMP/DISCECTOMY FUSION N/A 04/08/2018   Procedure: Cervical Five-Six, Cervical Six-Seven Anterior cervical decompression/discectomy/fusion;  Surgeon: Tia Alert, MD;  Location: [facility] OR;  Service: Neurosurgery;  Laterality: N/A;  anterior   EYE SURGERY  to straighten it out   Right   I & D EXTREMITY Right 05/30/2020   Procedure: IRRIGATION AND DEBRIDEMENT OF RIGHT SMALL  FINGER;  Surgeon: Ernest Mallick, MD;  Location: [facility] OR;  Service: Orthopedics;  Laterality: Right;   INCISION AND DRAINAGE OF WOUND Right 06/17/2020   Procedure: Right small finger irrigation and debridement, application of integra;  Surgeon: Ernest Mallick, MD;  Location: [facility];  Service: Orthopedics;  Laterality: Right;  5TH   TOOTH EXTRACTION N/A 06/05/2022   Procedure: EXTRACTION TEETH NUMBER THREE, FOUR, FIVE, SIX, SEVEN, EIGHT, NINE, TEN, ELEVEN, FIFTEEN, SIXTEEN, TWENTY, TWENTY-ONE, TWENTY-TWO, TWENTY-THREE, TWENTY-SEVEN, THIRTY TWO, ALVEOLOPLASTY, AND REMOVAL OF BILATERAL LINGUAL TORI;  Surgeon: Ocie Doyne, DMD;  Location: [facility] OR;  Service: Oral Surgery;  Laterality: N/A;   TOTAL HIP ARTHROPLASTY Right 07/29/2022   Procedure: TOTAL HIP ARTHROPLASTY;  Surgeon: Joen Laura, MD;  Location: [facility];  Service: Orthopedics;  Laterality: Right;   TOTAL HIP ARTHROPLASTY Left 09/21/2022   Procedure: TOTAL HIP ARTHROPLASTY;  Surgeon: Joen Laura, MD;  Location: [facility];  Service: Orthopedics;  Laterality: Left;   Patient Active Problem List   Diagnosis  Date Noted   Cellulitis of hand 05/29/2020   S/P cervical spinal fusion 04/08/2018    PCP: Fleet Contras, MD   REFERRING PROVIDER: Joen Laura, MD  REFERRING DIAG: s/p L THA  7/15  THERAPY DIAG:  No diagnosis found.  Rationale for Evaluation and Treatment: Rehabilitation  ONSET DATE: 09/21/2022  SUBJECTIVE:   SUBJECTIVE STATEMENT: *** S/P L THA anterior approach, very little pain.  Only reports difficulty when stepping into van leading with LLE  PERTINENT HISTORY: 62 yo male presents to therapy s/p L THA, posterior lateral approach on 08/22/2022 due to failure of conservative measures. Pt is L LE WBAT and no formal hip precautions. Pt PMH includes but is not limited to: cervical spine fusion, cellulitis of hand,HTN, tobacco abuse and R THA, posterior lateral approach on 07/29/2022.  PAIN:  Are you having pain? Yes: NPRS scale: *** 5/10 Pain location: L hip Pain description: ache Aggravating factors: stepping Relieving factors: rest  PRECAUTIONS: Anterior hip  RED FLAGS: None   WEIGHT BEARING RESTRICTIONS: No  FALLS:  Has patient fallen in last 6 months? No  OCCUPATION: not working  PLOF: Independent  PATIENT GOALS: To return to fishing  NEXT MD VISIT: 3-4 weeks  OBJECTIVE:   DIAGNOSTIC FINDINGS:  Narrative & Impression  CLINICAL DATA:  Left hip arthroplasty.   EXAM: DG HIP (WITH OR WITHOUT PELVIS) 2-3V LEFT   COMPARISON:  09/21/2022   FINDINGS: Two views of the left hip were obtained. Left total hip arthroplasty is located without a periprosthetic fracture. Right hip arthroplasty appears located. Visualized pelvic bony ring is intact.   IMPRESSION:  Left hip arthroplasty without complicating features.     Electronically Signed   By: Richarda Overlie M.D.   On: 09/21/2022 12:15      PATIENT SURVEYS:  FOTO 85(79 predicted)  MUSCLE LENGTH: Hamstrings: Right 90 deg; Left 90 deg  POSTURE:  slightly flexed at hips   LOWER EXTREMITY  ROM:  Active ROM Right eval Left eval  Hip flexion  100d  Hip extension  -5d  Hip abduction  30d  Hip adduction    Hip internal rotation    Hip external rotation    Knee flexion    Knee extension    Ankle dorsiflexion    Ankle plantarflexion    Ankle inversion    Ankle eversion     (Blank rows = not tested)  LOWER EXTREMITY MMT:  MMT Right eval Left eval  Hip flexion  3+  Hip extension  3+  Hip abduction  3  Hip adduction    Hip internal rotation    Hip external rotation    Knee flexion    Knee extension    Ankle dorsiflexion    Ankle plantarflexion    Ankle inversion    Ankle eversion     (Blank rows = not tested)  LOWER EXTREMITY SPECIAL TESTS:  deferred  FUNCTIONAL TESTS:  5 times sit to stand: 18s arms crossed  GAIT: Distance walked: 42ftx2 Assistive device utilized: None Level of assistance: Complete Independence Comments: decreased rotational motion at pelvis   TODAY'S TREATMENT:         [facility] Adult PT Treatment:                                                DATE: 10/14/22 Therapeutic Exercise: Nustep level 5 x 5 mins while gathering subjective info Sidestepping at counter x4 laps Standing hip abduction/extension 2x10 each BIL Standing marching 3x30" STS arms crossed x10 STS 5# KB x10 Step ups 6" fwd 2x10 BIL Step ups 4" lat 2x10 BIL Mini squats at counter 2x10 Hurdle step over fwd/lat x4 laps                                                                                                                         DATE: Eval and HEP    PATIENT EDUCATION:  Education details: Discussed eval findings, rehab rationale and POC and patient is in agreement  Person educated: Patient Education method: Explanation Education comprehension: verbalized understanding and needs further education  HOME EXERCISE PROGRAM: Access Code: [email or web address] URL: [email or web address] Date: 10/12/2022 Prepared by: Gustavus Bryant  Exercises - Bridge  -  2 x daily - 5 x weekly - 1 sets - 10 reps - Sit to Stand with Arms Crossed  - 2 x daily - 5 x weekly - 1 sets - 5 reps - Clamshell  - 2 x daily - 5 x  weekly - 1 sets - 10 reps - Modified Thomas Stretch  - 2 x daily - 5 x weekly - 1 sets - 2 reps - 30s hold  ASSESSMENT:  CLINICAL IMPRESSION: ***  Patient is a 62 y.o. male who was seen today for physical therapy evaluation and treatment for L hip ROM and strength deficits following THA on 09/21/22 via anterior approach.  He has little to no pain to address.  He has returned to I ambulation w/o need of AD.  Primary focus of OPPT will be strengthening and regaining L hip extension ROM.    OBJECTIVE IMPAIRMENTS: Abnormal gait, decreased activity tolerance, decreased endurance, decreased knowledge of condition, decreased mobility, difficulty walking, decreased ROM, decreased strength, improper body mechanics, and obesity.   ACTIVITY LIMITATIONS: carrying, lifting, squatting, stairs, and locomotion level  PERSONAL FACTORS: Age, Fitness, Past/current experiences, and 1 comorbidity: concurrent R THA  are also affecting patient's functional outcome.   REHAB POTENTIAL: Good  CLINICAL DECISION MAKING: Stable/uncomplicated  EVALUATION COMPLEXITY: Low   GOALS: Goals reviewed with patient? No  SHORT TERM GOALS: Target date: 11/23/2022   Patient to demonstrate independence in HEP  Baseline: [id number] Goal status: INITIAL  2.  Increase L hip extension to 5d Baseline: -5d extension Goal status: INITIAL  3.  Increase L hip strength to 4/5 Baseline:  MMT Right eval Left eval  Hip flexion  3+  Hip extension  3+  Hip abduction  3   Goal status: INITIAL  4.  Decrease 5x STS time to <15s arms crossed Baseline: 18s arms crossed Goal status: INITIAL   PLAN:  PT FREQUENCY: 1-2x/week  PT DURATION: 6 weeks  PLANNED INTERVENTIONS: Therapeutic exercises, Therapeutic activity, Neuromuscular re-education, Balance training, Gait training,  Patient/Family education, Self Care, Joint mobilization, Stair training, Dry Needling, Electrical stimulation, Cryotherapy, Moist heat, Manual therapy, and Re-evaluation  PLAN FOR NEXT SESSION: HEP review and update, manual techniques as appropriate, aerobic tasks, ROM and flexibility activities, strengthening and PREs, TPDN, gait and balance training as needed     Hildred Laser, PT 10/23/2022, 1:37 PM   Check all possible CPT codes: 84696 - PT Re-evaluation, 97110- Therapeutic Exercise, [phone]- Neuro Re-education, [phone] - Gait Training, [phone] - Manual Therapy, 97530 - Therapeutic Activities, and 97535 - Self Care    Check all conditions that are expected to impact treatment: {Conditions expected to impact treatment:Musculoskeletal disorders and Contractures, spasticity or fracture relevant to requested treatment   If treatment provided at initial evaluation, no treatment charged due to lack of authorization.

## 2022-10-26 ENCOUNTER — Telehealth: Payer: Self-pay

## 2022-10-26 ENCOUNTER — Ambulatory Visit: Payer: Medicaid Other

## 2022-10-26 NOTE — Telephone Encounter (Signed)
TC due to missed visit.  VM left with instructions to call clinic to schedule additional visits if needed.

## 2022-11-05 NOTE — Therapy (Signed)
OUTPATIENT PHYSICAL THERAPY TREATMENT NOTE   Patient Name: Jonathan Carney MRN: [id number] DOB:05-09-1960, 62 y.o., male Today's Date: 11/06/2022  END OF SESSION:  PT End of Session - 11/06/22 1030     Visit Number 2    Number of Visits 6    Date for PT Re-Evaluation 12/12/22    Authorization Type [employer] MCD    PT Start Time 1030    PT Stop Time 1110    PT Time Calculation (min) 40 min    Activity Tolerance Patient tolerated treatment well    Behavior During Therapy WFL for tasks assessed/performed              Past Medical History:  Diagnosis Date   Arthritis    Borderline high cholesterol    Depression    GERD (gastroesophageal reflux disease)    occais, no meds   Hypertension    Smoker    Past Surgical History:  Procedure Laterality Date   ANTERIOR CERVICAL DECOMP/DISCECTOMY FUSION N/A 04/08/2018   Procedure: Cervical Five-Six, Cervical Six-Seven Anterior cervical decompression/discectomy/fusion;  Surgeon: Tia Alert, MD;  Location: [facility] OR;  Service: Neurosurgery;  Laterality: N/A;  anterior   EYE SURGERY  to straighten it out   Right   I & D EXTREMITY Right 05/30/2020   Procedure: IRRIGATION AND DEBRIDEMENT OF RIGHT SMALL  FINGER;  Surgeon: Ernest Mallick, MD;  Location: [facility] OR;  Service: Orthopedics;  Laterality: Right;   INCISION AND DRAINAGE OF WOUND Right 06/17/2020   Procedure: Right small finger irrigation and debridement, application of integra;  Surgeon: Ernest Mallick, MD;  Location: [facility];  Service: Orthopedics;  Laterality: Right;  5TH   TOOTH EXTRACTION N/A 06/05/2022   Procedure: EXTRACTION TEETH NUMBER THREE, FOUR, FIVE, SIX, SEVEN, EIGHT, NINE, TEN, ELEVEN, FIFTEEN, SIXTEEN, TWENTY, TWENTY-ONE, TWENTY-TWO, TWENTY-THREE, TWENTY-SEVEN, THIRTY TWO, ALVEOLOPLASTY, AND REMOVAL OF BILATERAL LINGUAL TORI;  Surgeon: Ocie Doyne, DMD;  Location: [facility] OR;  Service: Oral Surgery;  Laterality: N/A;   TOTAL HIP ARTHROPLASTY Right  07/29/2022   Procedure: TOTAL HIP ARTHROPLASTY;  Surgeon: Joen Laura, MD;  Location: [facility];  Service: Orthopedics;  Laterality: Right;   TOTAL HIP ARTHROPLASTY Left 09/21/2022   Procedure: TOTAL HIP ARTHROPLASTY;  Surgeon: Joen Laura, MD;  Location: [facility];  Service: Orthopedics;  Laterality: Left;   Patient Active Problem List   Diagnosis Date Noted   Cellulitis of hand 05/29/2020   S/P cervical spinal fusion 04/08/2018    PCP: Fleet Contras, MD   REFERRING PROVIDER: Joen Laura, MD  REFERRING DIAG: s/p L THA  7/15  THERAPY DIAG:  Unsteadiness on feet  Decreased strength  H/O total hip arthroplasty, left  Rationale for Evaluation and Treatment: Rehabilitation  ONSET DATE: 09/21/2022  SUBJECTIVE:   SUBJECTIVE STATEMENT:    PERTINENT HISTORY: 62 yo male presents to therapy s/p L THA, posterior lateral approach on 08/22/2022 due to failure of conservative measures. Pt is L LE WBAT and no formal hip precautions. Pt PMH includes but is not limited to: cervical spine fusion, cellulitis of hand,HTN, tobacco abuse and R THA, posterior lateral approach on 07/29/2022.  PAIN:  Are you having pain? Yes: NPRS scale: 0/10 Pain location: L hip Pain description: ache Aggravating factors: stepping Relieving factors: rest  PRECAUTIONS: Anterior hip  RED FLAGS: None   WEIGHT BEARING RESTRICTIONS: No  FALLS:  Has patient fallen in last 6 months? No  OCCUPATION: not working  PLOF: Independent  PATIENT  GOALS: To return to fishing  NEXT MD VISIT: 3-4 weeks  OBJECTIVE:   DIAGNOSTIC FINDINGS:  Narrative & Impression  CLINICAL DATA:  Left hip arthroplasty.   EXAM: DG HIP (WITH OR WITHOUT PELVIS) 2-3V LEFT   COMPARISON:  09/21/2022   FINDINGS: Two views of the left hip were obtained. Left total hip arthroplasty is located without a periprosthetic fracture. Right hip arthroplasty appears located. Visualized pelvic bony ring is intact.    IMPRESSION: Left hip arthroplasty without complicating features.     Electronically Signed   By: Richarda Overlie M.D.   On: 09/21/2022 12:15      PATIENT SURVEYS:  FOTO 85(79 predicted)  MUSCLE LENGTH: Hamstrings: Right 90 deg; Left 90 deg  POSTURE:  slightly flexed at hips   LOWER EXTREMITY ROM:  Active ROM Right eval Left eval  Hip flexion  100d  Hip extension  -5d  Hip abduction  30d  Hip adduction    Hip internal rotation    Hip external rotation    Knee flexion    Knee extension    Ankle dorsiflexion    Ankle plantarflexion    Ankle inversion    Ankle eversion     (Blank rows = not tested)  LOWER EXTREMITY MMT:  MMT Right eval Left eval  Hip flexion  3+  Hip extension  3+  Hip abduction  3  Hip adduction    Hip internal rotation    Hip external rotation    Knee flexion    Knee extension    Ankle dorsiflexion    Ankle plantarflexion    Ankle inversion    Ankle eversion     (Blank rows = not tested)  LOWER EXTREMITY SPECIAL TESTS:  deferred  FUNCTIONAL TESTS:  5 times sit to stand: 18s arms crossed  GAIT: Distance walked: 43ftx2 Assistive device utilized: None Level of assistance: Complete Independence Comments: decreased rotational motion at pelvis   TODAY'S TREATMENT:         [facility] Adult PT Treatment:                                                DATE: 11/06/22 Therapeutic Exercise: Nustep L2 8 min L hip flexor stretch 30s x2 Supine hip fallouts GTB 15x B, 15/15 unilaterally Bridge against GTB 15x S/L clams 15x B GTB Bridge with ball 15x SLR supine march 2# 15/15 FAQs 2# 15/15 Standing heel raises 15x Standing hamstring curls 2# 15/15 16 steps with B rail assist and step to/through pattern                                                                                                                       DATE: 10/12/22 Eval and HEP    PATIENT EDUCATION:  Education details: Discussed eval findings, rehab rationale and POC and  patient is in agreement  Person educated: Patient Education method: Explanation Education comprehension: verbalized understanding and needs further education  HOME EXERCISE PROGRAM: Access Code: [email or web address] URL: [email or web address] Date: 10/12/2022 Prepared by: Gustavus Bryant  Exercises - Bridge  - 2 x daily - 5 x weekly - 1 sets - 10 reps - Sit to Stand with Arms Crossed  - 2 x daily - 5 x weekly - 1 sets - 5 reps - Clamshell  - 2 x daily - 5 x weekly - 1 sets - 10 reps - Modified Thomas Stretch  - 2 x daily - 5 x weekly - 1 sets - 2 reps - 30s hold  ASSESSMENT:  CLINICAL IMPRESSION: Patient returns for first f/u session having missed several f/u appointments.  L hip pain is minimal.  Focus of today was B hip strengthening, stretching, aerobic work and functional training.  Assessed stair negotiation and recommended use of rails with step through pattern.  Patient is a 62 y.o. male who was seen today for physical therapy evaluation and treatment for L hip ROM and strength deficits following THA on 09/21/22 via anterior approach.  He has little to no pain to address.  He has returned to I ambulation w/o need of AD.  Primary focus of OPPT will be strengthening and regaining L hip extension ROM.    OBJECTIVE IMPAIRMENTS: Abnormal gait, decreased activity tolerance, decreased endurance, decreased knowledge of condition, decreased mobility, difficulty walking, decreased ROM, decreased strength, improper body mechanics, and obesity.   ACTIVITY LIMITATIONS: carrying, lifting, squatting, stairs, and locomotion level  PERSONAL FACTORS: Age, Fitness, Past/current experiences, and 1 comorbidity: concurrent R THA  are also affecting patient's functional outcome.   REHAB POTENTIAL: Good  CLINICAL DECISION MAKING: Stable/uncomplicated  EVALUATION COMPLEXITY: Low   GOALS: Goals reviewed with patient? No  SHORT TERM GOALS: Target date: 11/23/2022   Patient to demonstrate  independence in HEP  Baseline: [id number] Goal status: INITIAL  2.  Increase L hip extension to 5d Baseline: -5d extension Goal status: INITIAL  3.  Increase L hip strength to 4/5 Baseline:  MMT Right eval Left eval  Hip flexion  3+  Hip extension  3+  Hip abduction  3   Goal status: INITIAL  4.  Decrease 5x STS time to <15s arms crossed Baseline: 18s arms crossed Goal status: INITIAL   PLAN:  PT FREQUENCY: 1-2x/week  PT DURATION: 6 weeks  PLANNED INTERVENTIONS: Therapeutic exercises, Therapeutic activity, Neuromuscular re-education, Balance training, Gait training, Patient/Family education, Self Care, Joint mobilization, Stair training, Dry Needling, Electrical stimulation, Cryotherapy, Moist heat, Manual therapy, and Re-evaluation  PLAN FOR NEXT SESSION: HEP review and update, manual techniques as appropriate, aerobic tasks, ROM and flexibility activities, strengthening and PREs, TPDN, gait and balance training as needed     Hildred Laser, PT 11/06/2022, 11:29 AM   Check all possible CPT codes: 54098 - PT Re-evaluation, 97110- Therapeutic Exercise, [phone]- Neuro Re-education, [phone] - Gait Training, [phone] - Manual Therapy, 97530 - Therapeutic Activities, and [phone] - Self Care    Check all conditions that are expected to impact treatment: {Conditions expected to impact treatment:Musculoskeletal disorders and Contractures, spasticity or fracture relevant to requested treatment   If treatment provided at initial evaluation, no treatment charged due to lack of authorization.

## 2022-11-06 ENCOUNTER — Ambulatory Visit: Payer: Medicaid Other

## 2022-11-06 DIAGNOSIS — R2681 Unsteadiness on feet: Secondary | ICD-10-CM | POA: Diagnosis not present

## 2022-11-06 DIAGNOSIS — Z96642 Presence of left artificial hip joint: Secondary | ICD-10-CM

## 2022-11-06 DIAGNOSIS — R531 Weakness: Secondary | ICD-10-CM

## 2022-11-19 ENCOUNTER — Ambulatory Visit: Payer: Medicaid Other | Attending: Orthopedic Surgery

## 2022-11-19 ENCOUNTER — Telehealth: Payer: Self-pay

## 2022-11-19 DIAGNOSIS — Z96642 Presence of left artificial hip joint: Secondary | ICD-10-CM | POA: Insufficient documentation

## 2022-11-19 DIAGNOSIS — R2681 Unsteadiness on feet: Secondary | ICD-10-CM | POA: Insufficient documentation

## 2022-11-19 DIAGNOSIS — R531 Weakness: Secondary | ICD-10-CM | POA: Insufficient documentation

## 2022-11-19 NOTE — Telephone Encounter (Signed)
Spoke to patient regarding missed appointment, stated he was in too much pain to attend. Reminded of clinic attendance policy. Scheduled new appointment and added to waitlist for next week.  1st no-show  Berta Minor, [place] 11/19/22 3:07 PM

## 2022-11-24 NOTE — Therapy (Unsigned)
OUTPATIENT PHYSICAL THERAPY TREATMENT NOTE   Patient Name: Jonathan Carney MRN: [id number] DOB:Jan 24, 1961, 62 y.o., male Today's Date: 11/25/2022  END OF SESSION:  PT End of Session - 11/25/22 1040     Visit Number 3    Number of Visits 6    Date for PT Re-Evaluation 12/12/22    Authorization Type [employer] MCD    PT Start Time 1045    PT Stop Time 1125    PT Time Calculation (min) 40 min    Activity Tolerance Patient tolerated treatment well    Behavior During Therapy WFL for tasks assessed/performed               Past Medical History:  Diagnosis Date   Arthritis    Borderline high cholesterol    Depression    GERD (gastroesophageal reflux disease)    occais, no meds   Hypertension    Smoker    Past Surgical History:  Procedure Laterality Date   ANTERIOR CERVICAL DECOMP/DISCECTOMY FUSION N/A 04/08/2018   Procedure: Cervical Five-Six, Cervical Six-Seven Anterior cervical decompression/discectomy/fusion;  Surgeon: Tia Alert, MD;  Location: [facility] OR;  Service: Neurosurgery;  Laterality: N/A;  anterior   EYE SURGERY  to straighten it out   Right   I & D EXTREMITY Right 05/30/2020   Procedure: IRRIGATION AND DEBRIDEMENT OF RIGHT SMALL  FINGER;  Surgeon: Ernest Mallick, MD;  Location: [facility] OR;  Service: Orthopedics;  Laterality: Right;   INCISION AND DRAINAGE OF WOUND Right 06/17/2020   Procedure: Right small finger irrigation and debridement, application of integra;  Surgeon: Ernest Mallick, MD;  Location: [facility];  Service: Orthopedics;  Laterality: Right;  5TH   TOOTH EXTRACTION N/A 06/05/2022   Procedure: EXTRACTION TEETH NUMBER THREE, FOUR, FIVE, SIX, SEVEN, EIGHT, NINE, TEN, ELEVEN, FIFTEEN, SIXTEEN, TWENTY, TWENTY-ONE, TWENTY-TWO, TWENTY-THREE, TWENTY-SEVEN, THIRTY TWO, ALVEOLOPLASTY, AND REMOVAL OF BILATERAL LINGUAL TORI;  Surgeon: Ocie Doyne, DMD;  Location: [facility] OR;  Service: Oral Surgery;  Laterality: N/A;   TOTAL HIP ARTHROPLASTY Right  07/29/2022   Procedure: TOTAL HIP ARTHROPLASTY;  Surgeon: Joen Laura, MD;  Location: [facility];  Service: Orthopedics;  Laterality: Right;   TOTAL HIP ARTHROPLASTY Left 09/21/2022   Procedure: TOTAL HIP ARTHROPLASTY;  Surgeon: Joen Laura, MD;  Location: [facility];  Service: Orthopedics;  Laterality: Left;   Patient Active Problem List   Diagnosis Date Noted   Cellulitis of hand 05/29/2020   S/P cervical spinal fusion 04/08/2018    PCP: Fleet Contras, MD   REFERRING PROVIDER: Joen Laura, MD  REFERRING DIAG: s/p L THA  7/15  THERAPY DIAG:  Unsteadiness on feet  Decreased strength  H/O total hip arthroplasty, left  Rationale for Evaluation and Treatment: Rehabilitation  ONSET DATE: 09/21/2022  SUBJECTIVE:   SUBJECTIVE STATEMENT:  Reports intermittent symptoms in L hip up to 7/10 in intensity.  PERTINENT HISTORY: 62 yo male presents to therapy s/p L THA, posterior lateral approach on 08/22/2022 due to failure of conservative measures. Pt is L LE WBAT and no formal hip precautions. Pt PMH includes but is not limited to: cervical spine fusion, cellulitis of hand,HTN, tobacco abuse and R THA, posterior lateral approach on 07/29/2022.  PAIN:  Are you having pain? Yes: NPRS scale: 7/10 Pain location: L hip Pain description: ache Aggravating factors: stepping Relieving factors: rest  PRECAUTIONS: Anterior hip  RED FLAGS: None   WEIGHT BEARING RESTRICTIONS: No  FALLS:  Has patient fallen in last 6  months? No  OCCUPATION: not working  PLOF: Independent  PATIENT GOALS: To return to fishing  NEXT MD VISIT: 3-4 weeks  OBJECTIVE:   DIAGNOSTIC FINDINGS:  Narrative & Impression  CLINICAL DATA:  Left hip arthroplasty.   EXAM: DG HIP (WITH OR WITHOUT PELVIS) 2-3V LEFT   COMPARISON:  09/21/2022   FINDINGS: Two views of the left hip were obtained. Left total hip arthroplasty is located without a periprosthetic fracture. Right hip  arthroplasty appears located. Visualized pelvic bony ring is intact.   IMPRESSION: Left hip arthroplasty without complicating features.     Electronically Signed   By: Richarda Overlie M.D.   On: 09/21/2022 12:15      PATIENT SURVEYS:  FOTO 85(79 predicted)  MUSCLE LENGTH: Hamstrings: Right 90 deg; Left 90 deg  POSTURE:  slightly flexed at hips   LOWER EXTREMITY ROM:  Active ROM Right eval Left eval  Hip flexion  100d  Hip extension  -5d  Hip abduction  30d  Hip adduction    Hip internal rotation    Hip external rotation    Knee flexion    Knee extension    Ankle dorsiflexion    Ankle plantarflexion    Ankle inversion    Ankle eversion     (Blank rows = not tested)  LOWER EXTREMITY MMT:  MMT Right eval Left eval  Hip flexion  3+  Hip extension  3+  Hip abduction  3  Hip adduction    Hip internal rotation    Hip external rotation    Knee flexion    Knee extension    Ankle dorsiflexion    Ankle plantarflexion    Ankle inversion    Ankle eversion     (Blank rows = not tested)  LOWER EXTREMITY SPECIAL TESTS:  deferred  FUNCTIONAL TESTS:  5 times sit to stand: 18s arms crossed  GAIT: Distance walked: 18ftx2 Assistive device utilized: None Level of assistance: Complete Independence Comments: decreased rotational motion at pelvis   TODAY'S TREATMENT:      [facility] Adult PT Treatment:                                                DATE: 11/25/22 Therapeutic Exercise: Nustep L3 8 min L hip flexor stretch 30s x2 Supine hip fallouts BluTB 15x B, 15/15 unilaterally Bridge against BluTB 15x S/L clams 15x B BluTB Bridge with ball 15x Runners step 6 in 10/10 Lateral stepping 6 in 10/10 POE  2 min    [facility] Adult PT Treatment:                                                DATE: 11/19/22 Therapeutic Exercise: Nustep L2 8 min L hip flexor stretch 30s x2 Supine hip fallouts GTB 15x B, 15/15 unilaterally Bridge against GTB 15x S/L clams 15x B GTB Bridge  with ball 15x SLR supine march 2# 15/15 FAQs 2# 15/15 Standing heel raises 15x Standing hamstring curls 2# 15/15 16 steps with B rail assist and step to/through pattern    [facility] Adult PT Treatment:  DATE: 11/06/22 Therapeutic Exercise: Nustep L2 8 min L hip flexor stretch 30s x2 Supine hip fallouts GTB 15x B, 15/15 unilaterally Bridge against GTB 15x S/L clams 15x B GTB Bridge with ball 15x SLR supine march 2# 15/15 FAQs 2# 15/15 Standing heel raises 15x Standing hamstring curls 2# 15/15 16 steps with B rail assist and step to/through pattern                                                                                                                       DATE: 10/12/22 Eval and HEP    PATIENT EDUCATION:  Education details: Discussed eval findings, rehab rationale and POC and patient is in agreement  Person educated: Patient Education method: Explanation Education comprehension: verbalized understanding and needs further education  HOME EXERCISE PROGRAM: Access Code: [email or web address] URL: [email or web address] Date: 10/12/2022 Prepared by: Gustavus Bryant  Exercises - Bridge  - 2 x daily - 5 x weekly - 1 sets - 10 reps - Sit to Stand with Arms Crossed  - 2 x daily - 5 x weekly - 1 sets - 5 reps - Clamshell  - 2 x daily - 5 x weekly - 1 sets - 10 reps - Modified Thomas Stretch  - 2 x daily - 5 x weekly - 1 sets - 2 reps - 30s hold  ASSESSMENT:  CLINICAL IMPRESSION: Increased resistance on exercises as noted.  Focus on L hip extension ROM.  Frequent VC's needed to perform exercises with proper form. Added POE to promote hip/lumbar mobility.  Incorporated CKC exercises for strength and proprioception.  Patient is a 62 y.o. male who was seen today for physical therapy evaluation and treatment for L hip ROM and strength deficits following THA on 09/21/22 via anterior approach.  He has little to no pain to address.  He has  returned to I ambulation w/o need of AD.  Primary focus of OPPT will be strengthening and regaining L hip extension ROM.    OBJECTIVE IMPAIRMENTS: Abnormal gait, decreased activity tolerance, decreased endurance, decreased knowledge of condition, decreased mobility, difficulty walking, decreased ROM, decreased strength, improper body mechanics, and obesity.   ACTIVITY LIMITATIONS: carrying, lifting, squatting, stairs, and locomotion level  PERSONAL FACTORS: Age, Fitness, Past/current experiences, and 1 comorbidity: concurrent R THA  are also affecting patient's functional outcome.   REHAB POTENTIAL: Good  CLINICAL DECISION MAKING: Stable/uncomplicated  EVALUATION COMPLEXITY: Low   GOALS: Goals reviewed with patient? No  SHORT TERM GOALS: Target date: 12/23/2022   Patient to demonstrate independence in HEP  Baseline: [id number] Goal status: INITIAL  2.  Increase L hip extension to 5d Baseline: -5d extension Goal status: INITIAL  3.  Increase L hip strength to 4/5 Baseline:  MMT Right eval Left eval  Hip flexion  3+  Hip extension  3+  Hip abduction  3   Goal status: INITIAL  4.  Decrease 5x STS time to <15s arms crossed Baseline: 18s arms crossed Goal status: INITIAL   PLAN:  PT  FREQUENCY: 1-2x/week  PT DURATION: 6 weeks  PLANNED INTERVENTIONS: Therapeutic exercises, Therapeutic activity, Neuromuscular re-education, Balance training, Gait training, Patient/Family education, Self Care, Joint mobilization, Stair training, Dry Needling, Electrical stimulation, Cryotherapy, Moist heat, Manual therapy, and Re-evaluation  PLAN FOR NEXT SESSION: HEP review and update, manual techniques as appropriate, aerobic tasks, ROM and flexibility activities, strengthening and PREs, TPDN, gait and balance training as needed     Hildred Laser, PT 11/25/2022, 11:26 AM   Check all possible CPT codes: 81191 - PT Re-evaluation, 97110- Therapeutic Exercise, [phone]- Neuro Re-education,  [phone] - Gait Training, [phone] - Manual Therapy, [phone] - Therapeutic Activities, and [phone] - Self Care    Check all conditions that are expected to impact treatment: {Conditions expected to impact treatment:Musculoskeletal disorders and Contractures, spasticity or fracture relevant to requested treatment   If treatment provided at initial evaluation, no treatment charged due to lack of authorization.

## 2022-11-25 ENCOUNTER — Ambulatory Visit: Payer: Medicaid Other

## 2022-11-25 DIAGNOSIS — R531 Weakness: Secondary | ICD-10-CM

## 2022-11-25 DIAGNOSIS — Z96642 Presence of left artificial hip joint: Secondary | ICD-10-CM

## 2022-11-25 DIAGNOSIS — R2681 Unsteadiness on feet: Secondary | ICD-10-CM

## 2022-12-01 NOTE — Therapy (Deleted)
OUTPATIENT PHYSICAL THERAPY TREATMENT NOTE   Patient Name: Jonathan Carney MRN: [id number] DOB:07-06-1960, 62 y.o., male Today's Date: 12/01/2022  END OF SESSION:      Past Medical History:  Diagnosis Date   Arthritis    Borderline high cholesterol    Depression    GERD (gastroesophageal reflux disease)    occais, no meds   Hypertension    Smoker    Past Surgical History:  Procedure Laterality Date   ANTERIOR CERVICAL DECOMP/DISCECTOMY FUSION N/A 04/08/2018   Procedure: Cervical Five-Six, Cervical Six-Seven Anterior cervical decompression/discectomy/fusion;  Surgeon: Tia Alert, MD;  Location: [facility] OR;  Service: Neurosurgery;  Laterality: N/A;  anterior   EYE SURGERY  to straighten it out   Right   I & D EXTREMITY Right 05/30/2020   Procedure: IRRIGATION AND DEBRIDEMENT OF RIGHT SMALL  FINGER;  Surgeon: Ernest Mallick, MD;  Location: [facility] OR;  Service: Orthopedics;  Laterality: Right;   INCISION AND DRAINAGE OF WOUND Right 06/17/2020   Procedure: Right small finger irrigation and debridement, application of integra;  Surgeon: Ernest Mallick, MD;  Location: [facility];  Service: Orthopedics;  Laterality: Right;  5TH   TOOTH EXTRACTION N/A 06/05/2022   Procedure: EXTRACTION TEETH NUMBER THREE, FOUR, FIVE, SIX, SEVEN, EIGHT, NINE, TEN, ELEVEN, FIFTEEN, SIXTEEN, TWENTY, TWENTY-ONE, TWENTY-TWO, TWENTY-THREE, TWENTY-SEVEN, THIRTY TWO, ALVEOLOPLASTY, AND REMOVAL OF BILATERAL LINGUAL TORI;  Surgeon: Ocie Doyne, DMD;  Location: [facility] OR;  Service: Oral Surgery;  Laterality: N/A;   TOTAL HIP ARTHROPLASTY Right 07/29/2022   Procedure: TOTAL HIP ARTHROPLASTY;  Surgeon: Joen Laura, MD;  Location: [facility];  Service: Orthopedics;  Laterality: Right;   TOTAL HIP ARTHROPLASTY Left 09/21/2022   Procedure: TOTAL HIP ARTHROPLASTY;  Surgeon: Joen Laura, MD;  Location: [facility];  Service: Orthopedics;  Laterality: Left;   Patient Active Problem List    Diagnosis Date Noted   Cellulitis of hand 05/29/2020   S/P cervical spinal fusion 04/08/2018    PCP: Fleet Contras, MD   REFERRING PROVIDER: Joen Laura, MD  REFERRING DIAG: s/p L THA  7/15  THERAPY DIAG:  No diagnosis found.  Rationale for Evaluation and Treatment: Rehabilitation  ONSET DATE: 09/21/2022  SUBJECTIVE:   SUBJECTIVE STATEMENT:  Reports intermittent symptoms in L hip up to 7/10 in intensity.  PERTINENT HISTORY: 62 yo male presents to therapy s/p L THA, posterior lateral approach on 08/22/2022 due to failure of conservative measures. Pt is L LE WBAT and no formal hip precautions. Pt PMH includes but is not limited to: cervical spine fusion, cellulitis of hand,HTN, tobacco abuse and R THA, posterior lateral approach on 07/29/2022.  PAIN:  Are you having pain? Yes: NPRS scale: 7/10 Pain location: L hip Pain description: ache Aggravating factors: stepping Relieving factors: rest  PRECAUTIONS: Anterior hip  RED FLAGS: None   WEIGHT BEARING RESTRICTIONS: No  FALLS:  Has patient fallen in last 6 months? No  OCCUPATION: not working  PLOF: Independent  PATIENT GOALS: To return to fishing  NEXT MD VISIT: 3-4 weeks  OBJECTIVE:   DIAGNOSTIC FINDINGS:  Narrative & Impression  CLINICAL DATA:  Left hip arthroplasty.   EXAM: DG HIP (WITH OR WITHOUT PELVIS) 2-3V LEFT   COMPARISON:  09/21/2022   FINDINGS: Two views of the left hip were obtained. Left total hip arthroplasty is located without a periprosthetic fracture. Right hip arthroplasty appears located. Visualized pelvic bony ring is intact.   IMPRESSION: Left hip arthroplasty without complicating features.  Electronically Signed   By: Richarda Overlie M.D.   On: 09/21/2022 12:15      PATIENT SURVEYS:  FOTO 85(79 predicted)  MUSCLE LENGTH: Hamstrings: Right 90 deg; Left 90 deg  POSTURE:  slightly flexed at hips   LOWER EXTREMITY ROM:  Active ROM Right eval Left eval  Hip  flexion  100d  Hip extension  -5d  Hip abduction  30d  Hip adduction    Hip internal rotation    Hip external rotation    Knee flexion    Knee extension    Ankle dorsiflexion    Ankle plantarflexion    Ankle inversion    Ankle eversion     (Blank rows = not tested)  LOWER EXTREMITY MMT:  MMT Right eval Left eval  Hip flexion  3+  Hip extension  3+  Hip abduction  3  Hip adduction    Hip internal rotation    Hip external rotation    Knee flexion    Knee extension    Ankle dorsiflexion    Ankle plantarflexion    Ankle inversion    Ankle eversion     (Blank rows = not tested)  LOWER EXTREMITY SPECIAL TESTS:  deferred  FUNCTIONAL TESTS:  5 times sit to stand: 18s arms crossed  GAIT: Distance walked: 67ftx2 Assistive device utilized: None Level of assistance: Complete Independence Comments: decreased rotational motion at pelvis   TODAY'S TREATMENT:      [facility] Adult PT Treatment:                                                DATE: 11/25/22 Therapeutic Exercise: Nustep L3 8 min L hip flexor stretch 30s x2 Supine hip fallouts BluTB 15x B, 15/15 unilaterally Bridge against BluTB 15x S/L clams 15x B BluTB Bridge with ball 15x Runners step 6 in 10/10 Lateral stepping 6 in 10/10 POE  2 min    [facility] Adult PT Treatment:                                                DATE: 11/19/22 Therapeutic Exercise: Nustep L2 8 min L hip flexor stretch 30s x2 Supine hip fallouts GTB 15x B, 15/15 unilaterally Bridge against GTB 15x S/L clams 15x B GTB Bridge with ball 15x SLR supine march 2# 15/15 FAQs 2# 15/15 Standing heel raises 15x Standing hamstring curls 2# 15/15 16 steps with B rail assist and step to/through pattern    [facility] Adult PT Treatment:                                                DATE: 11/06/22 Therapeutic Exercise: Nustep L2 8 min L hip flexor stretch 30s x2 Supine hip fallouts GTB 15x B, 15/15 unilaterally Bridge against GTB 15x S/L clams 15x B  GTB Bridge with ball 15x SLR supine march 2# 15/15 FAQs 2# 15/15 Standing heel raises 15x Standing hamstring curls 2# 15/15 16 steps with B rail assist and step to/through pattern  DATE: 10/12/22 Eval and HEP    PATIENT EDUCATION:  Education details: Discussed eval findings, rehab rationale and POC and patient is in agreement  Person educated: Patient Education method: Explanation Education comprehension: verbalized understanding and needs further education  HOME EXERCISE PROGRAM: Access Code: [email or web address] URL: [email or web address] Date: 10/12/2022 Prepared by: Gustavus Bryant  Exercises - Bridge  - 2 x daily - 5 x weekly - 1 sets - 10 reps - Sit to Stand with Arms Crossed  - 2 x daily - 5 x weekly - 1 sets - 5 reps - Clamshell  - 2 x daily - 5 x weekly - 1 sets - 10 reps - Modified Thomas Stretch  - 2 x daily - 5 x weekly - 1 sets - 2 reps - 30s hold  ASSESSMENT:  CLINICAL IMPRESSION: Increased resistance on exercises as noted.  Focus on L hip extension ROM.  Frequent VC's needed to perform exercises with proper form. Added POE to promote hip/lumbar mobility.  Incorporated CKC exercises for strength and proprioception.  Patient is a 62 y.o. male who was seen today for physical therapy evaluation and treatment for L hip ROM and strength deficits following THA on 09/21/22 via anterior approach.  He has little to no pain to address.  He has returned to I ambulation w/o need of AD.  Primary focus of OPPT will be strengthening and regaining L hip extension ROM.    OBJECTIVE IMPAIRMENTS: Abnormal gait, decreased activity tolerance, decreased endurance, decreased knowledge of condition, decreased mobility, difficulty walking, decreased ROM, decreased strength, improper body mechanics, and obesity.   ACTIVITY LIMITATIONS: carrying, lifting, squatting, stairs, and  locomotion level  PERSONAL FACTORS: Age, Fitness, Past/current experiences, and 1 comorbidity: concurrent R THA  are also affecting patient's functional outcome.   REHAB POTENTIAL: Good  CLINICAL DECISION MAKING: Stable/uncomplicated  EVALUATION COMPLEXITY: Low   GOALS: Goals reviewed with patient? No  SHORT TERM GOALS: Target date: 12/23/2022   Patient to demonstrate independence in HEP  Baseline: [id number] Goal status: INITIAL  2.  Increase L hip extension to 5d Baseline: -5d extension Goal status: INITIAL  3.  Increase L hip strength to 4/5 Baseline:  MMT Right eval Left eval  Hip flexion  3+  Hip extension  3+  Hip abduction  3   Goal status: INITIAL  4.  Decrease 5x STS time to <15s arms crossed Baseline: 18s arms crossed Goal status: INITIAL   PLAN:  PT FREQUENCY: 1-2x/week  PT DURATION: 6 weeks  PLANNED INTERVENTIONS: Therapeutic exercises, Therapeutic activity, Neuromuscular re-education, Balance training, Gait training, Patient/Family education, Self Care, Joint mobilization, Stair training, Dry Needling, Electrical stimulation, Cryotherapy, Moist heat, Manual therapy, and Re-evaluation  PLAN FOR NEXT SESSION: HEP review and update, manual techniques as appropriate, aerobic tasks, ROM and flexibility activities, strengthening and PREs, TPDN, gait and balance training as needed     Hildred Laser, PT 12/01/2022, 5:17 PM   Check all possible CPT codes: 54098 - PT Re-evaluation, 97110- Therapeutic Exercise, [phone]- Neuro Re-education, [phone] - Gait Training, [phone] - Manual Therapy, 97530 - Therapeutic Activities, and 97535 - Self Care    Check all conditions that are expected to impact treatment: {Conditions expected to impact treatment:Musculoskeletal disorders and Contractures, spasticity or fracture relevant to requested treatment   If treatment provided at initial evaluation, no treatment charged due to lack of authorization.

## 2022-12-03 ENCOUNTER — Ambulatory Visit: Payer: Medicaid Other

## 2022-12-09 ENCOUNTER — Ambulatory Visit: Payer: Medicaid Other

## 2022-12-14 NOTE — Therapy (Unsigned)
OUTPATIENT PHYSICAL THERAPY TREATMENT NOTE   Patient Name: Jonathan Carney MRN: [id number] DOB:1960-07-18, 62 y.o., male Today's Date: 12/16/2022  END OF SESSION:  PT End of Session - 12/16/22 1315     Visit Number 4    Number of Visits 6    Date for PT Re-Evaluation 12/12/22    Authorization Type [employer] MCD    PT Start Time 1315    PT Stop Time 1355    PT Time Calculation (min) 40 min    Activity Tolerance Patient tolerated treatment well    Behavior During Therapy WFL for tasks assessed/performed                Past Medical History:  Diagnosis Date   Arthritis    Borderline high cholesterol    Depression    GERD (gastroesophageal reflux disease)    occais, no meds   Hypertension    Smoker    Past Surgical History:  Procedure Laterality Date   ANTERIOR CERVICAL DECOMP/DISCECTOMY FUSION N/A 04/08/2018   Procedure: Cervical Five-Six, Cervical Six-Seven Anterior cervical decompression/discectomy/fusion;  Surgeon: Tia Alert, MD;  Location: [facility] OR;  Service: Neurosurgery;  Laterality: N/A;  anterior   EYE SURGERY  to straighten it out   Right   I & D EXTREMITY Right 05/30/2020   Procedure: IRRIGATION AND DEBRIDEMENT OF RIGHT SMALL  FINGER;  Surgeon: Ernest Mallick, MD;  Location: [facility] OR;  Service: Orthopedics;  Laterality: Right;   INCISION AND DRAINAGE OF WOUND Right 06/17/2020   Procedure: Right small finger irrigation and debridement, application of integra;  Surgeon: Ernest Mallick, MD;  Location: [facility];  Service: Orthopedics;  Laterality: Right;  5TH   TOOTH EXTRACTION N/A 06/05/2022   Procedure: EXTRACTION TEETH NUMBER THREE, FOUR, FIVE, SIX, SEVEN, EIGHT, NINE, TEN, ELEVEN, FIFTEEN, SIXTEEN, TWENTY, TWENTY-ONE, TWENTY-TWO, TWENTY-THREE, TWENTY-SEVEN, THIRTY TWO, ALVEOLOPLASTY, AND REMOVAL OF BILATERAL LINGUAL TORI;  Surgeon: Ocie Doyne, DMD;  Location: [facility] OR;  Service: Oral Surgery;  Laterality: N/A;   TOTAL HIP ARTHROPLASTY Right  07/29/2022   Procedure: TOTAL HIP ARTHROPLASTY;  Surgeon: Joen Laura, MD;  Location: [facility] ORS;  Service: Orthopedics;  Laterality: Right;   TOTAL HIP ARTHROPLASTY Left 09/21/2022   Procedure: TOTAL HIP ARTHROPLASTY;  Surgeon: Joen Laura, MD;  Location: [facility] ORS;  Service: Orthopedics;  Laterality: Left;   Patient Active Problem List   Diagnosis Date Noted   Cellulitis of hand 05/29/2020   S/P cervical spinal fusion 04/08/2018    PCP: Fleet Contras, MD   REFERRING PROVIDER: Joen Laura, MD  REFERRING DIAG: s/p L THA  7/15  THERAPY DIAG:  Unsteadiness on feet  Decreased strength  H/O total hip arthroplasty, left  Rationale for Evaluation and Treatment: Rehabilitation  ONSET DATE: 09/21/2022  SUBJECTIVE:   SUBJECTIVE STATEMENT:  Reports continued L hip pain deep vs superficial, 8/10 in intensity.  Has been partially compliant with homw stretching but has been active with walking  PERTINENT HISTORY: 62 yo male presents to therapy s/p L THA, posterior lateral approach on 08/22/2022 due to failure of conservative measures. Pt is L LE WBAT and no formal hip precautions. Pt PMH includes but is not limited to: cervical spine fusion, cellulitis of hand,HTN, tobacco abuse and R THA, posterior lateral approach on 07/29/2022.  PAIN:  Are you having pain? Yes: NPRS scale: 8/10 Pain location: L hip Pain description: ache Aggravating factors: stepping Relieving factors: rest  PRECAUTIONS: Anterior hip  RED FLAGS: None  WEIGHT BEARING RESTRICTIONS: No  FALLS:  Has patient fallen in last 6 months? No  OCCUPATION: not working  PLOF: Independent  PATIENT GOALS: To return to fishing  NEXT MD VISIT: 3-4 weeks  OBJECTIVE:   DIAGNOSTIC FINDINGS:  Narrative & Impression  CLINICAL DATA:  Left hip arthroplasty.   EXAM: DG HIP (WITH OR WITHOUT PELVIS) 2-3V LEFT   COMPARISON:  09/21/2022   FINDINGS: Two views of the left hip were obtained. Left total  hip arthroplasty is located without a periprosthetic fracture. Right hip arthroplasty appears located. Visualized pelvic bony ring is intact.   IMPRESSION: Left hip arthroplasty without complicating features.     Electronically Signed   By: Richarda Overlie M.D.   On: 09/21/2022 12:15      PATIENT SURVEYS:  FOTO 85(79 predicted)  MUSCLE LENGTH: Hamstrings: Right 90 deg; Left 90 deg  POSTURE:  slightly flexed at hips   LOWER EXTREMITY ROM:  Active ROM Right eval Left eval L 12/16/22  Hip flexion  100d 110d  Hip extension  -5d -5d  Hip abduction  30d 30d  Hip adduction     Hip internal rotation     Hip external rotation     Knee flexion     Knee extension     Ankle dorsiflexion     Ankle plantarflexion     Ankle inversion     Ankle eversion      (Blank rows = not tested)  LOWER EXTREMITY MMT:  MMT Right eval Left eval L 12/16/22  Hip flexion  3+ 4-  Hip extension  3+ 4-  Hip abduction  3 4-(3 gluteus medius)  Hip adduction     Hip internal rotation     Hip external rotation     Knee flexion     Knee extension     Ankle dorsiflexion     Ankle plantarflexion     Ankle inversion     Ankle eversion      (Blank rows = not tested)  LOWER EXTREMITY SPECIAL TESTS:  deferred  FUNCTIONAL TESTS:  5 times sit to stand: 18s arms crossed 12/16/22 13s  GAIT: Distance walked: 28ftx2 Assistive device utilized: None Level of assistance: Complete Independence Comments: decreased rotational motion at pelvis   TODAY'S TREATMENT:      [facility] Adult PT Treatment:                                                DATE: 12/16/22 Therapeutic Exercise: Nustep L4 8 min PKB 60s hold x2 with strap L hip flexor stretch 30s x2 Supine hip fallouts BluTB 15x B S/L clams 15x B BluTB POE  2 min Re-assessment  [facility] Adult PT Treatment:                                                DATE: 12/09/22 Therapeutic Exercise: Nustep L3 8 min L hip flexor stretch 30s x2 Supine hip fallouts  BluTB 15x B, 15/15 unilaterally Bridge against BluTB 15x S/L clams 15x B BluTB Bridge with ball 15x Runners step 6 in 10/10 Lateral stepping 6 in 10/10 POE  2 min   [facility] Adult PT Treatment:  DATE: 11/25/22 Therapeutic Exercise: Nustep L3 8 min L hip flexor stretch 30s x2 Supine hip fallouts BluTB 15x B, 15/15 unilaterally Bridge against BluTB 15x S/L clams 15x B BluTB Bridge with ball 15x Runners step 6 in 10/10 Lateral stepping 6 in 10/10 POE  2 min    PATIENT EDUCATION:  Education details: Discussed eval findings, rehab rationale and POC and patient is in agreement  Person educated: Patient Education method: Explanation Education comprehension: verbalized understanding and needs further education  HOME EXERCISE PROGRAM: Access Code: [email or web address] URL: [email or web address] Date: 10/12/2022 Prepared by: Gustavus Bryant  Exercises - Bridge  - 2 x daily - 5 x weekly - 1 sets - 10 reps - Sit to Stand with Arms Crossed  - 2 x daily - 5 x weekly - 1 sets - 5 reps - Clamshell  - 2 x daily - 5 x weekly - 1 sets - 10 reps - Modified Thomas Stretch  - 2 x daily - 5 x weekly - 1 sets - 2 reps - 30s hold  ASSESSMENT:  CLINICAL IMPRESSION: Focus of today was re-assessment of progress.  Patient demonstrates strength gains as well as decreased 5x STS time.  L hip extension continues to remain restricted. Reviewed and updated HEP to focus on hip flexor stretching to promote L hip extension   Patient is a 62 y.o. male who was seen today for physical therapy evaluation and treatment for L hip ROM and strength deficits following THA on 09/21/22 via anterior approach.  He has little to no pain to address.  He has returned to I ambulation w/o need of AD.  Primary focus of OPPT will be strengthening and regaining L hip extension ROM.    OBJECTIVE IMPAIRMENTS: Abnormal gait, decreased activity tolerance, decreased endurance, decreased  knowledge of condition, decreased mobility, difficulty walking, decreased ROM, decreased strength, improper body mechanics, and obesity.   ACTIVITY LIMITATIONS: carrying, lifting, squatting, stairs, and locomotion level  PERSONAL FACTORS: Age, Fitness, Past/current experiences, and 1 comorbidity: concurrent R THA  are also affecting patient's functional outcome.   REHAB POTENTIAL: Good  CLINICAL DECISION MAKING: Stable/uncomplicated  EVALUATION COMPLEXITY: Low   GOALS: Goals reviewed with patient? No  SHORT TERM GOALS=LONG TERM GOALS: Target date: 02/15/2023   Patient to demonstrate independence in HEP  Baseline: [id number] Goal status: Ongoing  2.  Increase L hip extension to 5d Baseline: -5d extension Goal status: Ongoing  3.  Increase L hip strength to 4/5 Baseline:  MMT Right eval Left eval L 12/16/22  Hip flexion  3+ 4-  Hip extension  3+ 4-  Hip abduction  3 4-(3 gluteus medius)   Goal status: Ongoing  4.  Decrease 5x STS time to <15s arms crossed Baseline: 18s arms crossed; 12/16/22 13s  Goal status: MET   PLAN:  PT FREQUENCY: 1x/week  PT DURATION: 2 weeks  PLANNED INTERVENTIONS: Therapeutic exercises, Therapeutic activity, Neuromuscular re-education, Balance training, Gait training, Patient/Family education, Self Care, Joint mobilization, Stair training, Dry Needling, Electrical stimulation, Cryotherapy, Moist heat, Manual therapy, and Re-evaluation  PLAN FOR NEXT SESSION: HEP review and update, manual techniques as appropriate, aerobic tasks, ROM and flexibility activities, strengthening and PREs, TPDN, gait and balance training as needed     Hildred Laser, PT 12/16/2022, 2:27 PM   Check all possible CPT codes: 54098 - PT Re-evaluation, 97110- Therapeutic Exercise, [phone]- Neuro Re-education, [phone] - Gait Training, 97140 - Manual Therapy, 97530 - Therapeutic Activities, and 97535 - Self Care    Check all  conditions that are expected to impact  treatment: {Conditions expected to impact treatment:Musculoskeletal disorders and Contractures, spasticity or fracture relevant to requested treatment   If treatment provided at initial evaluation, no treatment charged due to lack of authorization.

## 2022-12-16 ENCOUNTER — Ambulatory Visit: Payer: Medicaid Other | Attending: Orthopedic Surgery

## 2022-12-16 DIAGNOSIS — R531 Weakness: Secondary | ICD-10-CM | POA: Insufficient documentation

## 2022-12-16 DIAGNOSIS — R2681 Unsteadiness on feet: Secondary | ICD-10-CM | POA: Insufficient documentation

## 2022-12-16 DIAGNOSIS — Z96642 Presence of left artificial hip joint: Secondary | ICD-10-CM | POA: Insufficient documentation

## 2022-12-31 ENCOUNTER — Ambulatory Visit: Payer: Medicaid Other

## 2022-12-31 DIAGNOSIS — Z96642 Presence of left artificial hip joint: Secondary | ICD-10-CM

## 2022-12-31 DIAGNOSIS — R531 Weakness: Secondary | ICD-10-CM

## 2022-12-31 DIAGNOSIS — R2681 Unsteadiness on feet: Secondary | ICD-10-CM | POA: Diagnosis not present

## 2022-12-31 NOTE — Therapy (Addendum)
OUTPATIENT PHYSICAL THERAPY TREATMENT NOTE/DC SUMMARY   Patient Name: Jonathan Carney MRN: [id number] DOB:[birth year], 62 y.o., male Today's Date: 12/31/2022 PHYSICAL THERAPY DISCHARGE SUMMARY  Visits from Start of Care: 5  Current functional level related to goals / functional outcomes: UTA   Remaining deficits: UTA   Education / Equipment: HEP   Patient agrees to discharge. Patient goals were partially met. Patient is being discharged due to not returning since the last visit.  END OF SESSION:  PT End of Session - 12/31/22 1108     Visit Number 5    Number of Visits 6    Date for PT Re-Evaluation 02/15/23    Authorization Type [employer] MCD    PT Start Time 1125    PT Stop Time 1203    PT Time Calculation (min) 38 min    Activity Tolerance Patient tolerated treatment well    Behavior During Therapy WFL for tasks assessed/performed             Past Medical History:  Diagnosis Date   Arthritis    Borderline high cholesterol    Depression    GERD (gastroesophageal reflux disease)    occais, no meds   Hypertension    Smoker    Past Surgical History:  Procedure Laterality Date   ANTERIOR CERVICAL DECOMP/DISCECTOMY FUSION N/A 04/08/2018   Procedure: Cervical Five-Six, Cervical Six-Seven Anterior cervical decompression/discectomy/fusion;  Surgeon: Tia Alert, MD;  Location: [facility] OR;  Service: Neurosurgery;  Laterality: N/A;  anterior   EYE SURGERY  to straighten it out   Right   I & D EXTREMITY Right 05/30/2020   Procedure: IRRIGATION AND DEBRIDEMENT OF RIGHT SMALL  FINGER;  Surgeon: Ernest Mallick, MD;  Location: [facility] OR;  Service: Orthopedics;  Laterality: Right;   INCISION AND DRAINAGE OF WOUND Right 06/17/2020   Procedure: Right small finger irrigation and debridement, application of integra;  Surgeon: Ernest Mallick, MD;  Location: [facility];  Service: Orthopedics;  Laterality: Right;  5TH   TOOTH EXTRACTION N/A 06/05/2022   Procedure:  EXTRACTION TEETH NUMBER THREE, FOUR, FIVE, SIX, SEVEN, EIGHT, NINE, TEN, ELEVEN, FIFTEEN, SIXTEEN, TWENTY, TWENTY-ONE, TWENTY-TWO, TWENTY-THREE, TWENTY-SEVEN, THIRTY TWO, ALVEOLOPLASTY, AND REMOVAL OF BILATERAL LINGUAL TORI;  Surgeon: Ocie Doyne, DMD;  Location: [facility] OR;  Service: Oral Surgery;  Laterality: N/A;   TOTAL HIP ARTHROPLASTY Right 07/29/2022   Procedure: TOTAL HIP ARTHROPLASTY;  Surgeon: Joen Laura, MD;  Location: [facility];  Service: Orthopedics;  Laterality: Right;   TOTAL HIP ARTHROPLASTY Left 09/21/2022   Procedure: TOTAL HIP ARTHROPLASTY;  Surgeon: Joen Laura, MD;  Location: [facility];  Service: Orthopedics;  Laterality: Left;   Patient Active Problem List   Diagnosis Date Noted   Cellulitis of hand 05/29/2020   S/P cervical spinal fusion 04/08/2018    PCP: Fleet Contras, MD   REFERRING PROVIDER: Joen Laura, MD  REFERRING DIAG: s/p L THA  7/15  THERAPY DIAG:  Unsteadiness on feet  Decreased strength  H/O total hip arthroplasty, left  Rationale for Evaluation and Treatment: Rehabilitation  ONSET DATE: 09/21/2022  SUBJECTIVE:   SUBJECTIVE STATEMENT:  Patient reports continued improvement in his Lt hip pain, rating 3/10 today.   PERTINENT HISTORY: 62 yo male presents to therapy s/p L THA, posterior lateral approach on 08/22/2022 due to failure of conservative measures. Pt is L LE WBAT and no formal hip precautions. Pt PMH includes but is not limited to: cervical spine fusion, cellulitis of hand,HTN,  tobacco abuse and R THA, posterior lateral approach on 07/29/2022.  PAIN:  Are you having pain? Yes: NPRS scale: 3/10 Pain location: L hip Pain description: ache Aggravating factors: stepping Relieving factors: rest  PRECAUTIONS: Anterior hip  RED FLAGS: None   WEIGHT BEARING RESTRICTIONS: No  FALLS:  Has patient fallen in last 6 months? No  OCCUPATION: not working  PLOF: Independent  PATIENT GOALS: To return to fishing  NEXT MD  VISIT: 3-4 weeks  OBJECTIVE:   DIAGNOSTIC FINDINGS:  Narrative & Impression  CLINICAL DATA:  Left hip arthroplasty.   EXAM: DG HIP (WITH OR WITHOUT PELVIS) 2-3V LEFT   COMPARISON:  09/21/2022   FINDINGS: Two views of the left hip were obtained. Left total hip arthroplasty is located without a periprosthetic fracture. Right hip arthroplasty appears located. Visualized pelvic bony ring is intact.   IMPRESSION: Left hip arthroplasty without complicating features.     Electronically Signed   By: Richarda Overlie M.D.   On: 09/21/2022 12:15      PATIENT SURVEYS:  FOTO 85(79 predicted)  MUSCLE LENGTH: Hamstrings: Right 90 deg; Left 90 deg  POSTURE:  slightly flexed at hips   LOWER EXTREMITY ROM:  Active ROM Right eval Left eval L 12/16/22  Hip flexion  100d 110d  Hip extension  -5d -5d  Hip abduction  30d 30d  Hip adduction     Hip internal rotation     Hip external rotation     Knee flexion     Knee extension     Ankle dorsiflexion     Ankle plantarflexion     Ankle inversion     Ankle eversion      (Blank rows = not tested)  LOWER EXTREMITY MMT:  MMT Right eval Left eval L 12/16/22  Hip flexion  3+ 4-  Hip extension  3+ 4-  Hip abduction  3 4-(3 gluteus medius)  Hip adduction     Hip internal rotation     Hip external rotation     Knee flexion     Knee extension     Ankle dorsiflexion     Ankle plantarflexion     Ankle inversion     Ankle eversion      (Blank rows = not tested)  LOWER EXTREMITY SPECIAL TESTS:  deferred  FUNCTIONAL TESTS:  5 times sit to stand: 18s arms crossed 12/16/22 13s  GAIT: Distance walked: 46ftx2 Assistive device utilized: None Level of assistance: Complete Independence Comments: decreased rotational motion at pelvis   TODAY'S TREATMENT:      [facility] Adult PT Treatment:                                                DATE: 12/31/22 Therapeutic Exercise: Nustep L4 8 min Runners step 6 in 10/10 - cues for upright  posture Lateral stepping 6 in 10/10 PKB 60s hold x2 with strap L hip flexor stretch 60s x2 Supine hip fallouts BluTB 15x2 B S/L clams 15x B BluTB POE  2 min SLR 2x10 Lt Staggered STS no UE LLE back 2x10   [facility] Adult PT Treatment:  DATE: 12/16/22 Therapeutic Exercise: Nustep L4 8 min PKB 60s hold x2 with strap L hip flexor stretch 30s x2 Supine hip fallouts BluTB 15x B S/L clams 15x B BluTB POE  2 min Re-assessment    [facility] Adult PT Treatment:                                                DATE: 11/25/22 Therapeutic Exercise: Nustep L3 8 min L hip flexor stretch 30s x2 Supine hip fallouts BluTB 15x B, 15/15 unilaterally Bridge against BluTB 15x S/L clams 15x B BluTB Bridge with ball 15x Runners step 6 in 10/10 Lateral stepping 6 in 10/10 POE  2 min    PATIENT EDUCATION:  Education details: Discussed eval findings, rehab rationale and POC and patient is in agreement  Person educated: Patient Education method: Explanation Education comprehension: verbalized understanding and needs further education  HOME EXERCISE PROGRAM: Access Code: [email or web address] URL: [email or web address] Date: 10/12/2022 Prepared by: Gustavus Bryant  Exercises - Bridge  - 2 x daily - 5 x weekly - 1 sets - 10 reps - Sit to Stand with Arms Crossed  - 2 x daily - 5 x weekly - 1 sets - 5 reps - Clamshell  - 2 x daily - 5 x weekly - 1 sets - 10 reps - Modified Thomas Stretch  - 2 x daily - 5 x weekly - 1 sets - 2 reps - 30s hold  ASSESSMENT:  CLINICAL IMPRESSION:  Patient presents to PT reporting continued improvements in his Lt hip pain and that he has been walking around more recently. Encouraged patient to perform HEP, particularly Lt hip flexor stretch. Session today continued to focus on proximal hip strengthening and stretching. He favors the RLE with STS, but is able to correct when in staggered stance. Patient was able to tolerate all  prescribed exercises with no adverse effects. Patient continues to benefit from skilled PT services and should be progressed as able to improve functional independence.    EVAL: Patient is a 62 y.o. male who was seen today for physical therapy evaluation and treatment for L hip ROM and strength deficits following THA on 09/21/22 via anterior approach.  He has little to no pain to address.  He has returned to I ambulation w/o need of AD.  Primary focus of OPPT will be strengthening and regaining L hip extension ROM.    OBJECTIVE IMPAIRMENTS: Abnormal gait, decreased activity tolerance, decreased endurance, decreased knowledge of condition, decreased mobility, difficulty walking, decreased ROM, decreased strength, improper body mechanics, and obesity.   ACTIVITY LIMITATIONS: carrying, lifting, squatting, stairs, and locomotion level  PERSONAL FACTORS: Age, Fitness, Past/current experiences, and 1 comorbidity: concurrent R THA  are also affecting patient's functional outcome.   REHAB POTENTIAL: Good  CLINICAL DECISION MAKING: Stable/uncomplicated  EVALUATION COMPLEXITY: Low   GOALS: Goals reviewed with patient? No  SHORT TERM GOALS=LONG TERM GOALS: Target date: 02/15/2023   Patient to demonstrate independence in HEP  Baseline: [id number] Goal status: Ongoing  2.  Increase L hip extension to 5d Baseline: -5d extension Goal status: Ongoing  3.  Increase L hip strength to 4/5 Baseline:  MMT Right eval Left eval L 12/16/22  Hip flexion  3+ 4-  Hip extension  3+ 4-  Hip abduction  3 4-(3 gluteus medius)   Goal status: Ongoing  4.  Decrease 5x STS  time to <15s arms crossed Baseline: 18s arms crossed; 12/16/22 13s  Goal status: MET   PLAN:  PT FREQUENCY: 1x/week  PT DURATION: 2 weeks  PLANNED INTERVENTIONS: Therapeutic exercises, Therapeutic activity, Neuromuscular re-education, Balance training, Gait training, Patient/Family education, Self Care, Joint mobilization, Stair  training, Dry Needling, Electrical stimulation, Cryotherapy, Moist heat, Manual therapy, and Re-evaluation  PLAN FOR NEXT SESSION: HEP review and update, manual techniques as appropriate, aerobic tasks, ROM and flexibility activities, strengthening and PREs, TPDN, gait and balance training as needed     Berta Minor, PTA 12/31/2022, 12:06 PM   Check all possible CPT codes: 82956 - PT Re-evaluation, 97110- Therapeutic Exercise, [phone]- Neuro Re-education, [phone] - Gait Training, [phone] - Manual Therapy, 97530 - Therapeutic Activities, and 97535 - Self Care    Check all conditions that are expected to impact treatment: {Conditions expected to impact treatment:Musculoskeletal disorders and Contractures, spasticity or fracture relevant to requested treatment   If treatment provided at initial evaluation, no treatment charged due to lack of authorization.

## 2023-01-06 NOTE — Therapy (Deleted)
OUTPATIENT PHYSICAL THERAPY TREATMENT NOTE   Patient Name: Jonathan Carney MRN: [id number] DOB:December 10, 1960, 62 y.o., male Today's Date: 01/06/2023  END OF SESSION:    Past Medical History:  Diagnosis Date   Arthritis    Borderline high cholesterol    Depression    GERD (gastroesophageal reflux disease)    occais, no meds   Hypertension    Smoker    Past Surgical History:  Procedure Laterality Date   ANTERIOR CERVICAL DECOMP/DISCECTOMY FUSION N/A 04/08/2018   Procedure: Cervical Five-Six, Cervical Six-Seven Anterior cervical decompression/discectomy/fusion;  Surgeon: Tia Alert, MD;  Location: [facility] OR;  Service: Neurosurgery;  Laterality: N/A;  anterior   EYE SURGERY  to straighten it out   Right   I & D EXTREMITY Right 05/30/2020   Procedure: IRRIGATION AND DEBRIDEMENT OF RIGHT SMALL  FINGER;  Surgeon: Ernest Mallick, MD;  Location: [facility] OR;  Service: Orthopedics;  Laterality: Right;   INCISION AND DRAINAGE OF WOUND Right 06/17/2020   Procedure: Right small finger irrigation and debridement, application of integra;  Surgeon: Ernest Mallick, MD;  Location: [facility];  Service: Orthopedics;  Laterality: Right;  5TH   TOOTH EXTRACTION N/A 06/05/2022   Procedure: EXTRACTION TEETH NUMBER THREE, FOUR, FIVE, SIX, SEVEN, EIGHT, NINE, TEN, ELEVEN, FIFTEEN, SIXTEEN, TWENTY, TWENTY-ONE, TWENTY-TWO, TWENTY-THREE, TWENTY-SEVEN, THIRTY TWO, ALVEOLOPLASTY, AND REMOVAL OF BILATERAL LINGUAL TORI;  Surgeon: Ocie Doyne, DMD;  Location: [facility] OR;  Service: Oral Surgery;  Laterality: N/A;   TOTAL HIP ARTHROPLASTY Right 07/29/2022   Procedure: TOTAL HIP ARTHROPLASTY;  Surgeon: Joen Laura, MD;  Location: [facility] ORS;  Service: Orthopedics;  Laterality: Right;   TOTAL HIP ARTHROPLASTY Left 09/21/2022   Procedure: TOTAL HIP ARTHROPLASTY;  Surgeon: Joen Laura, MD;  Location: [facility] ORS;  Service: Orthopedics;  Laterality: Left;   Patient Active Problem List   Diagnosis  Date Noted   Cellulitis of hand 05/29/2020   S/P cervical spinal fusion 04/08/2018    PCP: Fleet Contras, MD   REFERRING PROVIDER: Joen Laura, MD  REFERRING DIAG: s/p L THA  7/15  THERAPY DIAG:  No diagnosis found.  Rationale for Evaluation and Treatment: Rehabilitation  ONSET DATE: 09/21/2022  SUBJECTIVE:   SUBJECTIVE STATEMENT:  Patient reports continued improvement in his Lt hip pain, rating 3/10 today.   PERTINENT HISTORY: 62 yo male presents to therapy s/p L THA, posterior lateral approach on 08/22/2022 due to failure of conservative measures. Pt is L LE WBAT and no formal hip precautions. Pt PMH includes but is not limited to: cervical spine fusion, cellulitis of hand,HTN, tobacco abuse and R THA, posterior lateral approach on 07/29/2022.  PAIN:  Are you having pain? Yes: NPRS scale: 3/10 Pain location: L hip Pain description: ache Aggravating factors: stepping Relieving factors: rest  PRECAUTIONS: Anterior hip  RED FLAGS: None   WEIGHT BEARING RESTRICTIONS: No  FALLS:  Has patient fallen in last 6 months? No  OCCUPATION: not working  PLOF: Independent  PATIENT GOALS: To return to fishing  NEXT MD VISIT: 3-4 weeks  OBJECTIVE:   DIAGNOSTIC FINDINGS:  Narrative & Impression  CLINICAL DATA:  Left hip arthroplasty.   EXAM: DG HIP (WITH OR WITHOUT PELVIS) 2-3V LEFT   COMPARISON:  09/21/2022   FINDINGS: Two views of the left hip were obtained. Left total hip arthroplasty is located without a periprosthetic fracture. Right hip arthroplasty appears located. Visualized pelvic bony ring is intact.   IMPRESSION: Left hip arthroplasty without complicating features.  Electronically Signed   By: Richarda Overlie M.D.   On: 09/21/2022 12:15      PATIENT SURVEYS:  FOTO 85(79 predicted)  MUSCLE LENGTH: Hamstrings: Right 90 deg; Left 90 deg  POSTURE:  slightly flexed at hips   LOWER EXTREMITY ROM:  Active ROM Right eval Left eval  L 12/16/22  Hip flexion  100d 110d  Hip extension  -5d -5d  Hip abduction  30d 30d  Hip adduction     Hip internal rotation     Hip external rotation     Knee flexion     Knee extension     Ankle dorsiflexion     Ankle plantarflexion     Ankle inversion     Ankle eversion      (Blank rows = not tested)  LOWER EXTREMITY MMT:  MMT Right eval Left eval L 12/16/22  Hip flexion  3+ 4-  Hip extension  3+ 4-  Hip abduction  3 4-(3 gluteus medius)  Hip adduction     Hip internal rotation     Hip external rotation     Knee flexion     Knee extension     Ankle dorsiflexion     Ankle plantarflexion     Ankle inversion     Ankle eversion      (Blank rows = not tested)  LOWER EXTREMITY SPECIAL TESTS:  deferred  FUNCTIONAL TESTS:  5 times sit to stand: 18s arms crossed 12/16/22 13s  GAIT: Distance walked: 22ftx2 Assistive device utilized: None Level of assistance: Complete Independence Comments: decreased rotational motion at pelvis   TODAY'S TREATMENT:      [facility] Adult PT Treatment:                                                DATE: 12/31/22 Therapeutic Exercise: Nustep L4 8 min Runners step 6 in 10/10 - cues for upright posture Lateral stepping 6 in 10/10 PKB 60s hold x2 with strap L hip flexor stretch 60s x2 Supine hip fallouts BluTB 15x2 B S/L clams 15x B BluTB POE  2 min SLR 2x10 Lt Staggered STS no UE LLE back 2x10   [facility] Adult PT Treatment:                                                DATE: 12/16/22 Therapeutic Exercise: Nustep L4 8 min PKB 60s hold x2 with strap L hip flexor stretch 30s x2 Supine hip fallouts BluTB 15x B S/L clams 15x B BluTB POE  2 min Re-assessment    [facility] Adult PT Treatment:                                                DATE: 11/25/22 Therapeutic Exercise: Nustep L3 8 min L hip flexor stretch 30s x2 Supine hip fallouts BluTB 15x B, 15/15 unilaterally Bridge against BluTB 15x S/L clams 15x B BluTB Bridge with ball  15x Runners step 6 in 10/10 Lateral stepping 6 in 10/10 POE  2 min    PATIENT EDUCATION:  Education details: Discussed eval findings, rehab rationale  and POC and patient is in agreement  Person educated: Patient Education method: Explanation Education comprehension: verbalized understanding and needs further education  HOME EXERCISE PROGRAM: Access Code: [email or web address] URL: [email or web address] Date: 10/12/2022 Prepared by: Gustavus Bryant  Exercises - Bridge  - 2 x daily - 5 x weekly - 1 sets - 10 reps - Sit to Stand with Arms Crossed  - 2 x daily - 5 x weekly - 1 sets - 5 reps - Clamshell  - 2 x daily - 5 x weekly - 1 sets - 10 reps - Modified Thomas Stretch  - 2 x daily - 5 x weekly - 1 sets - 2 reps - 30s hold  ASSESSMENT:  CLINICAL IMPRESSION:  Patient presents to PT reporting continued improvements in his Lt hip pain and that he has been walking around more recently. Encouraged patient to perform HEP, particularly Lt hip flexor stretch. Session today continued to focus on proximal hip strengthening and stretching. He favors the RLE with STS, but is able to correct when in staggered stance. Patient was able to tolerate all prescribed exercises with no adverse effects. Patient continues to benefit from skilled PT services and should be progressed as able to improve functional independence.    EVAL: Patient is a 62 y.o. male who was seen today for physical therapy evaluation and treatment for L hip ROM and strength deficits following THA on 09/21/22 via anterior approach.  He has little to no pain to address.  He has returned to I ambulation w/o need of AD.  Primary focus of OPPT will be strengthening and regaining L hip extension ROM.    OBJECTIVE IMPAIRMENTS: Abnormal gait, decreased activity tolerance, decreased endurance, decreased knowledge of condition, decreased mobility, difficulty walking, decreased ROM, decreased strength, improper body mechanics, and obesity.    ACTIVITY LIMITATIONS: carrying, lifting, squatting, stairs, and locomotion level  PERSONAL FACTORS: Age, Fitness, Past/current experiences, and 1 comorbidity: concurrent R THA  are also affecting patient's functional outcome.   REHAB POTENTIAL: Good  CLINICAL DECISION MAKING: Stable/uncomplicated  EVALUATION COMPLEXITY: Low   GOALS: Goals reviewed with patient? No  SHORT TERM GOALS=LONG TERM GOALS: Target date: 02/15/2023   Patient to demonstrate independence in HEP  Baseline: [id number] Goal status: Ongoing  2.  Increase L hip extension to 5d Baseline: -5d extension Goal status: Ongoing  3.  Increase L hip strength to 4/5 Baseline:  MMT Right eval Left eval L 12/16/22  Hip flexion  3+ 4-  Hip extension  3+ 4-  Hip abduction  3 4-(3 gluteus medius)   Goal status: Ongoing  4.  Decrease 5x STS time to <15s arms crossed Baseline: 18s arms crossed; 12/16/22 13s  Goal status: MET   PLAN:  PT FREQUENCY: 1x/week  PT DURATION: 2 weeks  PLANNED INTERVENTIONS: Therapeutic exercises, Therapeutic activity, Neuromuscular re-education, Balance training, Gait training, Patient/Family education, Self Care, Joint mobilization, Stair training, Dry Needling, Electrical stimulation, Cryotherapy, Moist heat, Manual therapy, and Re-evaluation  PLAN FOR NEXT SESSION: HEP review and update, manual techniques as appropriate, aerobic tasks, ROM and flexibility activities, strengthening and PREs, TPDN, gait and balance training as needed     Hildred Laser, PT 01/06/2023, 5:40 PM   Check all possible CPT codes: 69629 - PT Re-evaluation, 97110- Therapeutic Exercise, [phone]- Neuro Re-education, [phone] - Gait Training, [phone] - Manual Therapy, 97530 - Therapeutic Activities, and 97535 - Self Care    Check all conditions that are expected to impact treatment: {Conditions expected to impact treatment:Musculoskeletal disorders and  Contractures, spasticity or fracture relevant to requested  treatment   If treatment provided at initial evaluation, no treatment charged due to lack of authorization.

## 2023-01-07 ENCOUNTER — Ambulatory Visit: Payer: Medicaid Other

## 2023-05-25 ENCOUNTER — Encounter (HOSPITAL_COMMUNITY): Payer: Self-pay

## 2023-05-25 ENCOUNTER — Emergency Department (HOSPITAL_COMMUNITY)
Admission: EM | Admit: 2023-05-25 | Discharge: 2023-05-25 | Discharge status: Against Medical Advice | Attending: Emergency Medicine | Admitting: Emergency Medicine

## 2023-05-25 DIAGNOSIS — R110 Nausea: Secondary | ICD-10-CM | POA: Insufficient documentation

## 2023-05-25 DIAGNOSIS — Z5321 Procedure and treatment not carried out due to patient leaving prior to being seen by health care provider: Secondary | ICD-10-CM | POA: Diagnosis not present

## 2023-05-25 DIAGNOSIS — R1031 Right lower quadrant pain: Secondary | ICD-10-CM | POA: Diagnosis present

## 2023-05-25 LAB — COMPREHENSIVE METABOLIC PANEL
ALT: 29 U/L (ref 0–44)
AST: 31 U/L (ref 15–41)
Albumin: 4 g/dL (ref 3.5–5.0)
Alkaline Phosphatase: 61 U/L (ref 38–126)
Anion gap: 4 — ABNORMAL LOW (ref 5–15)
BUN: 13 mg/dL (ref 8–23)
CO2: 26 mmol/L (ref 22–32)
Calcium: 8.9 mg/dL (ref 8.9–10.3)
Chloride: 107 mmol/L (ref 98–111)
Creatinine, Ser: 1.29 mg/dL — ABNORMAL HIGH (ref 0.61–1.24)
GFR, Estimated: >60 mL/min (ref >60)
Glucose, Bld: 118 mg/dL — ABNORMAL HIGH (ref 70–99)
Potassium: 4 mmol/L (ref 3.5–5.1)
Sodium: 137 mmol/L (ref 135–145)
Total Bilirubin: 0.7 mg/dL (ref 0.0–1.2)
Total Protein: 7.2 g/dL (ref 6.5–8.1)

## 2023-05-25 LAB — CBC
HCT: 44 % (ref 39.0–52.0)
Hemoglobin: 14.7 g/dL (ref 13.0–17.0)
MCH: 30.6 pg (ref 26.0–34.0)
MCHC: 33.4 g/dL (ref 30.0–36.0)
MCV: 91.7 fL (ref 80.0–100.0)
Platelets: 294 10*3/uL (ref 150–400)
RBC: 4.8 MIL/uL (ref 4.22–5.81)
RDW: 13.5 % (ref 11.5–15.5)
WBC: 7.2 10*3/uL (ref 4.0–10.5)
nRBC: 0 % (ref 0.0–0.2)

## 2023-05-25 LAB — URINALYSIS, ROUTINE W REFLEX MICROSCOPIC
Bacteria, UA: NONE SEEN
Bilirubin Urine: NEGATIVE
Glucose, UA: NEGATIVE mg/dL
Ketones, ur: NEGATIVE mg/dL
Leukocytes,Ua: NEGATIVE
Nitrite: NEGATIVE
Protein, ur: NEGATIVE mg/dL
Specific Gravity, Urine: 1.013 (ref 1.005–1.030)
pH: 6 (ref 5.0–8.0)

## 2023-05-25 LAB — LIPASE, BLOOD: Lipase: 27 U/L (ref 11–51)

## 2023-05-25 NOTE — ED Notes (Signed)
 Pt was stuck twice. Wasn't successful.

## 2023-05-25 NOTE — ED Notes (Signed)
Called pt for vitals, no answer 

## 2023-05-25 NOTE — ED Provider Triage Note (Signed)
 Emergency Medicine Provider Triage Evaluation Note  Jonathan Carney , a 63 y.o. male  was evaluated in triage.  Pt complains of right flank pain that began this morning.  Describes the pain as sharp with radiation around to right lower abdomen.  Patient also endorses nausea.  No pain at this time.  No history of kidney stones.  Had a large dinner last night and has taken several stool softener since pain began.  Review of Systems  Positive: Right flank pain radiating to right lower quadrant.  Nausea. Negative: Vomiting, fever, dysuria, hematuria, frequency, urgency  Physical Exam  BP (!) 162/97 (BP Location: Right Arm)   Pulse 82   Temp 98 F (36.7 C) (Oral)   Resp 15   Ht 6' (1.829 m)   Wt 110.2 kg   SpO2 95%   BMI 32.96 kg/m  Gen:   Awake, no distress   Resp:  Normal effort  MSK:   Moves extremities without difficulty  Other:  No flank pain or abdominal tenderness at this time.  Medical Decision Making  Medically screening exam initiated at 12:17 PM.  Appropriate orders placed.  Jonathan Carney was informed that the remainder of the evaluation will be completed by another provider, this initial triage assessment does not replace that evaluation, and the importance of remaining in the ED until their evaluation is complete.  Labs ordered   Jonathan Jenny, PA-C 05/25/23 1220

## 2023-05-25 NOTE — ED Triage Notes (Addendum)
 Per [facility], Pt, from home, c/o sharp R flank pain, generalized abdominal pain, and nausea starting this morning.  Currently, denies pain.    Pt reports he did not have his normal BM this morning and has taken several stool softeners.   Pt reports eating a very large dinner last night.

## 2023-06-17 IMAGING — DX DG CHEST 2V
2 series · 2 of 2 positions shown · non-contrast
Comparison: 08/24/2015

CLINICAL DATA: Productive cough x3 weeks with SOB, Current smoker

EXAM:
CHEST - 2 VIEW

[chest pa]
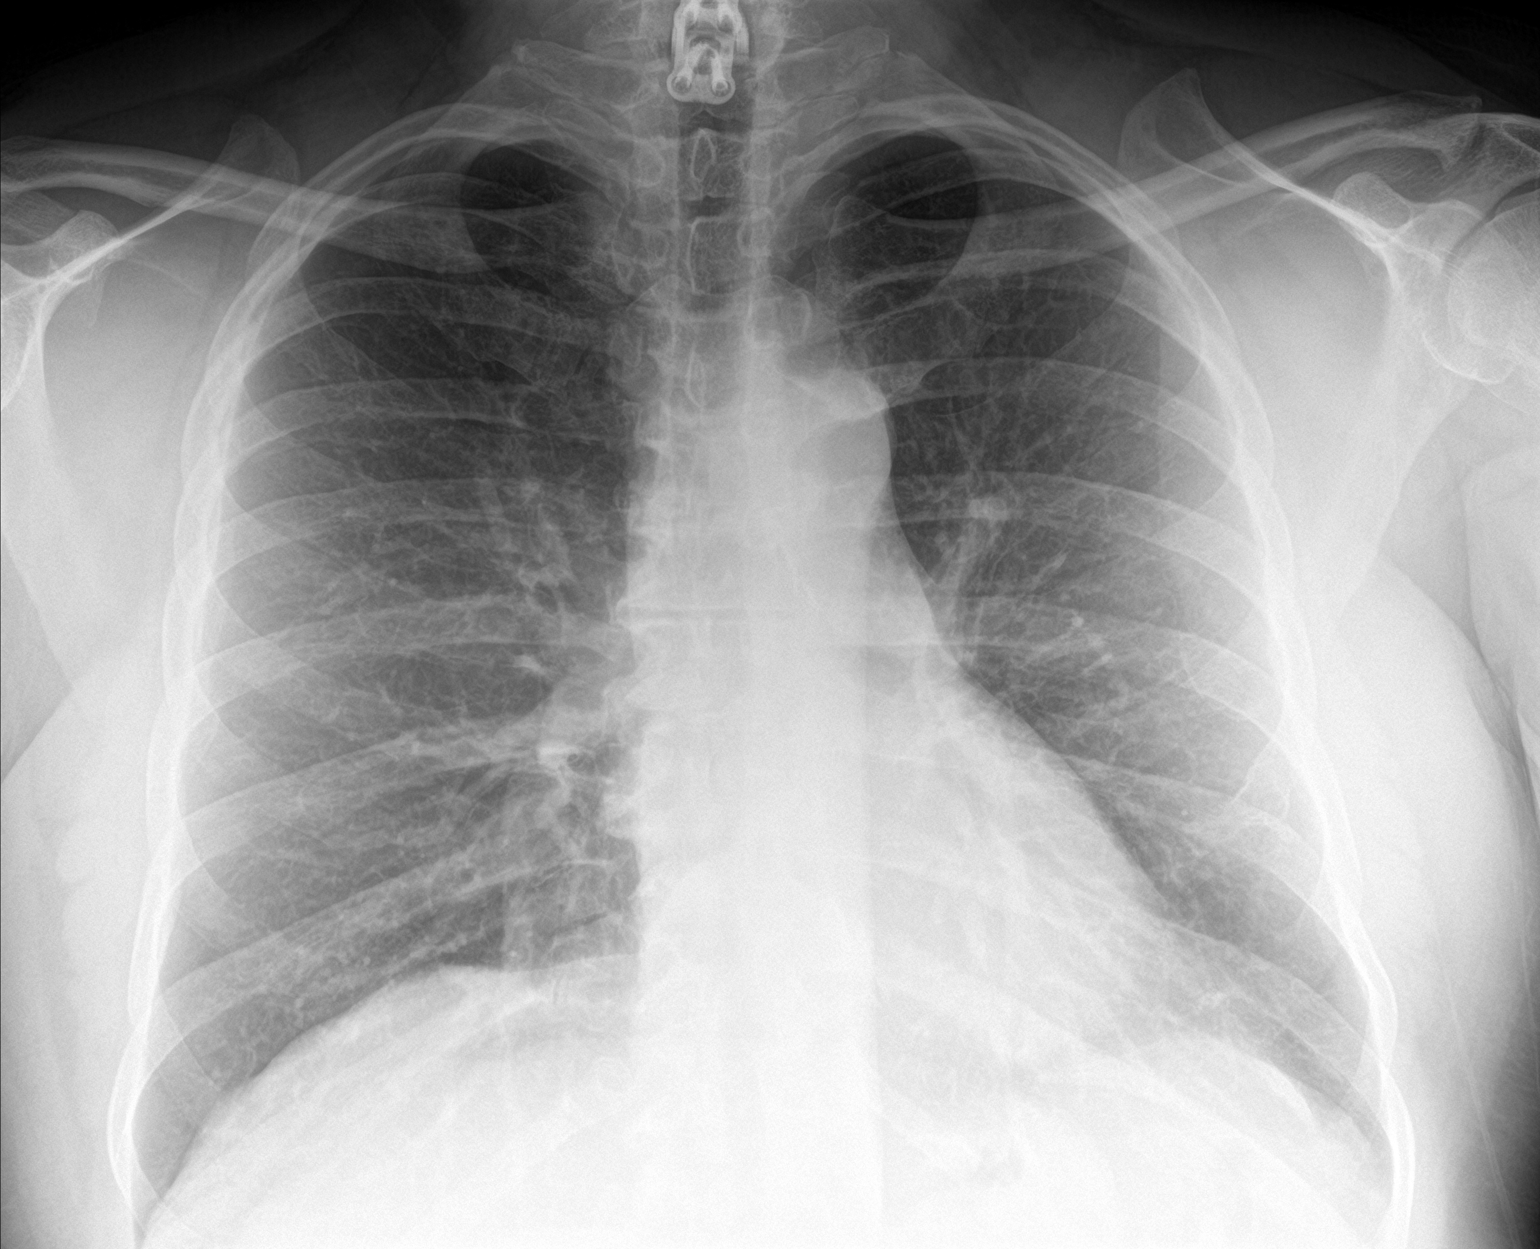

[chest lat]
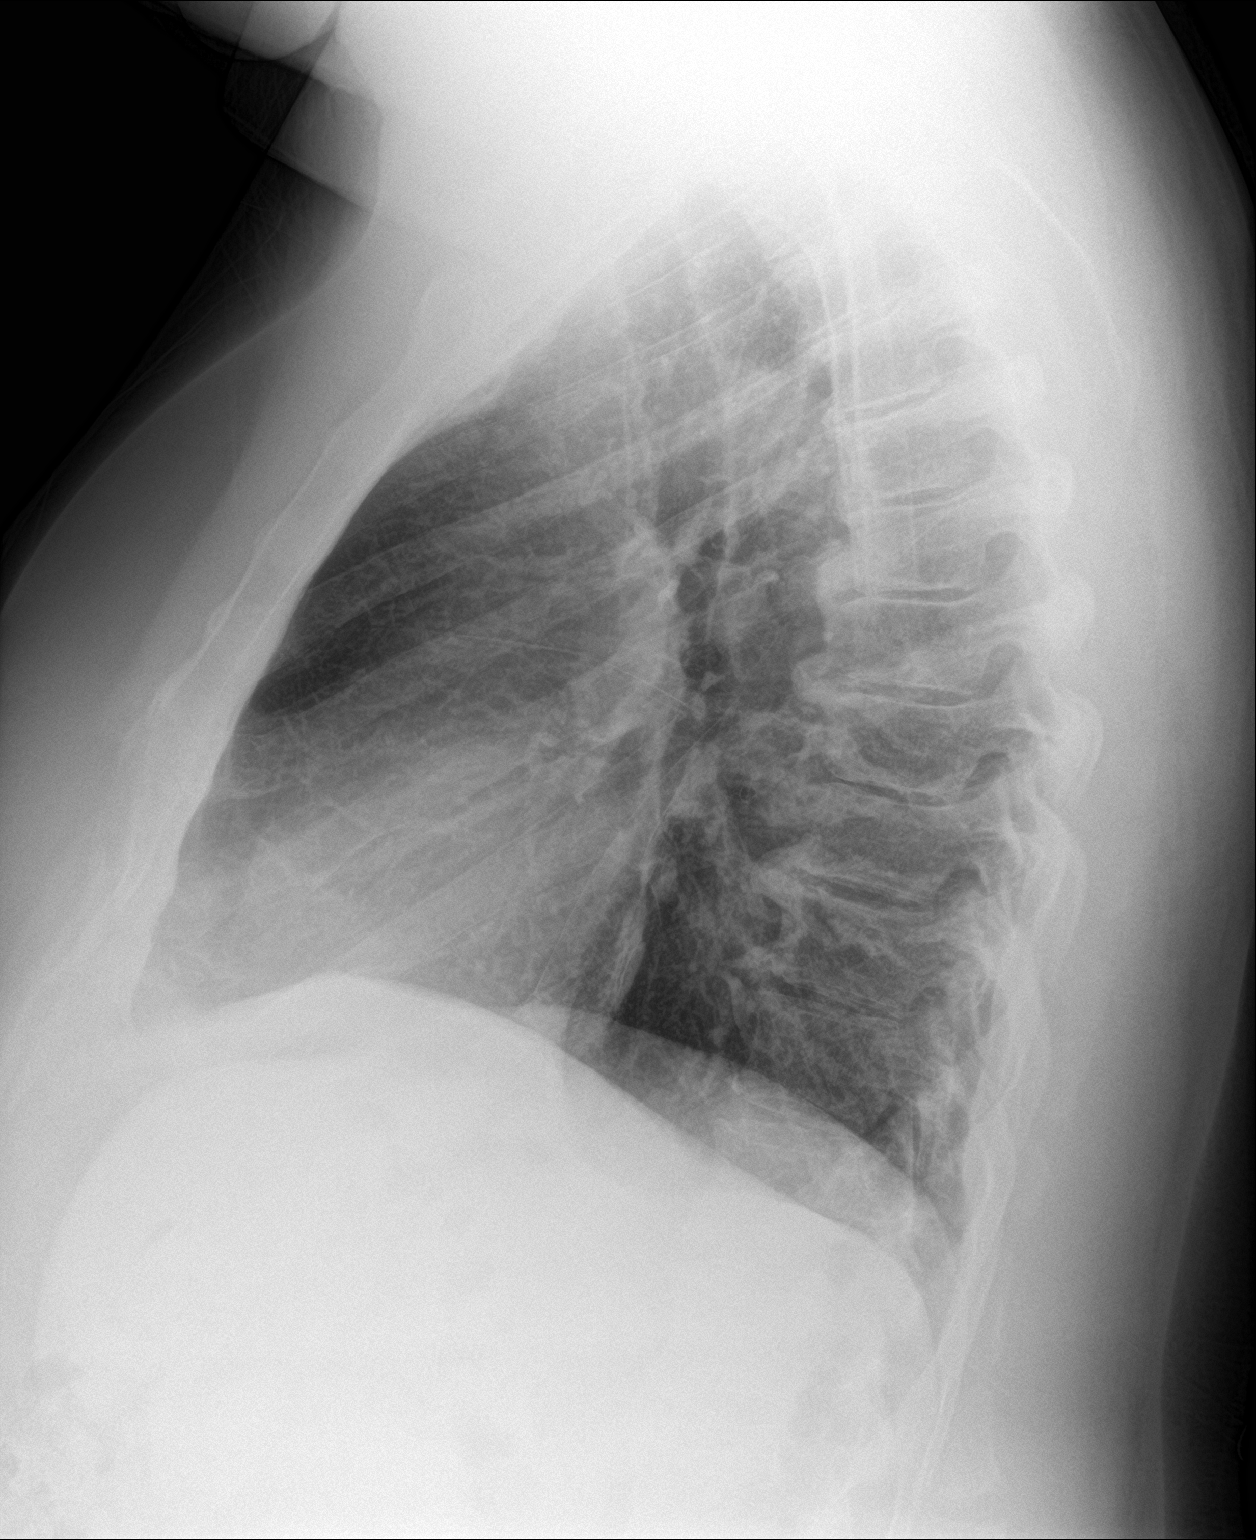

[2 of 2 positions shown; findings below may reference images not displayed]

FINDINGS: Lungs are clear.

Heart size and mediastinal contours are within normal limits.

No effusion.  No pneumothorax.

Cervical fixation hardware is partially seen. Anterior vertebral
endplate spurring at multiple levels in the mid and lower thoracic
spine.
IMPRESSION: No acute cardiopulmonary disease.

## 2023-10-19 IMAGING — DX DG CHEST 2V
2 series · 2 of 2 positions shown · non-contrast
Comparison: Chest x-ray 09/25/2020.

CLINICAL DATA: 60-year-old male with history of persistent cough
and chest congestion for 1 month.

EXAM:
CHEST - 2 VIEW

[chest pa]
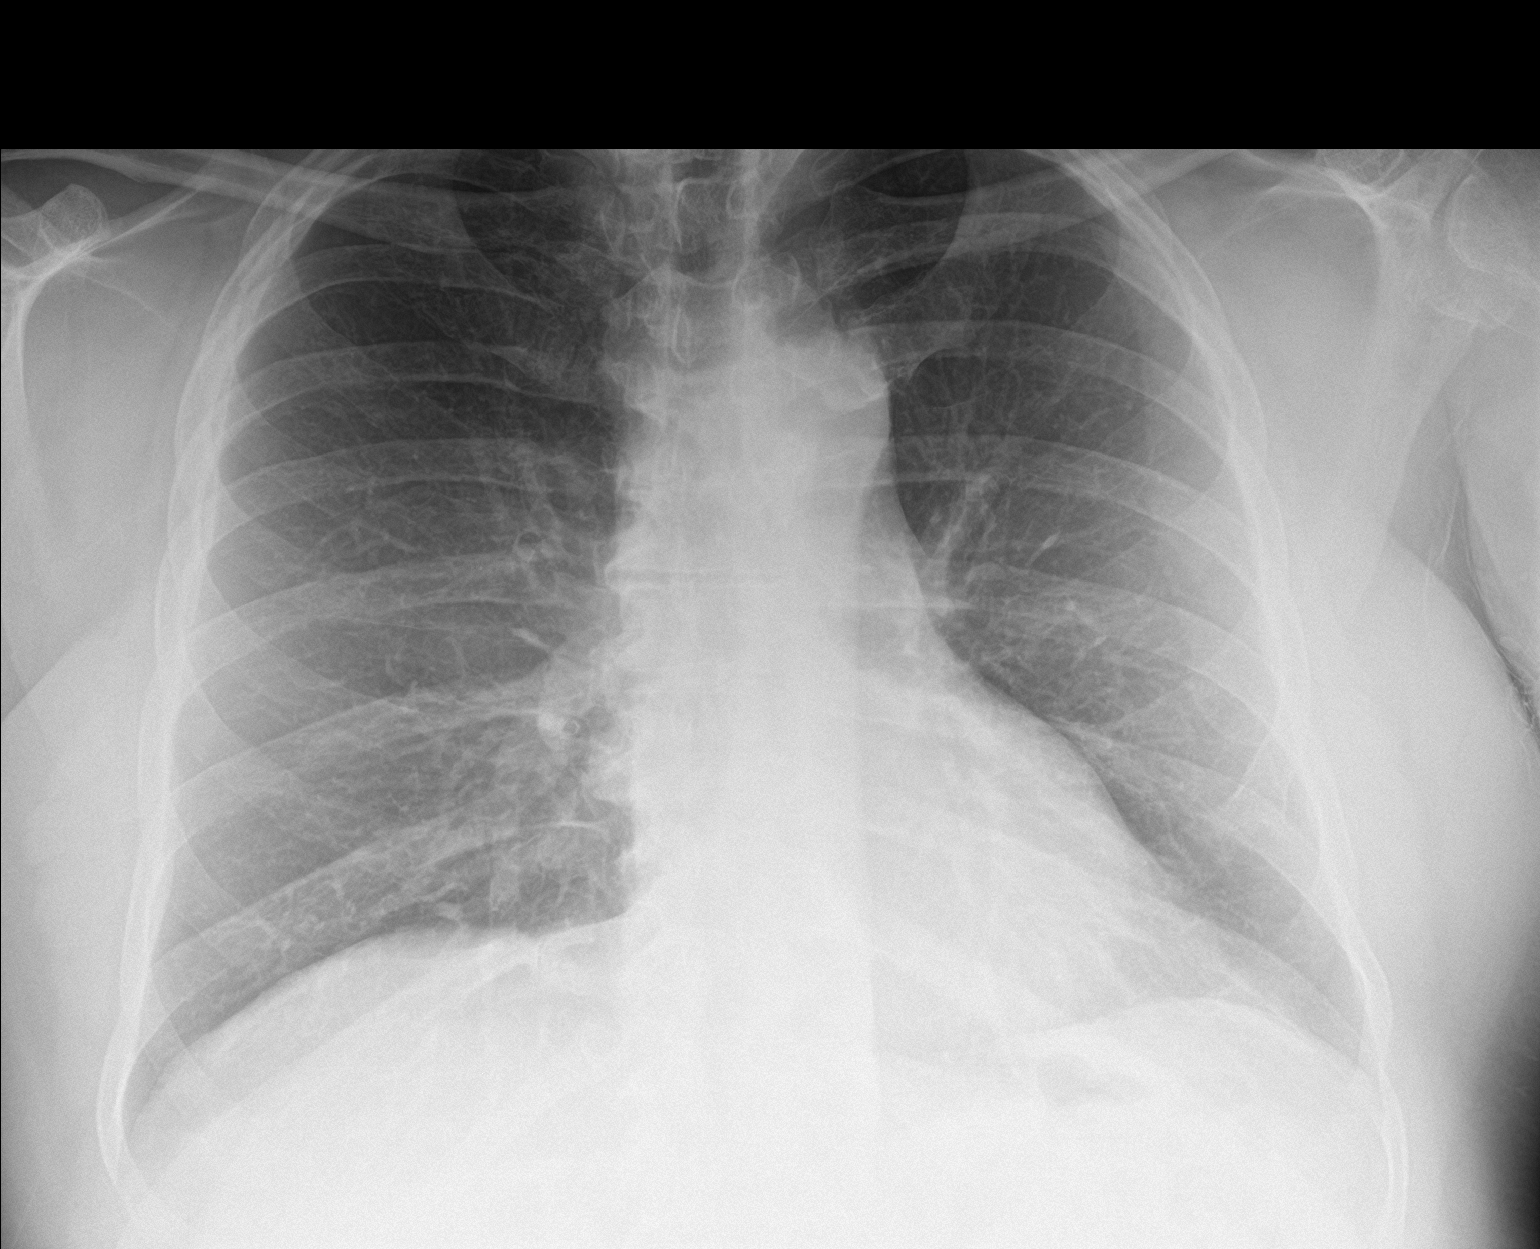

[chest lat]
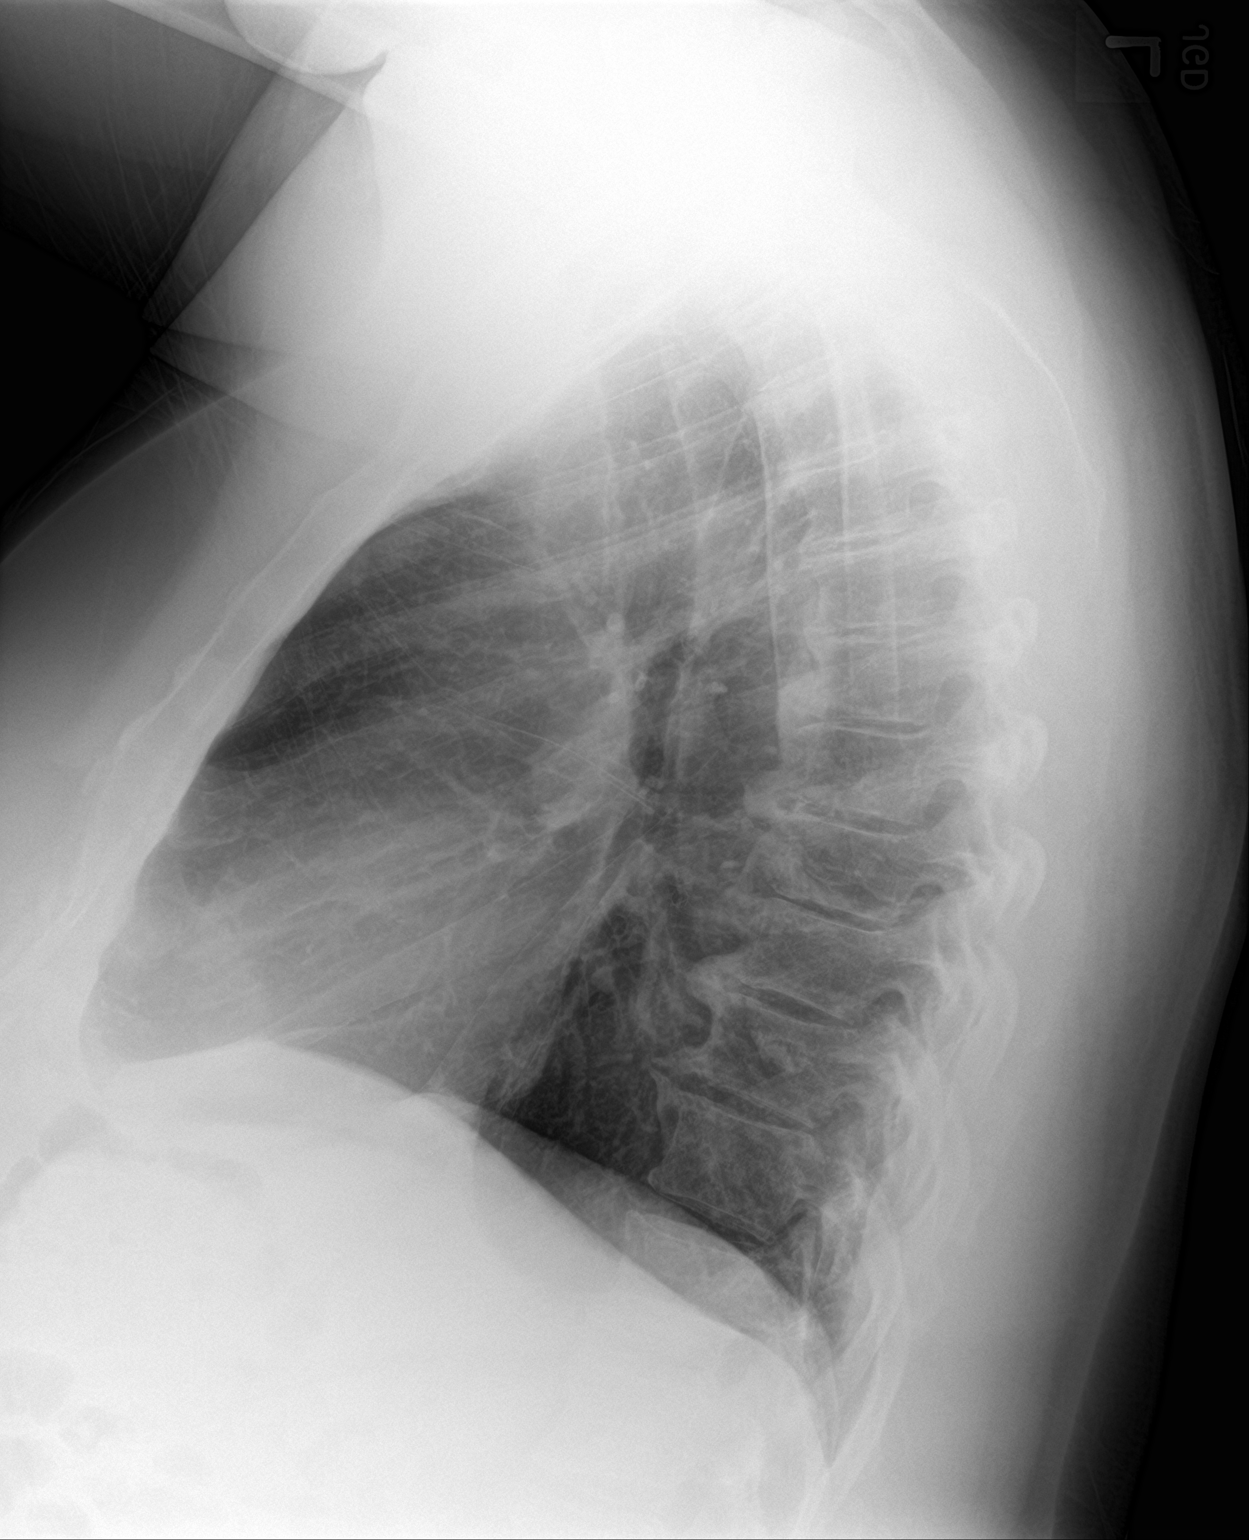

[2 of 2 positions shown; findings below may reference images not displayed]

FINDINGS: Lung volumes are normal. No consolidative airspace disease. No
pleural effusions. No pneumothorax. No pulmonary nodule or mass
noted. Pulmonary vasculature and the cardiomediastinal silhouette
are within normal limits. Orthopedic fixation hardware in the lower
cervical spine incidentally noted.
IMPRESSION: No radiographic evidence of acute cardiopulmonary disease.
